# Patient Record
Sex: Female | Born: 1960 | Race: White | Hispanic: No | Smoking: Never smoker
Health system: Southern US, Community
[De-identification: ages and names within clinical notes are randomized; demographics above are authoritative.]

## PROBLEM LIST (undated history)

## (undated) DIAGNOSIS — Z862 Personal history of diseases of the blood and blood-forming organs and certain disorders involving the immune mechanism: Secondary | ICD-10-CM

## (undated) DIAGNOSIS — Z9889 Other specified postprocedural states: Secondary | ICD-10-CM

## (undated) DIAGNOSIS — R439 Unspecified disturbances of smell and taste: Secondary | ICD-10-CM

## (undated) DIAGNOSIS — F419 Anxiety disorder, unspecified: Secondary | ICD-10-CM

## (undated) DIAGNOSIS — E785 Hyperlipidemia, unspecified: Secondary | ICD-10-CM

## (undated) DIAGNOSIS — M545 Low back pain, unspecified: Secondary | ICD-10-CM

## (undated) DIAGNOSIS — G894 Chronic pain syndrome: Secondary | ICD-10-CM

## (undated) DIAGNOSIS — F329 Major depressive disorder, single episode, unspecified: Secondary | ICD-10-CM

## (undated) DIAGNOSIS — Z8781 Personal history of (healed) traumatic fracture: Secondary | ICD-10-CM

## (undated) DIAGNOSIS — F32A Depression, unspecified: Secondary | ICD-10-CM

## (undated) DIAGNOSIS — G4733 Obstructive sleep apnea (adult) (pediatric): Secondary | ICD-10-CM

## (undated) DIAGNOSIS — F4310 Post-traumatic stress disorder, unspecified: Secondary | ICD-10-CM

## (undated) DIAGNOSIS — G4730 Sleep apnea, unspecified: Secondary | ICD-10-CM

## (undated) DIAGNOSIS — K219 Gastro-esophageal reflux disease without esophagitis: Secondary | ICD-10-CM

## (undated) DIAGNOSIS — J309 Allergic rhinitis, unspecified: Secondary | ICD-10-CM

## (undated) DIAGNOSIS — Z8541 Personal history of malignant neoplasm of cervix uteri: Secondary | ICD-10-CM

## (undated) DIAGNOSIS — Z9989 Dependence on other enabling machines and devices: Secondary | ICD-10-CM

## (undated) DIAGNOSIS — R112 Nausea with vomiting, unspecified: Secondary | ICD-10-CM

## (undated) DIAGNOSIS — R5382 Chronic fatigue, unspecified: Secondary | ICD-10-CM

## (undated) DIAGNOSIS — M797 Fibromyalgia: Secondary | ICD-10-CM

## (undated) DIAGNOSIS — Z800 Family history of malignant neoplasm of digestive organs: Secondary | ICD-10-CM

## (undated) DIAGNOSIS — N938 Other specified abnormal uterine and vaginal bleeding: Secondary | ICD-10-CM

## (undated) DIAGNOSIS — G8929 Other chronic pain: Secondary | ICD-10-CM

## (undated) DIAGNOSIS — M25531 Pain in right wrist: Secondary | ICD-10-CM

## (undated) DIAGNOSIS — M1990 Unspecified osteoarthritis, unspecified site: Secondary | ICD-10-CM

## (undated) DIAGNOSIS — R499 Unspecified voice and resonance disorder: Secondary | ICD-10-CM

## (undated) DIAGNOSIS — E669 Obesity, unspecified: Secondary | ICD-10-CM

## (undated) DIAGNOSIS — G629 Polyneuropathy, unspecified: Secondary | ICD-10-CM

## (undated) DIAGNOSIS — I10 Essential (primary) hypertension: Secondary | ICD-10-CM

## (undated) DIAGNOSIS — N3941 Urge incontinence: Secondary | ICD-10-CM

## (undated) DIAGNOSIS — E8881 Metabolic syndrome: Secondary | ICD-10-CM

## (undated) DIAGNOSIS — G5601 Carpal tunnel syndrome, right upper limb: Secondary | ICD-10-CM

## (undated) DIAGNOSIS — D649 Anemia, unspecified: Secondary | ICD-10-CM

## (undated) DIAGNOSIS — R109 Unspecified abdominal pain: Secondary | ICD-10-CM

## (undated) DIAGNOSIS — C801 Malignant (primary) neoplasm, unspecified: Secondary | ICD-10-CM

## (undated) DIAGNOSIS — G43909 Migraine, unspecified, not intractable, without status migrainosus: Secondary | ICD-10-CM

## (undated) HISTORY — DX: Chronic fatigue, unspecified: R53.82

## (undated) HISTORY — DX: Unspecified voice and resonance disorder: R49.9

## (undated) HISTORY — DX: Malignant (primary) neoplasm, unspecified: C80.1

## (undated) HISTORY — DX: Metabolic syndrome: E88.81

## (undated) HISTORY — DX: Other chronic pain: G89.29

## (undated) HISTORY — DX: Polyneuropathy, unspecified: G62.9

## (undated) HISTORY — DX: Personal history of (healed) traumatic fracture: Z87.81

## (undated) HISTORY — DX: Essential (primary) hypertension: I10

## (undated) HISTORY — DX: Unspecified disturbances of smell and taste: R43.9

## (undated) HISTORY — DX: Major depressive disorder, single episode, unspecified: F32.9

## (undated) HISTORY — DX: Family history of malignant neoplasm of digestive organs: Z80.0

## (undated) HISTORY — DX: Low back pain, unspecified: M54.50

## (undated) HISTORY — DX: Dependence on other enabling machines and devices: Z99.89

## (undated) HISTORY — DX: Personal history of diseases of the blood and blood-forming organs and certain disorders involving the immune mechanism: Z86.2

## (undated) HISTORY — PX: OTHER SURGICAL HISTORY: SHX169

## (undated) HISTORY — DX: Obstructive sleep apnea (adult) (pediatric): G47.33

## (undated) HISTORY — DX: Other specified abnormal uterine and vaginal bleeding: N93.8

## (undated) HISTORY — DX: Low back pain: M54.5

## (undated) HISTORY — DX: Hyperlipidemia, unspecified: E78.5

## (undated) HISTORY — DX: Personal history of malignant neoplasm of cervix uteri: Z85.41

## (undated) HISTORY — DX: Depression, unspecified: F32.A

## (undated) HISTORY — PX: REPAIR QUADRICEPS / HAMSTRING MUSCLE: SUR1204

## (undated) HISTORY — PX: DIAGNOSTIC LAPAROSCOPY: SUR761

## (undated) HISTORY — DX: Allergic rhinitis, unspecified: J30.9

## (undated) HISTORY — PX: LUMBAR SPINE SURGERY: SHX701

## (undated) HISTORY — DX: Chronic pain syndrome: G89.4

## (undated) HISTORY — DX: Unspecified abdominal pain: R10.9

## (undated) HISTORY — PX: BACK SURGERY: SHX140

## (undated) HISTORY — DX: Obesity, unspecified: E66.9

## (undated) HISTORY — PX: WRIST SURGERY: SHX841

## (undated) HISTORY — DX: Gastro-esophageal reflux disease without esophagitis: K21.9

## (undated) HISTORY — DX: Fibromyalgia: M79.7

## (undated) HISTORY — PX: DILATION AND CURETTAGE OF UTERUS: SHX78

## (undated) HISTORY — DX: Migraine, unspecified, not intractable, without status migrainosus: G43.909

## (undated) HISTORY — DX: Sleep apnea, unspecified: G47.30

## (undated) HISTORY — DX: Pain in right wrist: M25.531

## (undated) HISTORY — DX: Urge incontinence: N39.41

## (undated) HISTORY — DX: Post-traumatic stress disorder, unspecified: F43.10

## (undated) HISTORY — DX: Anxiety disorder, unspecified: F41.9

## (undated) HISTORY — PX: UPPER GI ENDOSCOPY: SHX6162

---

## 1988-04-03 HISTORY — PX: KNEE ARTHROSCOPY: SUR90

## 1991-05-05 HISTORY — PX: CERVICAL CONE BIOPSY: SUR198

## 1997-05-04 HISTORY — PX: NASAL SEPTUM SURGERY: SHX37

## 1997-10-09 ENCOUNTER — Other Ambulatory Visit: Admission: RE | Admit: 1997-10-09 | Discharge: 1997-10-09 | Payer: Self-pay | Admitting: Otolaryngology

## 1998-11-18 ENCOUNTER — Ambulatory Visit (HOSPITAL_COMMUNITY): Admission: RE | Admit: 1998-11-18 | Discharge: 1998-11-18 | Payer: Self-pay | Admitting: Obstetrics and Gynecology

## 1999-03-18 ENCOUNTER — Other Ambulatory Visit: Admission: RE | Admit: 1999-03-18 | Discharge: 1999-03-18 | Payer: Self-pay | Admitting: Obstetrics and Gynecology

## 1999-11-25 ENCOUNTER — Other Ambulatory Visit: Admission: RE | Admit: 1999-11-25 | Discharge: 1999-11-25 | Payer: Self-pay | Admitting: Obstetrics and Gynecology

## 2000-05-31 ENCOUNTER — Other Ambulatory Visit: Admission: RE | Admit: 2000-05-31 | Discharge: 2000-05-31 | Payer: Self-pay | Admitting: *Deleted

## 2000-11-29 ENCOUNTER — Other Ambulatory Visit: Admission: RE | Admit: 2000-11-29 | Discharge: 2000-11-29 | Payer: Self-pay | Admitting: Obstetrics and Gynecology

## 2002-02-07 ENCOUNTER — Ambulatory Visit (HOSPITAL_BASED_OUTPATIENT_CLINIC_OR_DEPARTMENT_OTHER): Admission: RE | Admit: 2002-02-07 | Discharge: 2002-02-07 | Payer: Self-pay | Admitting: *Deleted

## 2002-02-07 ENCOUNTER — Encounter: Payer: Self-pay | Admitting: Pulmonary Disease

## 2002-06-20 ENCOUNTER — Other Ambulatory Visit: Admission: RE | Admit: 2002-06-20 | Discharge: 2002-06-20 | Payer: Self-pay | Admitting: Obstetrics and Gynecology

## 2003-05-30 ENCOUNTER — Ambulatory Visit (HOSPITAL_COMMUNITY): Admission: RE | Admit: 2003-05-30 | Discharge: 2003-05-30 | Payer: Self-pay | Admitting: Chiropractor

## 2003-06-14 ENCOUNTER — Encounter: Payer: Self-pay | Admitting: Internal Medicine

## 2003-09-02 HISTORY — PX: MICRODISCECTOMY LUMBAR: SUR864

## 2003-09-06 ENCOUNTER — Ambulatory Visit (HOSPITAL_COMMUNITY): Admission: RE | Admit: 2003-09-06 | Discharge: 2003-09-07 | Payer: Self-pay | Admitting: Orthopaedic Surgery

## 2004-05-04 HISTORY — PX: OTHER SURGICAL HISTORY: SHX169

## 2004-12-04 ENCOUNTER — Ambulatory Visit: Payer: Self-pay | Admitting: Internal Medicine

## 2004-12-17 ENCOUNTER — Ambulatory Visit: Payer: Self-pay | Admitting: Internal Medicine

## 2005-02-11 ENCOUNTER — Ambulatory Visit: Payer: Self-pay | Admitting: Internal Medicine

## 2005-03-10 ENCOUNTER — Ambulatory Visit: Payer: Self-pay | Admitting: Internal Medicine

## 2005-03-12 ENCOUNTER — Encounter (INDEPENDENT_AMBULATORY_CARE_PROVIDER_SITE_OTHER): Payer: Self-pay | Admitting: Specialist

## 2005-03-12 ENCOUNTER — Ambulatory Visit (HOSPITAL_COMMUNITY): Admission: RE | Admit: 2005-03-12 | Discharge: 2005-03-12 | Payer: Self-pay | Admitting: Surgery

## 2005-04-21 ENCOUNTER — Ambulatory Visit: Payer: Self-pay | Admitting: Internal Medicine

## 2005-05-04 HISTORY — PX: ANAL SPHINCTEROTOMY: SHX1140

## 2005-07-10 ENCOUNTER — Ambulatory Visit (HOSPITAL_COMMUNITY): Admission: RE | Admit: 2005-07-10 | Discharge: 2005-07-11 | Payer: Self-pay | Admitting: Surgery

## 2005-09-14 ENCOUNTER — Ambulatory Visit: Payer: Self-pay | Admitting: Gastroenterology

## 2005-10-05 ENCOUNTER — Ambulatory Visit: Payer: Self-pay | Admitting: Gastroenterology

## 2005-11-06 ENCOUNTER — Ambulatory Visit: Payer: Self-pay | Admitting: Gastroenterology

## 2006-01-22 ENCOUNTER — Ambulatory Visit: Payer: Self-pay | Admitting: Internal Medicine

## 2006-02-26 ENCOUNTER — Ambulatory Visit: Payer: Self-pay | Admitting: Internal Medicine

## 2006-03-04 ENCOUNTER — Ambulatory Visit: Payer: Self-pay | Admitting: Internal Medicine

## 2006-03-04 LAB — CONVERTED CEMR LAB
Basophils Absolute: 0.1 10*3/uL (ref 0.0–0.1)
Basophils Relative: 1.2 % — ABNORMAL HIGH (ref 0.0–1.0)
Eosinophil percent: 2.1 % (ref 0.0–5.0)
Free T4: 0.8 ng/dL — ABNORMAL LOW (ref 0.9–1.8)
HCT: 40.1 % (ref 36.0–46.0)
Hemoglobin: 13.4 g/dL (ref 12.0–15.0)
Hgb A1c MFr Bld: 5.1 % (ref 4.6–6.0)
Lymphocytes Relative: 27.1 % (ref 12.0–46.0)
MCHC: 33.3 g/dL (ref 30.0–36.0)
MCV: 85 fL (ref 78.0–100.0)
Monocytes Absolute: 0.6 10*3/uL (ref 0.2–0.7)
Monocytes Relative: 7.7 % (ref 3.0–11.0)
Neutro Abs: 4.3 10*3/uL (ref 1.4–7.7)
Neutrophils Relative %: 61.9 % (ref 43.0–77.0)
Platelets: 383 10*3/uL (ref 150–400)
RBC: 4.72 M/uL (ref 3.87–5.11)
RDW: 13.2 % (ref 11.5–14.6)
T3, Free: 3.3 pg/mL (ref 2.3–4.2)
TSH: 0.62 microintl units/mL (ref 0.35–5.50)
Vitamin B-12: 386 pg/mL (ref 211–911)
WBC: 7.2 10*3/uL (ref 4.5–10.5)

## 2006-09-09 ENCOUNTER — Encounter: Payer: Self-pay | Admitting: Internal Medicine

## 2006-09-09 ENCOUNTER — Ambulatory Visit: Payer: Self-pay | Admitting: Internal Medicine

## 2006-09-09 LAB — CONVERTED CEMR LAB: Rapid Strep: POSITIVE

## 2007-06-30 ENCOUNTER — Ambulatory Visit: Payer: Self-pay | Admitting: Internal Medicine

## 2007-09-23 ENCOUNTER — Telehealth (INDEPENDENT_AMBULATORY_CARE_PROVIDER_SITE_OTHER): Payer: Self-pay | Admitting: *Deleted

## 2007-09-23 ENCOUNTER — Ambulatory Visit: Payer: Self-pay | Admitting: Internal Medicine

## 2007-09-23 DIAGNOSIS — K219 Gastro-esophageal reflux disease without esophagitis: Secondary | ICD-10-CM | POA: Insufficient documentation

## 2007-09-23 DIAGNOSIS — I10 Essential (primary) hypertension: Secondary | ICD-10-CM | POA: Insufficient documentation

## 2007-09-27 ENCOUNTER — Encounter (INDEPENDENT_AMBULATORY_CARE_PROVIDER_SITE_OTHER): Payer: Self-pay | Admitting: *Deleted

## 2007-10-04 ENCOUNTER — Encounter: Payer: Self-pay | Admitting: Internal Medicine

## 2007-11-11 ENCOUNTER — Ambulatory Visit: Payer: Self-pay | Admitting: Internal Medicine

## 2008-03-04 HISTORY — PX: COMBINED HYSTEROSCOPY DIAGNOSTIC / D&C: SUR297

## 2008-03-23 ENCOUNTER — Ambulatory Visit (HOSPITAL_COMMUNITY): Admission: RE | Admit: 2008-03-23 | Discharge: 2008-03-23 | Payer: Self-pay | Admitting: Obstetrics and Gynecology

## 2008-03-23 ENCOUNTER — Encounter (INDEPENDENT_AMBULATORY_CARE_PROVIDER_SITE_OTHER): Payer: Self-pay | Admitting: Obstetrics and Gynecology

## 2008-07-05 ENCOUNTER — Encounter: Payer: Self-pay | Admitting: Internal Medicine

## 2008-08-14 ENCOUNTER — Encounter: Payer: Self-pay | Admitting: Internal Medicine

## 2008-08-14 DIAGNOSIS — R0989 Other specified symptoms and signs involving the circulatory and respiratory systems: Secondary | ICD-10-CM | POA: Insufficient documentation

## 2008-08-14 DIAGNOSIS — R0609 Other forms of dyspnea: Secondary | ICD-10-CM | POA: Insufficient documentation

## 2008-08-15 ENCOUNTER — Encounter (INDEPENDENT_AMBULATORY_CARE_PROVIDER_SITE_OTHER): Payer: Self-pay | Admitting: *Deleted

## 2008-08-20 ENCOUNTER — Telehealth (INDEPENDENT_AMBULATORY_CARE_PROVIDER_SITE_OTHER): Payer: Self-pay | Admitting: *Deleted

## 2008-08-22 ENCOUNTER — Encounter: Payer: Self-pay | Admitting: Internal Medicine

## 2008-08-29 ENCOUNTER — Ambulatory Visit: Payer: Self-pay | Admitting: Pulmonary Disease

## 2008-08-29 DIAGNOSIS — G4710 Hypersomnia, unspecified: Secondary | ICD-10-CM | POA: Insufficient documentation

## 2008-08-29 DIAGNOSIS — Q791 Other congenital malformations of diaphragm: Secondary | ICD-10-CM | POA: Insufficient documentation

## 2008-08-29 DIAGNOSIS — R0602 Shortness of breath: Secondary | ICD-10-CM | POA: Insufficient documentation

## 2008-08-29 DIAGNOSIS — G4730 Sleep apnea, unspecified: Secondary | ICD-10-CM

## 2008-08-29 DIAGNOSIS — J309 Allergic rhinitis, unspecified: Secondary | ICD-10-CM | POA: Insufficient documentation

## 2008-09-17 ENCOUNTER — Ambulatory Visit (HOSPITAL_BASED_OUTPATIENT_CLINIC_OR_DEPARTMENT_OTHER): Admission: RE | Admit: 2008-09-17 | Discharge: 2008-09-17 | Payer: Self-pay | Admitting: Pulmonary Disease

## 2008-09-17 ENCOUNTER — Encounter: Payer: Self-pay | Admitting: Pulmonary Disease

## 2008-09-25 ENCOUNTER — Ambulatory Visit: Payer: Self-pay | Admitting: Pulmonary Disease

## 2008-10-02 ENCOUNTER — Ambulatory Visit: Payer: Self-pay | Admitting: Pulmonary Disease

## 2008-10-05 ENCOUNTER — Telehealth: Payer: Self-pay | Admitting: Pulmonary Disease

## 2008-10-09 ENCOUNTER — Telehealth: Payer: Self-pay | Admitting: Pulmonary Disease

## 2008-10-15 ENCOUNTER — Encounter: Payer: Self-pay | Admitting: Pulmonary Disease

## 2008-10-30 ENCOUNTER — Encounter: Payer: Self-pay | Admitting: Pulmonary Disease

## 2008-11-14 ENCOUNTER — Telehealth (INDEPENDENT_AMBULATORY_CARE_PROVIDER_SITE_OTHER): Payer: Self-pay | Admitting: *Deleted

## 2008-11-16 ENCOUNTER — Telehealth (INDEPENDENT_AMBULATORY_CARE_PROVIDER_SITE_OTHER): Payer: Self-pay | Admitting: *Deleted

## 2008-11-26 ENCOUNTER — Ambulatory Visit: Payer: Self-pay | Admitting: Internal Medicine

## 2008-11-26 DIAGNOSIS — E8881 Metabolic syndrome: Secondary | ICD-10-CM

## 2008-11-26 DIAGNOSIS — M79609 Pain in unspecified limb: Secondary | ICD-10-CM | POA: Insufficient documentation

## 2008-11-26 DIAGNOSIS — E785 Hyperlipidemia, unspecified: Secondary | ICD-10-CM | POA: Insufficient documentation

## 2008-11-26 DIAGNOSIS — R946 Abnormal results of thyroid function studies: Secondary | ICD-10-CM | POA: Insufficient documentation

## 2008-11-26 HISTORY — DX: Metabolic syndrome: E88.810

## 2008-11-26 HISTORY — DX: Metabolic syndrome: E88.81

## 2008-11-26 LAB — CONVERTED CEMR LAB
ALT: 17 units/L (ref 0–35)
AST: 19 units/L (ref 0–37)
Albumin: 3.6 g/dL (ref 3.5–5.2)
Alkaline Phosphatase: 67 units/L (ref 39–117)
BUN: 18 mg/dL (ref 6–23)
Basophils Absolute: 0.1 10*3/uL (ref 0.0–0.1)
Basophils Relative: 1.2 % (ref 0.0–3.0)
Bilirubin, Direct: 0.1 mg/dL (ref 0.0–0.3)
CO2: 27 meq/L (ref 19–32)
Calcium: 8.7 mg/dL (ref 8.4–10.5)
Chloride: 105 meq/L (ref 96–112)
Cholesterol, target level: 200 mg/dL
Creatinine, Ser: 0.9 mg/dL (ref 0.4–1.2)
Eosinophils Absolute: 0.1 10*3/uL (ref 0.0–0.7)
Eosinophils Relative: 1.4 % (ref 0.0–5.0)
Free T4: 0.8 ng/dL (ref 0.6–1.6)
GFR calc non Af Amer: 71.77 mL/min (ref >60)
Glucose, Bld: 92 mg/dL (ref 70–99)
HCT: 37.3 % (ref 36.0–46.0)
HDL goal, serum: 40 mg/dL
Hemoglobin: 12.5 g/dL (ref 12.0–15.0)
Hgb A1c MFr Bld: 5.5 % (ref 4.6–6.5)
LDL Goal: 120 mg/dL
Lymphocytes Relative: 26.2 % (ref 12.0–46.0)
Lymphs Abs: 2.1 10*3/uL (ref 0.7–4.0)
MCHC: 33.4 g/dL (ref 30.0–36.0)
MCV: 78.1 fL (ref 78.0–100.0)
Monocytes Absolute: 0.7 10*3/uL (ref 0.1–1.0)
Monocytes Relative: 8.2 % (ref 3.0–12.0)
Neutro Abs: 5.1 10*3/uL (ref 1.4–7.7)
Neutrophils Relative %: 63 % (ref 43.0–77.0)
Platelets: 338 10*3/uL (ref 150.0–400.0)
Potassium: 4.2 meq/L (ref 3.5–5.1)
RBC: 4.77 M/uL (ref 3.87–5.11)
RDW: 14.7 % — ABNORMAL HIGH (ref 11.5–14.6)
Sed Rate: 19 mm/hr (ref 0–22)
Sodium: 138 meq/L (ref 135–145)
T3, Free: 3.1 pg/mL (ref 2.3–4.2)
TSH: 0.77 microintl units/mL (ref 0.35–5.50)
Total Bilirubin: 0.9 mg/dL (ref 0.3–1.2)
Total Protein: 6.9 g/dL (ref 6.0–8.3)
WBC: 8.1 10*3/uL (ref 4.5–10.5)

## 2008-11-27 ENCOUNTER — Encounter (INDEPENDENT_AMBULATORY_CARE_PROVIDER_SITE_OTHER): Payer: Self-pay | Admitting: *Deleted

## 2008-11-27 ENCOUNTER — Encounter: Payer: Self-pay | Admitting: Internal Medicine

## 2008-11-29 ENCOUNTER — Encounter (INDEPENDENT_AMBULATORY_CARE_PROVIDER_SITE_OTHER): Payer: Self-pay | Admitting: *Deleted

## 2008-11-29 ENCOUNTER — Ambulatory Visit: Payer: Self-pay | Admitting: Internal Medicine

## 2009-01-11 ENCOUNTER — Telehealth (INDEPENDENT_AMBULATORY_CARE_PROVIDER_SITE_OTHER): Payer: Self-pay | Admitting: *Deleted

## 2009-01-15 ENCOUNTER — Telehealth: Payer: Self-pay | Admitting: Pulmonary Disease

## 2009-02-01 HISTORY — PX: PLANTAR FASCIA RELEASE: SHX2239

## 2009-02-12 ENCOUNTER — Encounter: Payer: Self-pay | Admitting: Pulmonary Disease

## 2009-02-27 ENCOUNTER — Ambulatory Visit (HOSPITAL_BASED_OUTPATIENT_CLINIC_OR_DEPARTMENT_OTHER): Admission: RE | Admit: 2009-02-27 | Discharge: 2009-02-28 | Payer: Self-pay | Admitting: Orthopedic Surgery

## 2009-03-25 ENCOUNTER — Ambulatory Visit: Payer: Self-pay | Admitting: Internal Medicine

## 2009-04-08 ENCOUNTER — Ambulatory Visit: Payer: Self-pay | Admitting: Pulmonary Disease

## 2009-05-08 ENCOUNTER — Encounter: Payer: Self-pay | Admitting: Pulmonary Disease

## 2009-05-30 ENCOUNTER — Encounter: Payer: Self-pay | Admitting: Pulmonary Disease

## 2009-08-02 ENCOUNTER — Ambulatory Visit: Payer: Self-pay | Admitting: Family

## 2009-08-08 ENCOUNTER — Ambulatory Visit: Payer: Self-pay | Admitting: Internal Medicine

## 2009-08-08 DIAGNOSIS — L02419 Cutaneous abscess of limb, unspecified: Secondary | ICD-10-CM | POA: Insufficient documentation

## 2009-08-08 DIAGNOSIS — L03119 Cellulitis of unspecified part of limb: Secondary | ICD-10-CM

## 2009-08-08 LAB — CONVERTED CEMR LAB
Bilirubin Urine: NEGATIVE
Blood in Urine, dipstick: NEGATIVE
Glucose, Urine, Semiquant: NEGATIVE
Ketones, urine, test strip: NEGATIVE
Nitrite: NEGATIVE
Protein, U semiquant: NEGATIVE
Specific Gravity, Urine: 1.02
Urobilinogen, UA: 0.2
WBC Urine, dipstick: NEGATIVE
pH: 6

## 2009-08-12 ENCOUNTER — Ambulatory Visit: Payer: Self-pay | Admitting: Family Medicine

## 2009-08-12 ENCOUNTER — Telehealth (INDEPENDENT_AMBULATORY_CARE_PROVIDER_SITE_OTHER): Payer: Self-pay | Admitting: *Deleted

## 2009-08-16 ENCOUNTER — Ambulatory Visit: Payer: Self-pay | Admitting: Internal Medicine

## 2009-08-16 DIAGNOSIS — S62109A Fracture of unspecified carpal bone, unspecified wrist, initial encounter for closed fracture: Secondary | ICD-10-CM | POA: Insufficient documentation

## 2009-12-18 ENCOUNTER — Telehealth (INDEPENDENT_AMBULATORY_CARE_PROVIDER_SITE_OTHER): Payer: Self-pay | Admitting: *Deleted

## 2009-12-25 ENCOUNTER — Ambulatory Visit: Payer: Self-pay | Admitting: Internal Medicine

## 2009-12-27 ENCOUNTER — Telehealth (INDEPENDENT_AMBULATORY_CARE_PROVIDER_SITE_OTHER): Payer: Self-pay | Admitting: *Deleted

## 2009-12-27 LAB — CONVERTED CEMR LAB
ALT: 18 units/L (ref 0–35)
AST: 23 units/L (ref 0–37)
Albumin: 3.7 g/dL (ref 3.5–5.2)
Alkaline Phosphatase: 88 units/L (ref 39–117)
BUN: 18 mg/dL (ref 6–23)
Basophils Absolute: 0.1 10*3/uL (ref 0.0–0.1)
Basophils Relative: 0.8 % (ref 0.0–3.0)
Bilirubin, Direct: 0.1 mg/dL (ref 0.0–0.3)
CO2: 23 meq/L (ref 19–32)
Calcium: 9.2 mg/dL (ref 8.4–10.5)
Chloride: 107 meq/L (ref 96–112)
Cholesterol: 223 mg/dL — ABNORMAL HIGH (ref 0–200)
Creatinine, Ser: 0.8 mg/dL (ref 0.4–1.2)
Direct LDL: 136.7 mg/dL
Eosinophils Absolute: 0.2 10*3/uL (ref 0.0–0.7)
Eosinophils Relative: 3 % (ref 0.0–5.0)
GFR calc non Af Amer: 83.03 mL/min (ref >60)
Glucose, Bld: 97 mg/dL (ref 70–99)
HCT: 34.8 % — ABNORMAL LOW (ref 36.0–46.0)
HDL: 42 mg/dL (ref >39.00)
Hemoglobin: 11.7 g/dL — ABNORMAL LOW (ref 12.0–15.0)
Hgb A1c MFr Bld: 5.7 % (ref 4.6–6.5)
Lymphocytes Relative: 38 % (ref 12.0–46.0)
Lymphs Abs: 2.6 10*3/uL (ref 0.7–4.0)
MCHC: 33.6 g/dL (ref 30.0–36.0)
MCV: 80.4 fL (ref 78.0–100.0)
Monocytes Absolute: 0.7 10*3/uL (ref 0.1–1.0)
Monocytes Relative: 10.3 % (ref 3.0–12.0)
Neutro Abs: 3.3 10*3/uL (ref 1.4–7.7)
Neutrophils Relative %: 47.9 % (ref 43.0–77.0)
Platelets: 333 10*3/uL (ref 150.0–400.0)
Potassium: 4.2 meq/L (ref 3.5–5.1)
RBC: 4.33 M/uL (ref 3.87–5.11)
RDW: 16.1 % — ABNORMAL HIGH (ref 11.5–14.6)
Sodium: 139 meq/L (ref 135–145)
TSH: 0.51 microintl units/mL (ref 0.35–5.50)
Total Bilirubin: 0.4 mg/dL (ref 0.3–1.2)
Total CHOL/HDL Ratio: 5
Total Protein: 6.3 g/dL (ref 6.0–8.3)
Triglycerides: 394 mg/dL — ABNORMAL HIGH (ref 0.0–149.0)
VLDL: 78.8 mg/dL — ABNORMAL HIGH (ref 0.0–40.0)
WBC: 6.9 10*3/uL (ref 4.5–10.5)

## 2009-12-31 ENCOUNTER — Ambulatory Visit: Payer: Self-pay | Admitting: Internal Medicine

## 2009-12-31 DIAGNOSIS — H547 Unspecified visual loss: Secondary | ICD-10-CM | POA: Insufficient documentation

## 2009-12-31 DIAGNOSIS — D649 Anemia, unspecified: Secondary | ICD-10-CM | POA: Insufficient documentation

## 2010-01-01 LAB — CONVERTED CEMR LAB
Folate: 9 ng/mL
Iron: 47 ug/dL (ref 42–145)
Saturation Ratios: 10 % — ABNORMAL LOW (ref 20.0–50.0)
Transferrin: 336.4 mg/dL (ref 212.0–360.0)
Vitamin B-12: 702 pg/mL (ref 211–911)

## 2010-01-30 ENCOUNTER — Encounter: Payer: Self-pay | Admitting: Internal Medicine

## 2010-03-17 ENCOUNTER — Ambulatory Visit: Payer: Self-pay | Admitting: Internal Medicine

## 2010-04-02 ENCOUNTER — Ambulatory Visit: Payer: Self-pay | Admitting: Internal Medicine

## 2010-04-07 LAB — CONVERTED CEMR LAB
ALT: 20 units/L (ref 0–35)
AST: 26 units/L (ref 0–37)
Albumin: 4.3 g/dL (ref 3.5–5.2)
Alkaline Phosphatase: 80 units/L (ref 39–117)
Basophils Absolute: 0.1 10*3/uL (ref 0.0–0.1)
Basophils Relative: 0.8 % (ref 0.0–3.0)
Bilirubin, Direct: 0.1 mg/dL (ref 0.0–0.3)
Cholesterol: 271 mg/dL — ABNORMAL HIGH (ref 0–200)
Direct LDL: 201.5 mg/dL
Eosinophils Absolute: 0.1 10*3/uL (ref 0.0–0.7)
Eosinophils Relative: 1.8 % (ref 0.0–5.0)
HCT: 39.5 % (ref 36.0–46.0)
HDL: 44.8 mg/dL (ref >39.00)
Hemoglobin: 13.3 g/dL (ref 12.0–15.0)
Lymphocytes Relative: 43.5 % (ref 12.0–46.0)
Lymphs Abs: 3 10*3/uL (ref 0.7–4.0)
MCHC: 33.8 g/dL (ref 30.0–36.0)
MCV: 82.1 fL (ref 78.0–100.0)
Monocytes Absolute: 0.9 10*3/uL (ref 0.1–1.0)
Monocytes Relative: 12.4 % — ABNORMAL HIGH (ref 3.0–12.0)
Neutro Abs: 2.9 10*3/uL (ref 1.4–7.7)
Neutrophils Relative %: 41.5 % — ABNORMAL LOW (ref 43.0–77.0)
Platelets: 392 10*3/uL (ref 150.0–400.0)
RBC: 4.81 M/uL (ref 3.87–5.11)
RDW: 15.5 % — ABNORMAL HIGH (ref 11.5–14.6)
Total Bilirubin: 0.3 mg/dL (ref 0.3–1.2)
Total CHOL/HDL Ratio: 6
Total Protein: 7.2 g/dL (ref 6.0–8.3)
Triglycerides: 131 mg/dL (ref 0.0–149.0)
VLDL: 26.2 mg/dL (ref 0.0–40.0)
WBC: 6.9 10*3/uL (ref 4.5–10.5)

## 2010-04-08 ENCOUNTER — Ambulatory Visit: Payer: Self-pay | Admitting: Internal Medicine

## 2010-04-10 ENCOUNTER — Encounter: Payer: Self-pay | Admitting: Internal Medicine

## 2010-04-10 ENCOUNTER — Ambulatory Visit: Payer: Self-pay | Admitting: Internal Medicine

## 2010-04-14 ENCOUNTER — Telehealth: Payer: Self-pay | Admitting: Internal Medicine

## 2010-04-15 ENCOUNTER — Encounter: Payer: Self-pay | Admitting: Internal Medicine

## 2010-06-05 NOTE — Progress Notes (Signed)
Summary: question about wound  Phone Note Call from Patient Call back at Home Phone [phone]   Caller: Patient Call For: Marga Melnick MD Summary of Call: Leg wound open up over the weekend and need advice on how to care for it.  Saw Dr. Alwyn Ren last week. Initial call taken by: Barnie Mort,  August 12, 2009 9:31 AM  Follow-up for Phone Call        Spoke with pt who says started warm compresses on thigh friday, -warm compresses has opened p the would now thick drainage with blood, OV scheduled .Kandice Hams  August 12, 2009 10:20 AM  Follow-up by: Kandice Hams,  August 12, 2009 10:20 AM

## 2010-06-05 NOTE — Assessment & Plan Note (Signed)
Summary: bp check and bmet/kdc  Nurse Visit   Vital Signs:  Patient profile:   50 year old female Weight:      217 pounds Temp:     98.1 degrees F oral Pulse rate:   82 / minute Resp:     15 per minute BP sitting:   122 / 72  (left arm)  Vitals Entered By: Jeremy Johann CMA (August 16, 2009 4:34 PM)  Primary Care Provider:  Dr. Alwyn Ren  CC:  bp check, bmet, and Hypertension Management.  History of Present Illness: She fractured her L wrist 08/01/2009 when her wrist was twisted by student was falling & pulled her down.She was [facility]13/[facility]. She is still premenopausal ; LMP 2 weeks agoShe is not on Ca++ or vitamin Dsupplements..Her father has severe osteoporosis.                                               BP not checked @ home.  Hypertension History:      She complains of visual symptoms and neurologic problems, but denies headache, chest pain, palpitations, dyspnea with exertion, orthopnea, PND, peripheral edema, syncope, and side effects from treatment.        Positive major cardiovascular risk factors include hyperlipidemia and hypertension.  Negative major cardiovascular risk factors include female age less than 67 years old, no history of diabetes, negative family history for ischemic heart disease, and non-tobacco-user status.        Further assessment for target organ damage reveals no history of ASHD, stroke/TIA, or peripheral vascular disease.     Past History:  Past Medical History: GERD Hypertension Allergic Rhinitis cervical cancer, PMH of , Dr Rosalio Macadamia Hyperlipidemia abnormal TFTs (794.5); Sleep Apnea, stable on CPAP    Review of Systems Eyes:  Complains of blurring; denies double vision and vision loss-both eyes; Eye exams have been normal. Neuro:  Complains of numbness and tingling; Occasional N&T RUE positionally.   Physical Exam  General:  well-nourished; alert,appropriate and cooperative throughout examination Neck:  No  deformities, masses, or tenderness noted. Lungs:  Normal respiratory effort, chest expands symmetrically. Lungs are clear to auscultation, no crackles or wheezes. Heart:  normal rate, regular rhythm, no gallop, no rub, no JVD, and grade 1 /6 systolic murmur.   Abdomen:  Bowel sounds positive,abdomen soft and non-tender without masses, organomegaly or hernias noted. No bruits or AAA Pulses:  R and L carotid, R radial,dorsalis pedis and posterior tibial pulses are full and equal bilaterally. Note: L radial not checked due to splint Extremities:  No clubbing, cyanosis, edema.Negative Tinel's RUE Neurologic:  alert & oriented X3 and DTRs symmetrical and normal.   Skin:  Intact without suspicious lesions or rashes Psych:  memory intact for recent and remote, normally interactive, and good eye contact.      Impression & Recommendations:  Problem # 1:  HYPERTENSION, ESSENTIAL NOS (ICD-401.9) controlled Her updated medication list for this problem includes:    Hyzaar 100-25 Mg Tabs (Losartan potassium-hctz) ..... One tablet by mouth daily  Problem # 2:  FRACTURE, WRIST, LEFT (ICD-814.00) R/O underlying Osteopenia  Complete Medication List: 1)  Prilosec 20 Mg Cpdr (Omeprazole) .... One by mouth two times a day 2)  Effexor Xr 75 Mg Xr24h-cap (Venlafaxine hcl) .... 3 by mouth once daily 3)  Abilify 5 Mg Tabs (Aripiprazole) .Marland KitchenMarland KitchenMarland Kitchen  Once daily 4)  Aleve 220 Mg Tabs (Naproxen sodium) .... Take 2 tablet by mouth two times a day 5)  Hyzaar 100-25 Mg Tabs (Losartan potassium-hctz) .... One tablet by mouth daily 6)  Phentermine Hcl 37.5 Mg Caps (Phentermine hcl) .[name].. 1 by mouth once daily 7)  Doxycycline Hyclate 100 Mg Caps (Doxycycline hyclate) .... Take 1 tab twice a day w/ food.  Hypertension Assessment/Plan:      The patient's hypertensive risk group is category B: At least one risk factor (excluding diabetes) with no target organ damage.  Today's blood pressure is 122/72.     Patient  Instructions: 1)  Calcium 600 mg two times a day & vitamin D 1000 International Units once daily .Ask Orthopedist if Osteopenia suspect based on wrist films. 2)  Check your Blood Pressure regularly.Your BP goal = < 135/85 ON AVERAGE. A fasting BMET is recommended in 4 weeks(401.9)  CC: bp check, bmet, Hypertension Management Comments REVIEWED MED LIST, PATIENT AGREED DOSE AND INSTRUCTION CORRECT    Allergies: 1)  ! Vicodin  Orders Added: 1)  Est. Patient Level III [16109]  Appended Document: bp check and bmet/kdc    Error, see eRx request dated 10/14/09.  Mervin Kung CMA  October 14, 2009 12:53 PM

## 2010-06-05 NOTE — Assessment & Plan Note (Signed)
Summary: cough/cbs   Vital Signs:  Patient profile:   50 year old female Weight:      180.6 pounds BMI:     31.85 Temp:     98.7 degrees F oral Pulse rate:   96 / minute Resp:     14 per minute BP sitting:   124 / 86  (left arm) Cuff size:   large  Vitals Entered By: Shonna Chock CMA (March 17, 2010 3:56 PM) CC: Cough and discuss protonix, URI symptoms   Primary Care Provider:  Dr. Alwyn Ren  CC:  Cough and discuss protonix and URI symptoms.  History of Present Illness: RTI  Symptoms      This is a 50 year old woman who presents with RTI  symptoms since 11/04, initially as ST & drainage. This improved but  11/10 productive cough & laryngitis appeared.  The patient reports nasal congestion, purulent nasal discharge, sore throat, and productive cough, but denies earache.  Associated symptoms include dyspnea on exertion.  The patient denies fever and wheezing but she has had chills.  The patient also reports headache.  Risk factors for Strep sinusitis include bilateral facial pain.  The patient denies the following risk factors for Strep sinusitis: tooth pain and tender adenopathy.  Rx: Tylenol, NSAIDS  Allergies: 1)  ! Vicodin  Physical Exam  General:  well-nourished,in no acute distress; alert,appropriate and cooperative throughout examination Ears:  External ear exam shows no significant lesions or deformities.  Otoscopic examination reveals clear canals, tympanic membranes are intact bilaterally without bulging, retraction, inflammation or discharge. Hearing is grossly normal bilaterally. Nose:  External nasal examination shows no deformity or inflammation. Nasal mucosa are pink and moist without lesions or exudates. Mouth:  Oral mucosa and oropharynx without lesions or exudates.  Teeth in good repair. Hoarse Lungs:  Normal respiratory effort, chest expands symmetrically. Lungs are clear to auscultation, no crackles or wheezes. Skin:  Intact without suspicious lesions or rashes;  slightly diaphoretic Cervical Nodes:  No lymphadenopathy noted Axillary Nodes:  No palpable lymphadenopathy   Impression & Recommendations:  Problem # 1:  SINUSITIS- ACUTE-NOS (ICD-461.9)  Her updated medication list for this problem includes:    Amoxicillin-pot Clavulanate 875-125 Mg Tabs (Amoxicillin-pot clavulanate) .[name]... 1 every 12 hrs with a meal  Problem # 2:  BRONCHITIS-ACUTE (ICD-466.0)  Her updated medication list for this problem includes:    Amoxicillin-pot Clavulanate 875-125 Mg Tabs (Amoxicillin-pot clavulanate) .[name]... 1 every 12 hrs with a meal  Complete Medication List: 1)  Prilosec 20 Mg Cpdr (Omeprazole) .... One by mouth 30 min pre b'fast as needed 2)  Effexor Xr 150 Mg Xr24h-cap (Venlafaxine hcl) .[name].. 1 by mouth once daily 3)  Hyzaar 100-25 Mg Tabs (Losartan potassium-hctz) .... One tablet by mouth daily 4)  Vyvanse 70 Mg Caps (Lisdexamfetamine dimesylate) .[name].. 1 by mouth once daily 5)  Topiramate 25 Mg Tabs (Topiramate) .[name].. 1 by mouth once daily 6)  Amoxicillin-pot Clavulanate 875-125 Mg Tabs (Amoxicillin-pot clavulanate) .[name].. 1 every 12 hrs with a meal  Patient Instructions: 1)  Neti pot once daily until sinuses are clear.Mucinex DM as needed for cough.Drink as much NON dairy  fluid as you can tolerate for the next few days. Prescriptions: PRILOSEC 20 MG  CPDR (OMEPRAZOLE) one by mouth 30 min pre b'fast as needed  #30 x 5   Entered and Authorized by:   Marga Melnick MD   Signed by:   Marga Melnick MD on 03/17/2010   Method used:   Print  then Give to Patient   RxID:   [id number] AMOXICILLIN-POT CLAVULANATE 875-125 MG TABS (AMOXICILLIN-POT CLAVULANATE) 1 every 12 hrs with a meal  #20 x 0   Entered and Authorized by:   Marga Melnick MD   Signed by:   Marga Melnick MD on 03/17/2010   Method used:   [facility] to ...       [facility]  [address] [phone]* (retail)       [address] [place], [place]  [address]       Ph: [phone] or [phone]        Fax: [phone]   RxID:   [id number]    Orders Added: 1)  Est. Patient Level III [88416]

## 2010-06-05 NOTE — Assessment & Plan Note (Signed)
Summary: BLD PRESSURE RUNNING HIGH/RH.......Sabrina [name]   Vital Signs:  Patient profile:   50 year old female Height:      63.25 inches Weight:      215.03 pounds BMI:     37.93 Temp:     98.1 degrees F oral Pulse rate:   102 / minute Pulse rhythm:   regular Resp:     16 per minute BP sitting:   136 / 90  (left arm) Cuff size:   large  Vitals Entered By: Mervin Kung CMA (August 02, 2009 11:30 AM) CC: room 14  Pt. states she has been fatigued. Went to school nurse yesterday and BP was 102 diastolic.   Primary Care Provider:  Dr. Alwyn Ren  CC:  room 14  Pt. states she has been fatigued. Went to school nurse yesterday and BP was 102 diastolic.Sabrina [name]  History of Present Illness: Sabrina [name] is a 50 year old female who presents today with c/o uncontrolled BP.  She notes that she checked her BP at [facility] and it was high (162/102).  Then had school nurse check her blood pressure yesterday and she noted that her diastolic blood pressure was 102 on left are and 96 on the right arm- pt cannot recall systolic blood pressure.  She notes some mild headaches but denies any lower extremity edema.    Allergies: 1)  ! Vicodin  Physical Exam  General:  Well-developed,well-nourished,in no acute distress; alert,appropriate and cooperative throughout examination Lungs:  Normal respiratory effort, chest expands symmetrically. Lungs are clear to auscultation, no crackles or wheezes. Heart:  Normal rate and regular rhythm. S1 and S2 normal without gallop, murmur, click, rub or other extra sounds.   Impression & Recommendations:  Problem # 1:  HYPERTENSION, ESSENTIAL NOS (ICD-401.9) Assessment Deteriorated Will change Hyzaar to Cozaar.  Plan to have patient follow up in 2 weeks for BMET and follow up BP check.  She is also due to follow up with Dr. Alwyn Ren.   Her updated medication list for this problem includes:    Cozaar 100 Mg Tabs (Losartan potassium) .Sabrina [name]... 1 once daily    Hyzaar 100-25 Mg Tabs (Losartan  potassium-hctz) ..... One tablet by mouth daily  BP today: 136/90 Prior BP: 124/82 (04/08/2009)  Prior 10 Yr Risk Heart Disease: Not enough information (11/26/2008)  Labs Reviewed: K+: 4.2 (11/26/2008) Creat: : 0.9 (11/26/2008)     Complete Medication List: 1)  Prilosec 20 Mg Cpdr (Omeprazole) .... One by mouth two times a day 2)  Effexor Xr 75 Mg Xr24h-cap (Venlafaxine hcl) .... 3 by mouth once daily 3)  Vyvanse 70 Mg Caps (Lisdexamfetamine dimesylate) .... Once daily 4)  Abilify 5 Mg Tabs (Aripiprazole) .... Once daily 5)  Cozaar 100 Mg Tabs (Losartan potassium) .Sabrina [name].. 1 once daily 6)  Aleve 220 Mg Tabs (Naproxen sodium) .... Take 2 tablet by mouth two times a day 7)  Hyzaar 100-25 Mg Tabs (Losartan potassium-hctz) .... One tablet by mouth daily  Patient Instructions: 1)  Please follow up in 2 weeks for nurse visit BP check and BMET in 2 weeks. 2)  Please arrange a follow up appointment with Dr. Alwyn Ren in 1 month. Prescriptions: HYZAAR 100-25 MG TABS (LOSARTAN POTASSIUM-HCTZ) one tablet by mouth daily  #30 x 0   Entered and Authorized by:   Lemont Fillers FNP   Signed by:   Lemont Fillers FNP on 08/02/2009   Method used:   [facility]        [facility]  [address] [phone]* (retail)       [address] [place], [place]  [address]       Ph: [phone] or [phone]       Fax: [phone]   RxID:   [id number]   Current Allergies (reviewed today): ! VICODIN

## 2010-06-05 NOTE — Letter (Signed)
Summary: [facility]  [facility]   Imported By: Lester Castorland 06/12/2009 10:22:45  _____________________________________________________________________  External Attachment:    Type:   Image     Comment:   External Document

## 2010-06-05 NOTE — Letter (Signed)
Summary: [facility]  [facility]   Imported By: Lanelle Bal 02/07/2010 11:22:37  _____________________________________________________________________  External Attachment:    Type:   Image     Comment:   External Document

## 2010-06-05 NOTE — Progress Notes (Signed)
Summary: Prilosec-- prior auth needed    Phone Note Refill Request Message from:  Fax from Pharmacy on April 14, 2010 9:42 AM  Refills Requested: Medication #1:  PRILOSEC 20 MG  CPDR one by mouth 30 min pre b'fast as needed [facility] [address] [place], [place], [place]  [phone], fax  = [phone]    qty = 30     needs prior auth--phone=[phone],  member ID# = [id number]  Initial call taken by: Jerolyn Shin,  April 14, 2010 9:43 AM  Follow-up for Phone Call        Forms requested, case #[id number]. Lucious Groves CMA  April 15, 2010 8:57 AM  Follow-up by: Lucious Groves CMA,  April 15, 2010 8:40 AM  Additional Follow-up for Phone Call Additional follow up Details #1::        Forms completed and faxed, will await insurance company reply. Additional Follow-up by: Lucious Groves CMA,  April 15, 2010 2:49 PM     Appended Document: Prilosec-- prior auth needed   Approved until 04/2011.

## 2010-06-05 NOTE — Assessment & Plan Note (Signed)
Summary: PLACE ON LEFT LEG/KDC   Vital Signs:  Patient profile:   50 year old female Weight:      214.8 pounds Temp:     98.4 degrees F oral Pulse rate:   92 / minute Resp:     16 per minute BP sitting:   118 / 70  (left arm) Cuff size:   large  Vitals Entered By: Shonna Chock (August 08, 2009 3:44 PM) CC: 1.) Examine raised area on left inner thigh x 2 months  2.)Urination concerns-? UTI  Comments REVIEWED MED LIST, PATIENT AGREED DOSE AND INSTRUCTION CORRECT    Primary Care Provider:  Dr. Alwyn Ren  CC:  1.) Examine raised area on left inner thigh x 2 months  2.)Urination concerns-? UTI .  History of Present Illness: Onset 2 months ago as tenderness L thigh w/o trigger or injury. It has increased in size with increased pain to touch . Concerned about weight loss , now on Phenterimine.  Allergies: 1)  ! Vicodin  Review of Systems General:  Denies chills, fever, sweats, and weight loss. GU:  Urgency with slight leakage  w/o frank incontinence . Derm:  Complains of changes in color of skin; denies itching.  Physical Exam  General:  in no acute distress; alert,appropriate and cooperative throughout examination Abdomen:  Bowel sounds positive,abdomen soft and non-tender without masses, organomegaly or hernias noted. 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 1 q 12 hrs with a meal  Problem # 2:  URINARY URGENCY (JWJ-191.47)  Complete Medication List: 1)  Prilosec 20 Mg Cpdr (Omeprazole) .... One by mouth two times a day 2)  Effexor Xr 75 Mg  Xr24h-cap (Venlafaxine hcl) .... 3 by mouth once daily 3)  Abilify 5 Mg Tabs (Aripiprazole) .... Once daily 4)  Aleve 220 Mg Tabs (Naproxen sodium) .... Take 2 tablet by mouth two times a day 5)  Hyzaar 100-25 Mg Tabs (Losartan potassium-hctz) .... One tablet by mouth daily 6)  Phentermine Hcl 37.5 Mg Caps (Phentermine hcl) .[name].. 1 by mouth once daily 7)  Amoxicillin-pot Clavulanate 500-125 Mg Tabs (Amoxicillin-pot clavulanate) .[name].. 1 q 12 hrs with a meal  Other Orders: UA Dipstick w/o Micro (manual) (82956)  Patient Instructions: 1)  Warm compresses three times a day to thigh.  Surgical consult if no better.Consume < 40 grams of HFCS sugar/ day as discussed. Discuss urgency with Dr Rosalio Macadamia. Prescriptions: AMOXICILLIN-POT CLAVULANATE 500-125 MG TABS (AMOXICILLIN-POT CLAVULANATE) 1 q 12 hrs with a meal  #20 x 0   Entered and Authorized by:   Marga Melnick MD   Signed by:   Marga Melnick MD on 08/08/2009   Method used:   [facility] ...       [facility]  [address] [phone]* (retail)       [address] [place], [place]  [address]       Ph: [phone] or [phone]       Fax: [phone]   RxID:   [id number]   Laboratory Results   Urine Tests    Routine Urinalysis  Color: straw Appearance: Clear Glucose: negative   (Normal Range: Negative) Bilirubin: negative   (Normal Range: Negative) Ketone: negative   (Normal Range: Negative) Spec. Gravity: 1.020   (Normal Range: 1.003-1.035) Blood: negative   (Normal Range: Negative) pH: 6.0   (Normal Range: 5.0-8.0) Protein: negative   (Normal Range: Negative) Urobilinogen: 0.2   (Normal Range: 0-1) Nitrite: negative   (Normal Range: Negative) Leukocyte Esterace: negative   (Normal Range: Negative)

## 2010-06-05 NOTE — Assessment & Plan Note (Signed)
Summary: review lab/cbs   Vital Signs:  Patient profile:   50 year old female Weight:      175.2 pounds BMI:     30.90 Pulse rate:   88 / minute Resp:     14 per minute BP sitting:   130 / 72  (left arm) Cuff size:   large  Vitals Entered By: Shonna Chock CMA (April 08, 2010 4:06 PM) CC: Follow-up visit: discuss labs (copy geiven) , Lipid Management   Primary Care Demarkis Gheen:  Dr. Alwyn Ren  CC:  Follow-up visit: discuss labs (copy geiven)  and Lipid Management.  History of Present Illness: Hyperlipidemia Follow-Up      This is a 50 year old woman who presents for Hyperlipidemia follow-up.  Other symptoms include  some dypsnea.  The patient denies the following symptoms: chest pain/pressure, exercise intolerance, palpitations, syncope, and pedal edema.  Dietary compliance has been good.  The patient reports exercising 5 X per week as walking.   TG DRAMATICALLY better with Optifast  , 131 now, prev 271. No premature CAD /MI in FH.  Lipid Management History:      Positive NCEP/ATP III risk factors include hypertension.  Negative NCEP/ATP III risk factors include female age less than 87 years old, non-diabetic, no family history for ischemic heart disease, non-tobacco-user status, no ASHD (atherosclerotic heart disease), no prior stroke/TIA, no peripheral vascular disease, and no history of aortic aneurysm.     Current Medications (verified): 1)  Prilosec 20 Mg  Cpdr (Omeprazole) .... One By Mouth 30 Min Pre B'fast As Needed 2)  Effexor Xr 150 Mg Xr24h-Cap (Venlafaxine Hcl) .[name].. 1 By Mouth Once Daily 3)  Hyzaar 100-25 Mg Tabs (Losartan Potassium-Hctz) .... One Tablet By Mouth Daily 4)  Vyvanse 70 Mg Caps (Lisdexamfetamine Dimesylate) .[name].. 1 By Mouth Once Daily 5)  Topiramate 25 Mg Tabs (Topiramate) .[name].. 1 By Mouth Once Daily  Allergies: 1)  ! Vicodin  Past History:  Past Medical History: GERD Hypertension Allergic Rhinitis cervical cancer, PMH of , Dr  Rosalio Macadamia Hyperlipidemia: NMR 03/2009: LDL 161(2735/1887), HDL 37, TG 271. Framingham Study LDL goal = < 160. abnormal TFTs (794.5); Sleep Apnea, stable on CPAP  Family History: emphysema- father and mother heart disease- mother(MI @ 80) and father (MI mid 70s), grandparents cancer- M  grandmother- breast; PGM  stomach;P grandfather? intra abdominal; MI: PGF,MGF & MGM  Physical Exam  General:  well-nourished,alert,appropriate and cooperative throughout examination Lungs:  Normal respiratory effort, chest expands symmetrically. Lungs are clear to auscultation, no crackles or wheezes. Heart:  Normal rate and regular rhythm. S1 and S2 normal without gallop, murmur, click, rub .S4  Pulses:  R and L carotid,radial,dorsalis pedis and posterior tibial pulses are full and equal bilaterally Extremities:  No clubbing, cyanosis, edema.   Impression & Recommendations:  Problem # 1:  DYSLIPIDEMIA (ICD-272.4)  Problem # 2:  CORONARY ARTERY DISEASE, FAMILY HX (ICD-V17.3)  Complete Medication List: 1)  Prilosec 20 Mg Cpdr (Omeprazole) .... One by mouth 30 min pre b'fast as needed 2)  Effexor Xr 150 Mg Xr24h-cap (Venlafaxine hcl) .[name].. 1 by mouth once daily 3)  Hyzaar 100-25 Mg Tabs (Losartan potassium-hctz) .... One tablet by mouth daily 4)  Vyvanse 70 Mg Caps (Lisdexamfetamine dimesylate) .[name].. 1 by mouth once daily 5)  Topiramate 25 Mg Tabs (Topiramate) .[name].. 1 by mouth once daily  Lipid Assessment/Plan:      Based on NCEP/ATP III, the patient's risk factor category is "0-1 risk factors".  The patient's  lipid goals are as follows: Total cholesterol goal is 200; LDL cholesterol goal is 120; HDL cholesterol goal is 40; Triglyceride goal is 150.  Her LDL cholesterol goal has not been met.  Secondary causes for hyperlipidemia have been ruled out.  She has been counseled on adjunctive measures for lowering her cholesterol and has been provided with dietary instructions.    Patient Instructions: 1)   Schedule fasting NMR Lipoprofile ( V17.3, 272.4)   Orders Added: 1)  Est. Patient Level III [38756]

## 2010-06-05 NOTE — Progress Notes (Signed)
Summary: Diet Sheet Brought by Patient  Diet Sheet Brought by Patient   Imported By: Lanelle Bal 01/13/2010 13:03:39  _____________________________________________________________________  External Attachment:    Type:   Image     Comment:   External Document

## 2010-06-05 NOTE — Medication Information (Signed)
Summary: Prior Authorization & Approval for Additional Quantity Omeprazol  Prior Authorization & Approval for Additional Quantity Omeprazole/[employer]   Imported By: Lanelle Bal 04/24/2010 11:35:40  _____________________________________________________________________  External Attachment:    Type:   Image     Comment:   External Document

## 2010-06-05 NOTE — Assessment & Plan Note (Signed)
Summary: follow up//ph   Vital Signs:  Patient profile:   50 year old female Weight:      203.2 pounds Pulse rate:   84 / minute Resp:     16 per minute BP sitting:   124 / 80  (left arm) Cuff size:   large  Vitals Entered By: Shonna Chock CMA (December 31, 2009 9:14 AM) CC: 1.) Follow-up on labs (copy given)  2.) Patient with ongoing boil on inner leg (drains at times), patient notices that it takes a long time for any cut to heal  3.)Discuss Vision   Primary Care Provider:  Dr. Alwyn Ren  CC:  1.) Follow-up on labs (copy given)  2.) Patient with ongoing boil on inner leg (drains at times) and patient notices that it takes a long time for any cut to heal  3.)Discuss Vision.  History of Present Illness: Hyperlipidemia Follow-Up      This is a 50 year old woman who presents for Hyperlipidemia follow-up. She has stopped fish oil supplements. Her diet is very restricted ( see scanned example)  but denies HFCS intake.The patient reports fatigue, but denies muscle aches, GI upset, abdominal pain, flushing, itching, constipation, and diarrhea.  Other symptoms include exercise intolerance and pedal edema.  The patient denies the following symptoms: chest pain/pressure, dypsnea, palpitations, and syncope.  Dietary compliance has been fair.  The patient reports no exercise.                                                                                                      Additionally she has residua of the boil  of L thigh & some visual issues ( see ROS).  Current Medications (verified): 1)  Prilosec 20 Mg  Cpdr (Omeprazole) .... One By Mouth Two Times A Day 2)  Effexor Xr 150 Mg Xr24h-Cap (Venlafaxine Hcl) .[name].. 1 By Mouth Once Daily 3)  Hyzaar 100-25 Mg Tabs (Losartan Potassium-Hctz) .... One Tablet By Mouth Daily 4)  Vyvanse 70 Mg Caps (Lisdexamfetamine Dimesylate) .[name].. 1 By Mouth Once Daily 5)  Topiramate 25 Mg Tabs (Topiramate) .[name].. 1 By Mouth Once Daily  Allergies: 1)  ! Vicodin  Review of  Systems General:  Denies chills, fever, and sweats. Eyes:  Complains of blurring; denies discharge, double vision, and vision loss-both eyes; Ophth exam overdue. GI:  Denies bloody stools, dark tarry stools, and indigestion. GU:  Denies hematuria; Menses heavy; "almost a Vegetarian". Heme:  Denies abnormal bruising and bleeding.  Physical Exam  General:  in no acute distress; alert,appropriate and cooperative throughout examination Neck:  No deformities, masses, or tenderness noted. Lungs:  Normal respiratory effort, chest expands symmetrically. Lungs are clear to auscultation, no crackles or wheezes. Heart:  Normal rate and regular rhythm. S1 and S2 normal without gallop, murmur, click, rub or other extra sounds. Abdomen:  Bowel sounds positive,abdomen soft and non-tender without masses, organomegaly or hernias noted. Pulses:  R and L carotid,radial,dorsalis pedis and posterior tibial pulses are full and equal bilaterally Extremities:  No clubbing, cyanosis, edema. Neurologic:  alert & oriented X3.  Skin:  38 X 5 mm granulomatous changes L medial thigh subcutaneously  Cervical Nodes:  No lymphadenopathy noted Axillary Nodes:  No palpable lymphadenopathy Inguinal Nodes:  No significant adenopathy Psych:  memory intact for recent and remote, normally interactive, and good eye contact.     Impression & Recommendations:  Problem # 1:  DYSLIPIDEMIA (ICD-272.4) Note: 30 min face to face with > 15 min on Nutritional instruction & counselling Her updated medication list for this problem includes:    Trilipix 135 Mg Cpdr (Choline fenofibrate) .[name]... 1 once daily (fasting labs in 10 weeks)  Problem # 2:  VISUAL ACUITY, DECREASED (ICD-369.9)  Orders: Ophthalmology Referral (Ophthalmology)  Problem # 3:  CELLULITIS, LEFT LEG (ICD-682.6) No active infection ; granuloma  subcutaneously   Problem # 4:  ANEMIA, MILD (ICD-285.9)  Orders: Venipuncture (16109) TLB-B12 + Folate Pnl  (60454_09811-B14/NWG) TLB-IBC Pnl (Iron/FE;Transferrin) (83550-IBC) Specimen Handling (95621)  Complete Medication List: 1)  Prilosec 20 Mg Cpdr (Omeprazole) .... One by mouth two times a day 2)  Effexor Xr 150 Mg Xr24h-cap (Venlafaxine hcl) .[name].. 1 by mouth once daily 3)  Hyzaar 100-25 Mg Tabs (Losartan potassium-hctz) .... One tablet by mouth daily 4)  Vyvanse 70 Mg Caps (Lisdexamfetamine dimesylate) .[name].. 1 by mouth once daily 5)  Topiramate 25 Mg Tabs (Topiramate) .[name].. 1 by mouth once daily 6)  Trilipix 135 Mg Cpdr (Choline fenofibrate) .[name].. 1 once daily (fasting labs in 10 weeks)  Patient Instructions: 1)  Consume < 30 grams of High Fructose Corn Syrup sugar/ day as discussed.See OptiFast information. 2)  Please schedule a follow-up appointment in 3 months. 3)  Hepatic Panel prior to visit, ICD-9:995.20 4)  Lipid Panel prior to visit, ICD-9:272.4 5)  CBC w/ Diff prior to visit, ICD-9:285.9. Prescriptions: TRILIPIX 135 MG CPDR (CHOLINE FENOFIBRATE) 1 once daily (fasting labs in 10 weeks)  #30 x 2   Entered and Authorized by:   Marga Melnick MD   Signed by:   Marga Melnick MD on 12/31/2009   Method used:   Print then Give to Patient   RxID:   [id number]

## 2010-06-05 NOTE — Progress Notes (Signed)
Summary: Lab Results  Phone Note Outgoing Call Call back at Home Phone [phone]   Call placed by: Shonna Chock CMA,  December 27, 2009 12:15 PM Call placed to: Patient Summary of Call: Left message on vioice mail informing patient of info below and to call to schedule appointment Triglycerides have increased& are > 2X upper limits of normal.Please read all food & drink labels. Calculate (& bring in data)how grams of sugar/ day you consume from those with High Fructose Corn Syrup as #1, 2 or # 3 on label. Mild anemia present ; we'll duiscuss evaluation @ appt. Levester Fresh CMA  December 27, 2009 12:16 PM

## 2010-06-05 NOTE — Letter (Signed)
Summary: [facility]  [facility]   Imported By: Lester Tukwila 05/13/2009 10:50:22  _____________________________________________________________________  External Attachment:    Type:   Image     Comment:   External Document

## 2010-06-05 NOTE — Progress Notes (Signed)
Summary: lab scheculed  Phone Note Call from Patient Call back at Home Phone [phone]   Caller: Patient Details for Reason: labwork Summary of Call: Rcvd mssg from pt and she says she is due to have labs drawn and unsure of which labs. Pt will also need labs for another facility and didn't want them done twice. Please advise. Initial call taken by: Almeta Monas CMA Duncan Dull),  December 18, 2009 3:58 PM  Follow-up for Phone Call        Patient already came to office for office visit/bmet so I am not sure as of to which labs she is referring. Please advise. Lucious Groves CMA  December 19, 2009 10:51 AM   Additional Follow-up for Phone Call Additional follow up Details #1::        TSH, hepatic panel, lipids , CBC& dif , A1c; 277.7, 995.20, 794.5 Additional Follow-up by: Marga Melnick MD,  December 19, 2009 2:00 PM    Additional Follow-up for Phone Call Additional follow up Details #2::    pt left another message about lab work for opti fast pls give her a call.............Marland KitchenFelecia Deloach CMA  December 20, 2009 10:39 AM

## 2010-06-05 NOTE — Assessment & Plan Note (Signed)
Summary: drainage left thigh seen 08/08/09/alr   Vital Signs:  Patient profile:   50 year old female Weight:      212 pounds Temp:     99.3 degrees F oral BP sitting:   124 / 7  (left arm)  Vitals Entered By: Doristine Devoid (August 12, 2009 2:12 PM) CC: L inner thigh bump now draining    History of Present Illness: 50 yo woman here today for area on L inner thigh that was infected at last visit.  started on Augmentin.  area now draining.  started draining on Saturday.  no fevers.  reports area of surrounding redness is unchanged.  Allergies (verified): 1)  ! Vicodin  Review of Systems      See HPI  Physical Exam  General:  in no acute distress; alert,appropriate and cooperative throughout examination Skin:  2 cm open wound on L inner thigh.  no purulent drainage able to be expressed.  surrounding area of erythema   Impression & Recommendations:  Problem # 1:  CELLULITIS, LEFT LEG (ICD-682.6) Assessment Unchanged area came to a head and ruptured over the weekend.  reassured pt that drainage is definitive tx of abscess/cellulitis.  given appearance will switch to Doxy in order to cover for MRSA.  pt to keep area clean, dry, and covered.  reviewed red flags that should prompt return.  Pt expresses understanding and is in agreement w/ this plan. The following medications were removed from the medication list:    Amoxicillin-pot Clavulanate 500-125 Mg Tabs (Amoxicillin-pot clavulanate) .[name]... 1 q 12 hrs with a meal Her updated medication list for this problem includes:    Doxycycline Hyclate 100 Mg Caps (Doxycycline hyclate) .[name]... Take 1 tab twice a day w/ food.  Complete Medication List: 1)  Prilosec 20 Mg Cpdr (Omeprazole) .... One by mouth two times a day 2)  Effexor Xr 75 Mg Xr24h-cap (Venlafaxine hcl) .... 3 by mouth once daily 3)  Abilify 5 Mg Tabs (Aripiprazole) .... Once daily 4)  Aleve 220 Mg Tabs (Naproxen sodium) .... Take 2 tablet by mouth two times a day 5)  Hyzaar  100-25 Mg Tabs (Losartan potassium-hctz) .... One tablet by mouth daily 6)  Phentermine Hcl 37.5 Mg Caps (Phentermine hcl) .[name].. 1 by mouth once daily 7)  Doxycycline Hyclate 100 Mg Caps (Doxycycline hyclate) .... Take 1 tab twice a day w/ food.  Patient Instructions: 1)  STOP the Augmentin 2)  START the doxycycline- take with food 3)  Keep the area covered 4)  Wash with soap and water 5)  If this recurs in the same place you may need to see a surgeon about removal but at this time there is no need.  It has taken care of itself 6)  If you develop increased pain, drainage, or have other concerns- please call 7)  Hang in there!! Prescriptions: DOXYCYCLINE HYCLATE 100 MG CAPS (DOXYCYCLINE HYCLATE) Take 1 tab twice a day w/ food.  #20 x 0   Entered and Authorized by:   Neena Rhymes MD   Signed by:   Neena Rhymes MD on 08/12/2009   Method used:   Electronically to        [facility] [phone]* (retail)       [address]       [place], [place]  [address]       Ph: [phone] or [phone]       Fax: [phone]   RxID:   [id number]

## 2010-07-02 ENCOUNTER — Other Ambulatory Visit: Payer: Self-pay | Admitting: Obstetrics & Gynecology

## 2010-07-02 DIAGNOSIS — Z1231 Encounter for screening mammogram for malignant neoplasm of breast: Secondary | ICD-10-CM

## 2010-07-08 ENCOUNTER — Ambulatory Visit
Admission: RE | Admit: 2010-07-08 | Discharge: 2010-07-08 | Disposition: A | Discharge status: Home / Self Care | Payer: BC Managed Care – PPO | Source: Ambulatory Visit | Attending: Obstetrics & Gynecology | Admitting: Obstetrics & Gynecology

## 2010-07-08 DIAGNOSIS — Z1231 Encounter for screening mammogram for malignant neoplasm of breast: Secondary | ICD-10-CM

## 2010-07-17 ENCOUNTER — Other Ambulatory Visit: Payer: Self-pay | Admitting: Internal Medicine

## 2010-07-17 ENCOUNTER — Ambulatory Visit (INDEPENDENT_AMBULATORY_CARE_PROVIDER_SITE_OTHER): Payer: BC Managed Care – PPO | Admitting: Internal Medicine

## 2010-07-17 ENCOUNTER — Encounter: Payer: Self-pay | Admitting: Internal Medicine

## 2010-07-17 DIAGNOSIS — R1013 Epigastric pain: Secondary | ICD-10-CM

## 2010-07-17 DIAGNOSIS — R42 Dizziness and giddiness: Secondary | ICD-10-CM

## 2010-07-17 DIAGNOSIS — R110 Nausea: Secondary | ICD-10-CM

## 2010-07-17 DIAGNOSIS — R109 Unspecified abdominal pain: Secondary | ICD-10-CM

## 2010-07-17 LAB — CBC WITH DIFFERENTIAL/PLATELET
Basophils Absolute: 0 10*3/uL (ref 0.0–0.1)
Basophils Relative: 0.4 % (ref 0.0–3.0)
Eosinophils Absolute: 0.1 10*3/uL (ref 0.0–0.7)
HCT: 39.8 % (ref 36.0–46.0)
Hemoglobin: 13.3 g/dL (ref 12.0–15.0)
Lymphocytes Relative: 45.1 % (ref 12.0–46.0)
Lymphs Abs: 2.6 10*3/uL (ref 0.7–4.0)
MCHC: 33.5 g/dL (ref 30.0–36.0)
MCV: 84.3 fl (ref 78.0–100.0)
Monocytes Relative: 9.7 % (ref 3.0–12.0)
Neutro Abs: 2.5 10*3/uL (ref 1.4–7.7)
Neutrophils Relative %: 42.7 % — ABNORMAL LOW (ref 43.0–77.0)
Platelets: 322 10*3/uL (ref 150.0–400.0)
RDW: 14.9 % — ABNORMAL HIGH (ref 11.5–14.6)

## 2010-07-17 LAB — CONVERTED CEMR LAB
Beta hcg, urine, semiquantitative: NEGATIVE
Bilirubin Urine: NEGATIVE
Blood in Urine, dipstick: NEGATIVE
Glucose, Urine, Semiquant: NEGATIVE
Ketones, urine, test strip: NEGATIVE
Nitrite: NEGATIVE
Specific Gravity, Urine: 1.025
pH: 6

## 2010-07-17 LAB — HEPATIC FUNCTION PANEL
ALT: 16 U/L (ref 0–35)
Albumin: 4.2 g/dL (ref 3.5–5.2)
Alkaline Phosphatase: 78 U/L (ref 39–117)
Total Bilirubin: 0.5 mg/dL (ref 0.3–1.2)
Total Protein: 6.8 g/dL (ref 6.0–8.3)

## 2010-07-17 LAB — BASIC METABOLIC PANEL
BUN: 20 mg/dL (ref 6–23)
CO2: 27 mEq/L (ref 19–32)
Calcium: 9.3 mg/dL (ref 8.4–10.5)
Creatinine, Ser: 0.8 mg/dL (ref 0.4–1.2)
GFR: 86.62 mL/min (ref >60.00)
Glucose, Bld: 88 mg/dL (ref 70–99)
Potassium: 4.2 mEq/L (ref 3.5–5.1)
Sodium: 141 mEq/L (ref 135–145)

## 2010-07-18 LAB — AMYLASE: Amylase: 25 U/L — ABNORMAL LOW (ref 27–131)

## 2010-07-18 LAB — LIPASE: Lipase: 41 U/L (ref 11.0–59.0)

## 2010-07-22 NOTE — Assessment & Plan Note (Signed)
Summary: Abdominal pain and unable to eat much/cdj   Vital Signs:  Patient profile:   50 year old female Height:      64 inches Weight:      174.4 pounds BMI:     30.04 Temp:     98.2 degrees F oral Pulse rate:   80 / minute Resp:     14 per minute BP sitting:   112 / 72  (left arm) Cuff size:   large  Vitals Entered By: Shonna Chock CMA (July 17, 2010 9:11 AM) CC: 1.) Abdominal pain and unable to eat  2.) Burping, Syncope   Primary Care Provider:  Dr. Alwyn Ren  CC:  1.) Abdominal pain and unable to eat  2.) Burping and Syncope.  History of Present Illness:    Onset 07/10/2010 as nausea with epigastric dull , aching pain . She now  reports nausea, diarrhea X 1, and anorexia, but denies vomiting, constipation, melena ( but stool is light), hematochezia, and hematemesis.  The pain is described as intermittent and  intermittently radiating to the back.  Associated symptoms include weight loss ( 6# due to poor intake , no Optifast product with present illness).  The patient denies the following symptoms: fever, dysuria, chest pain, jaundice, and dark urine.  The pain is worse with  any food.  The pain is no better with a PPI.      She also  reports near loss of consciousness and lightheadedness with above.  The patient reports the following precipitating factors: none noted.  "It is not positional ; it even occurs moving eyes". Rx: none  Allergies: 1)  ! Vicodin  Review of Systems General:  Complains of chills and sweats. Eyes:  Complains of blurring; denies double vision and vision loss-both eyes. ENT:  Denies decreased hearing, ear discharge, earache, and ringing in ears.  Physical Exam  General:  Appears fatigued,in no acute distress; cooperative throughout examination Eyes:  No corneal or conjunctival inflammation noted. EOMI. Perrla. Field of  Vision grossly normal. No nystagmus. No icterus Ears:  External ear exam shows no significant lesions or deformities.  Otoscopic  examination reveals clear canals, tympanic membranes are intact bilaterally without bulging, retraction, inflammation or discharge. Hearing is grossly normal bilaterally. Nose:  External nasal examination shows no deformity or inflammation. Nasal mucosa are pink and moist without lesions or exudates. Mouth:  Oral mucosa and oropharynx without lesions or exudates.  Tongue moist; no deviation Heart:  Normal rate and regular rhythm. S1 and S2 normal without gallop, murmur, click, rub .S4 Pulses:  R and L carotid pulses are full and equal bilaterally w/o bruits Neurologic:  oriented X3, cranial nerves II-XII intact, strength normal in all extremities, sensation intact to light touch, gait normal, DTRs symmetrical and normal, finger-to-nose normal, and Romberg negative.   Skin:  Intact without suspicious lesions or rashes. No jaundice; no tenting Cervical Nodes:  No lymphadenopathy noted Axillary Nodes:  No palpable lymphadenopathy Psych:  flat affect and subdued.     Impression & Recommendations:  Problem # 1:  ABDOMINAL PAIN, EPIGASTRIC (ICD-789.06)  Orders: Venipuncture (16109) TLB-BMP (Basic Metabolic Panel-BMET) (80048-METABOL) TLB-CBC Platelet - w/Differential (85025-CBCD) TLB-Hepatic/Liver Function Pnl (80076-HEPATIC) Specimen Handling (60454)  Problem # 2:  NAUSEA (ICD-787.02)  Orders: Venipuncture (09811) TLB-BMP (Basic Metabolic Panel-BMET) (80048-METABOL) TLB-CBC Platelet - w/Differential (85025-CBCD) TLB-Hepatic/Liver Function Pnl (80076-HEPATIC) Specimen Handling (91478)  Problem # 3:  DIZZINESS (ICD-780.4)  Orders: Venipuncture (29562) TLB-BMP (Basic Metabolic Panel-BMET) (80048-METABOL) TLB-CBC Platelet - w/Differential (85025-CBCD) TLB-Hepatic/Liver  Function Pnl (80076-HEPATIC) Specimen Handling (16109)  Complete Medication List: 1)  Prilosec 20 Mg Cpdr (Omeprazole) .... One by mouth 30 min pre b'fast as needed 2)  Effexor Xr 150 Mg Xr24h-cap (Venlafaxine hcl)  .[name].. 1 by mouth once daily 3)  Hyzaar 100-25 Mg Tabs (Losartan potassium-hctz) .... One tablet by mouth daily 4)  Vyvanse 70 Mg Caps (Lisdexamfetamine dimesylate) .[name].. 1 by mouth once daily 5)  Topiramate 25 Mg Tabs (Topiramate) .[name].. 1 by mouth once daily 6)  Omega-3 Krill Oil 300 Mg Caps (Krill oil) .[name].. 1 by mouth once daily 7)  Biotin 5000 Mcg Caps (Biotin) .[name].. 1 by mouth once daily 8)  Vitamin C 1000 Mg Tabs (Ascorbic acid) .[name].. 1 by mouth once daily 9)  Clidinium-chlordiazepoxide 2.5-5 Mg Caps (Clidinium-chlordiazepoxide) .[name].. 1 every 6 hrs as needed pain, nausea  Other Orders: UA Dipstick w/o Micro (manual) (60454) T-Culture, Urine (09811-91478)  Patient Instructions: 1)  Complete stool cards. Prilosec OTC two times a day as directed. 2)  Drink clear liquids only for the next 24 hours, then slowly add other liquids and food as you  tolerate them. Prescriptions: CLIDINIUM-CHLORDIAZEPOXIDE 2.5-5 MG CAPS (CLIDINIUM-CHLORDIAZEPOXIDE) 1 every 6 hrs as needed pain, nausea  #30 x 0   Entered and Authorized by:   Marga Melnick MD   Signed by:   Marga Melnick MD on 07/17/2010   Method used:   Print then Give to Patient   RxID:   [id number]    Orders Added: 1)  UA Dipstick w/o Micro (manual) [81002] 2)  Specimen Handling [99000] 3)  T-Culture, Urine [62952-84132] 4)  Est. Patient Level IV [44010] 5)  Venipuncture [36415] 6)  TLB-BMP (Basic Metabolic Panel-BMET) [80048-METABOL] 7)  TLB-CBC Platelet - w/Differential [85025-CBCD] 8)  TLB-Hepatic/Liver Function Pnl [80076-HEPATIC] 9)  Specimen Handling [99000]    Laboratory Results   Urine Tests    Routine Urinalysis   Color: straw Appearance: Cloudy Glucose: negative   (Normal Range: Negative) Bilirubin: negative   (Normal Range: Negative) Ketone: negative   (Normal Range: Negative) Spec. Gravity: 1.025   (Normal Range: 1.003-1.035) Blood: negative   (Normal Range: Negative) pH: 6.0   (Normal Range:  5.0-8.0) Protein: negative   (Normal Range: Negative) Urobilinogen: 0.2   (Normal Range: 0-1) Nitrite: negative   (Normal Range: Negative) Leukocyte Esterace: small   (Normal Range: Negative)    Urine HCG: negative Comments: Sent for culture

## 2010-08-07 LAB — POCT I-STAT 4, (NA,K, GLUC, HGB,HCT)
Glucose, Bld: 97 mg/dL (ref 70–99)
HCT: 39 % (ref 36.0–46.0)
Hemoglobin: 13.3 g/dL (ref 12.0–15.0)
Potassium: 4.4 mEq/L (ref 3.5–5.1)
Sodium: 135 mEq/L (ref 135–145)

## 2010-08-07 LAB — POCT PREGNANCY, URINE: Preg Test, Ur: NEGATIVE

## 2010-08-13 ENCOUNTER — Other Ambulatory Visit: Payer: Self-pay | Admitting: Family

## 2010-08-18 ENCOUNTER — Other Ambulatory Visit: Payer: Self-pay | Admitting: Family

## 2010-09-02 ENCOUNTER — Other Ambulatory Visit: Payer: BC Managed Care – PPO

## 2010-09-02 DIAGNOSIS — Z1211 Encounter for screening for malignant neoplasm of colon: Secondary | ICD-10-CM

## 2010-09-02 LAB — POC HEMOCCULT BLD/STL (OFFICE/1-CARD/DIAGNOSTIC): Fecal Occult Blood, POC: NEGATIVE

## 2010-09-16 NOTE — Procedures (Signed)
NAME:  Sabrina [name], Sabrina [name] NO.:  [id number]   MEDICAL RECORD NO.:  [id number]          PATIENT TYPE:  OUT   LOCATION:  SLEEP CENTER                 FACILITY[facility]   PHYSICIAN:  Oretha Milch, MD      DATE OF BIRTH:  01-24-1961   DATE OF STUDY:                            NOCTURNAL POLYSOMNOGRAM   REFERRING PHYSICIAN:   INDICATION FOR STUDY:  She was referred by a psychiatrist for evaluation  of excessive daytime somnolence, loud snoring, and significant weight  gain since her last negative polysomnogram in October 2003.   EPWORTH SLEEPINESS SCORE:  At the time of the study, she weighed 200  pounds with a BMI of 35, height of 5 feet 3 inches.  Neck size 14  inches.  Epworth sleepiness score 21.   MEDICATIONS:  Dexamphetamine, Benicar, Effexor, and Prilosec.   This overnight polysomnogram was performed with a sleep technologist in  attendance.  EEG, EOG, EMG, EKG, and respiratory parameters were  recorded.  Sleep stages, arousals, limb movements, and respiratory data  were scored according to criteria laid out by the American Academy of  Sleep Medicine.   SLEEP ARCHITECTURE:  Lights out was at 10:13 p.m.  Lights on was at 5:22  a.m.  Total sleep time was 263 minutes with a sleep period time of 391  minutes and a sleep efficiency of 61%.  Sleep latency was 37 minutes.  No REM sleep was noted.  A long period of awakening around 2:00 a.m.  Sleep stages as a percentage of total sleep time was N1 18.6%, N2 77%,  N3 4%, and 0% REM sleep.  She spent 61 minutes in the supine position.   AROUSAL DATA:  There were a total of 159 arousals with an index of 136  events per hour.  Of these, 144 were spontaneous and 13 were associated  with hypopneas.   RESPIRATORY DATA:  There were a total of 0 obstructive apnea, 0 central  apnea, 0 mixed apneas, and 20 hypopneas with an apnea-hypopnea index of  4.6 events per hour, 52 respiratory effort related arousals (RERAs) were  noted  with an RDI of 16 events per hour.  The supine AHI was 6 events  per hour.  Longest hypopnea was 33 seconds.   OXYGEN DATA:  The desaturation index was 4 events per hour.  The lowest  desaturation was 90%.   LIMB MOVEMENT DATA:  No significant limb movements were noted.   CARDIAC DATA:  The lowest heart rate was 34 beats per minute during non-  REM sleep.  The high heart rate recorded was an artifact.   DISCUSSION:  Predominant RERAs, few hypopneas.  Strict criteria was not  maintained.  Absence of REM sleep & frequent awakening.  She was  desensitized with a small full-face mask.   IMPRESSION:  1. No REM sleep was noted and only 11 minutes of slow-wave sleep was      noted.  This may be the effect of medications.  2. Sleep was fragmented due to frequent arousals and long periods of      awakening.  Sleep efficiency was poor.  3. Sleep disordered  breathing was mild with predominant respiratory      effort related arousals and few hypopnea.  This did not meet      criteria for intervention.  4. No evidence of limb movements, cardiac arrhythmias, or behavioral      disturbance during sleep.   RECOMMENDATIONS:  1. The degree of sleep disordered breathing did not qualify for      intervention.  However, predominant RERAs may suggest upper airway      resistance syndrome.  2. I would suggest weight reduction at this time.  If excessive      daytime somnolence persist, a trial of CPAP therapy can be      considered.  3. She should be asked to avoid medications with sedative side      effects.      Oretha Milch, MD  Electronically Signed     RVA/MEDQ  D:  09/25/2008 12:12:23  T:  09/26/2008 03:16:56  Job:  [id number]

## 2010-09-16 NOTE — Op Note (Signed)
NAMECHARMIN, Sabrina [name]             ACCOUNT NO.:  [id number]   MEDICAL RECORD NO.:  [id number]          PATIENT TYPE:  AMB   LOCATION:  SDC                           FACILITY:  [facility]   PHYSICIAN:  Sherry A. Dickstein, M.D.DATE OF BIRTH:  21-May-1960   DATE OF PROCEDURE:  03/23/2008  DATE OF DISCHARGE:                               OPERATIVE REPORT   PREOPERATIVE DIAGNOSIS:  Menorrhagia, thickened endometrium.   POSTOPERATIVE DIAGNOSES:  Menorrhagia, thickened endometrium and  endometrial polyps.   SURGEON:  Sherry A. Rosalio Macadamia, M.D.   ANESTHESIA:  MAC.   PROCEDURE:  D&C hysteroscopy with resectoscope.   INDICATIONS:  This is a 50 year old G2, P 2-0-0-2 woman who has  menstrual periods every 28 days lasting 5-6 days with severe  dysmenorrhea and excessively heavy flow.  Because of this, the patient  has had a workup, which has included an ultrasound and sonohysterogram.  Sonohysterogram indicated thickened posterior endometrium compared to  the anterior endometrium.  Because of this, the patient is brought to  the operating room for [facility] hysteroscopy with resectoscope.   FINDINGS:  Multiple small endometrial polyps throughout the endometrial  cavity.   PROCEDURE IN DETAIL:  The patient is brought into the operating room.  She was given adequate IV sedation.  She was placed in dorsal lithotomy  position.  Her perineum was washed with the Betadine.  Pelvic  examination was performed.  She was draped in sterile fashion.  Speculum  was placed within the vagina.  Vagina was washed with Betadine.  Paracervical block was administered with 1% Nesacaine.  Anterior lip of  the cervix was grasped with a single-tooth tenaculum.  Cervix was  sounded.  Cervix was dilated with Pratt dilators to #23.  The small  hysteroscope was introduced into the endometrial cavity.  Using a single  loop right-angle resector, posterior endometrial resections were  obtained initially with multiple polyps.  This  was continued, however,  because it was seen that there were multiple polyps present, the small  resectoscope was removed.  The cervix was then dilated to #31 [facility]  dilator and the larger hysteroscope was introduced in the endometrial  cavity.  Using a single loop right-angle resector, sheets of endometrial  tissue were removed from posterior endometrial wall and then sheets of  endometrial tissue were removed from the anterior endometrial wall.  This was done circumferentially.  Adequate hemostasis was obtained.  A  few bleeders were then cauterized.  Once adequate hemostasis was present  and all polyps had been removed.  All instruments removed from the  vagina.  Adequate hemostasis was present.  The patient was taken out of  dorsal lithotomy position.  She was awakened.  She was moved from the  operating table to a stretcher in stable condition.   COMPLICATIONS:  None.   ESTIMATED BLOOD LOSS:  Less than 5 mL.   SPECIMENS:  1. Posterior resections.  2. Anterior resections.  These were sent to Pathology.      Sherry A. Rosalio Macadamia, M.D.  Electronically Signed     SAD/MEDQ  D:  03/23/2008  T:  03/24/2008  Job:  [id number]

## 2010-09-19 NOTE — Op Note (Signed)
NAMEBRILEIGH, Sabrina [name]             ACCOUNT NO.:  [id number]   MEDICAL RECORD NO.:  [id number]          PATIENT TYPE:  AMB   LOCATION:  DAY                          FACILITY:  [facility]   PHYSICIAN:  Thomas A. Cornett, M.D.DATE OF BIRTH:  April 03, 1961   DATE OF PROCEDURE:  03/12/2005  DATE OF DISCHARGE:                                 OPERATIVE REPORT   PREOPERATIVE DIAGNOSIS:  Grade 3 prolapsed internal hemorrhoids.   POSTOPERATIVE DIAGNOSIS:  Grade 3 prolapsed internal hemorrhoids.   PROCEDURE:  Procedure for prolapsed hemorrhoids/stapled hemorrhoidectomy.   SURGEON:  Dr. Harriette Bouillon.   ASSISTANT:  Dr. Chevis Pretty.   ANESTHESIA:  General endotracheal anesthesia.   ESTIMATED BLOOD LOSS:  Approximately 10 mL.   SPECIMEN:  Internal hemorrhoids to pathology for evaluation.   DRAINS:  None.   INDICATIONS FOR PROCEDURE:  The patient is a 50 year old female whose had  persistent grade 3 prolapsing internal hemorrhoids for a number of years.  She has tried medical management for these in the past and this has not  helped. After discussing the treatment options, she wished to undergo  stapled hemorrhoidectomy. The procedure was discussed with her at length as  well as alternatives and potential complications of the procedure. She  understood and agreed to proceed.   DESCRIPTION OF PROCEDURE:  The patient was brought to the operating suite  and placed supine. She was intubated in the supine position and then placed  in the prone jack-knife position. Her buttocks were taped apart. The  perineum was then prepped and draped in a sterile fashion. 20 mL of  Wydase  was injected in the perianal tissue. This was massaged for 2 minutes. Next,  a digital examination was done. Tone was normal. An anoscope was then placed  and she had grade 3 prolapsed internal hemorrhoids. Approximately 4-5 cm  deep to the dentate line, a pursestring suture of #1 Prolene was used with  care taken to just take  mucosal bites. This was done circumferentially. Once  this was done, a retractor dilator complex was placed with the strings  coming out the middle of this  and the dilator was removed. The stapling  device was then placed through the pursestring, the pursestring was pulled  up snug and the stapling device popped through the ring without difficulty.  Once it popped through, this was then tied down. Once this was tied down,  the strings  drains were brought out through the instrument and an air knot  was tied. With constant traction on the air knot, the PPH stapling device  was then closed until the staples were completely compressed. This was held  for 1 minute. Prior to firing, I placed a finger in the patient's vagina and  rocked the staple to and fro and did not feel any vaginal wall or any change  on the inside of the vagina to indicate that the stapler had caught in the  vagina. At this point in time, the stapling device was fired. At this point,  the stapling device was then removed after waiting a minute and the ring was  examined which was complete, which showed no evidence of muscle and showed  two areas of hemorrhoids in a complete circular ring at least a centimeter  in size that was removed. At this point in time, the staple ring was  inspected and found to be hemostatic. A 4-0 suture of Vicryl was used and 5  small areas of hemorrhage were noted. This was minimal and these were  oversewn with good result. The remainder of the staple line appeared  hemostatic. I then placed 2% lidocaine jelly and Gelfoam into the area after  withdrawing the anoscope. The patient was then placed supine, extubated and  taken to recovery in satisfactory condition.      Thomas A. Cornett, M.D.  Electronically Signed     TAC/MEDQ  D:  03/12/2005  T:  03/13/2005  Job:  [id number]   cc:   Titus Dubin. Alwyn Ren, M.D. [facility]  [phone]  [place] [address]

## 2010-09-19 NOTE — Op Note (Signed)
[name], Sabrina [name]                         ACCOUNT NO.:  [id number]   MEDICAL RECORD NO.:  [id number]                   PATIENT TYPE:  OIB   LOCATION:  5018                                 FACILITY:  [facility]   PHYSICIAN:  Sharolyn Douglas, M.D.                     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   PROCEDURE:  Left L5-S1 microdiscectomy.   SURGEON:  Sharolyn Douglas, M.D.   ASSISTANT:  Verlin Fester, P.A.   ANESTHESIA:  General endotracheal anesthesia.   COMPLICATIONS:  None.   INDICATIONS FOR PROCEDURE:  The patient is a 50 year old female with  persistent pain and weakness secondary to left L5-S1 HNP and radiculopathy.  She has failed conservative treatment modalities and elected to undergo L5-  S1 microdiscectomy in hopes of improving her symptoms.   PROCEDURE:  The patient was properly identified in the holding area and  taken to the operating room.  She underwent general endotracheal anesthesia  without difficulty.  She was given prophylactic IV antibiotics.  She was  carefully turned prone onto the Wilson frame.  All bony prominences were  padded.  The face and eyes were protected at all times.  The back was  prepped and draped in the usual sterile fashion.  Fluoroscopy was brought  into the operating room field.  The L5-S1 disc space was identified.  A 2 cm  incision was made just off the midline over the L5-S1 interspace on the left  side.  The deep fascia was incised.  Successively large dilators from the  Maxess retractor system were utilized to dilate the paraspinal muscles  docking on the L5-S1 interspace.  We then placed the Maxess retractor with  the 60 mm blades.  The retractor was attached to the  operating room table  using the attachment arm.  We gently dilated the Maxess retractor.  The L5  lamina and interspace were identified.  A small layer of muscle was removed  using pituitary rongeurs.  The microscope was draped and brought into the  field.  A high speed bur was used to remove the inferior 1/3 of the L5  lamina.  The ligamentum flavum was elevated piecemeal.  We identified the  traversing S1 nerve root.  It was being displaced dorsally by an extruded  disc fragment.  By gently mobilizing the S1 nerve root, several fragments of  disc material were removed from the spinal canal medial to the S1 pedicle.  The disc space was entered and several additional loose pieces of disc  material were removed from within the interspace.  The disc space was  irrigated with Angiocath irrigation.  The  spinal canal was explored with a  blunt probe.  2 mL of Fentanyl was left in the epidural space for  postoperative anesthesia.  Maxess retractor was removed.  The deep fascia  was closed with an 0 Vicryl suture.  The subcutaneous layer was closed  with a 2-0 Vicryl followed by Dermabond to reapproximate the skin edges.  The patient was turned supine, extubated without difficulty, and transferred  to the recovery room in stable condition able to move her upper and lower  extremities.                                               Sharolyn Douglas, M.D.    MC/MEDQ  D:  09/06/2003  T:  09/06/2003  Job:  [id number]

## 2010-09-19 NOTE — Op Note (Signed)
Sabrina [name], Sabrina [name]             ACCOUNT NO.:  [id number]   MEDICAL RECORD NO.:  [id number]          PATIENT TYPE:  OIB   LOCATION:  1603                         FACILITY:  [facility]   PHYSICIAN:  Thomas A. Cornett, M.D.DATE OF BIRTH:  05/02/1961   DATE OF PROCEDURE:  07/10/2005  DATE OF DISCHARGE:                                 OPERATIVE REPORT   PREOPERATIVE DIAGNOSIS:  Posterior midline anal fissure.   POSTOPERATIVE DIAGNOSIS:  Posterior midline anal fissure.   PROCEDURE:  Lateral internal sphincterotomy.   SURGEON:  Dr. Harriette Bouillon.   ANESTHESIA:  General endotracheal anesthesia with 30 mL of 0.25% Sensorcaine  local.   ESTIMATED BLOOD LOSS:  20 mL.   DRAINS:  None.   INDICATIONS FOR PROCEDURE:  The patient is a 50 year old female whose had a  history of grade 3 internal hemorrhoid status post a PPH in October 2006.  She developed bleeding and significant pain and was found to have an anal  fissure in her posterior midline. She was managed for 2-3 weeks medically  but failed. The fissure became deep and had exposed internal sphincter  muscle. At this point, I recommend a lateral internal sphincterotomy for  further care. I explained the procedure as well as the potential  complications, long-term outcomes, risks and treatment failures. She voiced  her understanding and agreed to proceed.   DESCRIPTION OF PROCEDURE:  The patient was brought to the operating room and  placed supine initially. After induction of general endotracheal anesthesia,  she was placed in a prone jackknife position with appropriate padding and  positioning. Her buttocks were taped apart. The perianal region was prepped  and draped in a sterile fashion. A digital examination was done. She still  had significant grade 3 internal hemorrhoids despite PPH. She had a very  deep posterior midline fissure. Tone was normal. An anoscope was placed and  the posterior midline fissure was examined. It was  down to the internal  sphincter muscle. I decided to choose her right lateral position for her  sphincterotomy. We rotated the speculum to the right lateral position on the  patient and I made a small skin incision after palpating for her sphincter.  The anterior sphincteric groover unfortunately was very indistinct. Incision  was made beginning at the dentate line down to the skin of the anoderm. I  used a hemostat to spread the tissue. She did have some hemorrhoidal tissue  that bled and this was controlled with cautery. I was able to expose the  internal sphincter and use a hemostat, dissect it out and find the  intersphincteric groove that way. I then divided the internal sphincter  using cautery up to the dentate line. Hemostasis was excellent. I  reapproximated the mucosa over this loosely with 3-0 Monocryl and left the  bottom open to drain. I also covered the exposed sphincter of the fissure  very loosely with some redundant hemorrhoidal tissue. Gelfoam was used for  hemostasis and this was placed as a rectal  packing. Infiltration of the anoderm skin as well as the area of  sphincterotomy was done with 0.25%  Sensorcaine local. A large ABD pad was  applied. She was then placed supine, extubated, and taken to recovery in  satisfactory condition.      Thomas A. Cornett, M.D.  Electronically Signed     TAC/MEDQ  D:  07/10/2005  T:  07/10/2005  Job:  [id number]   cc:   Titus Dubin. Alwyn Ren, M.D. [facility]  [phone] [place]  [place] [address]

## 2010-10-14 ENCOUNTER — Other Ambulatory Visit: Payer: Self-pay | Admitting: Internal Medicine

## 2010-10-14 MED ORDER — OMEPRAZOLE 20 MG PO CPDR: 20.0000 mg | DELAYED_RELEASE_CAPSULE | Freq: Every day | ORAL | Status: DC

## 2010-10-14 NOTE — Telephone Encounter (Signed)
rx sent to pharmacy

## 2010-12-05 ENCOUNTER — Encounter: Payer: Self-pay | Admitting: Internal Medicine

## 2011-02-03 LAB — CBC
MCV: 81.4
RBC: 4.57
WBC: 8.8

## 2011-02-03 LAB — BASIC METABOLIC PANEL
CO2: 27
Chloride: 105
GFR calc Af Amer: >60
Potassium: 4.1
Sodium: 137

## 2011-03-30 ENCOUNTER — Ambulatory Visit (INDEPENDENT_AMBULATORY_CARE_PROVIDER_SITE_OTHER): Payer: BC Managed Care – PPO | Admitting: Family

## 2011-03-30 DIAGNOSIS — J329 Chronic sinusitis, unspecified: Secondary | ICD-10-CM

## 2011-03-30 MED ORDER — AMOXICILLIN-POT CLAVULANATE 875-125 MG PO TABS: 1.0000 | ORAL_TABLET | Freq: Two times a day (BID) | ORAL | Status: AC

## 2011-03-30 MED ORDER — BENZONATATE 100 MG PO CAPS: 100.0000 mg | ORAL_CAPSULE | Freq: Three times a day (TID) | ORAL | Status: DC | PRN

## 2011-03-30 NOTE — Patient Instructions (Signed)

## 2011-03-30 NOTE — Progress Notes (Signed)
Subjective:    Patient ID: Sabrina [name], female    DOB: November 18, 1960, 50 y.o.   MRN: [id number]  HPI    Review of Systems     Objective:   Physical Exam  Constitutional: She appears well-developed and well-nourished. No distress.  HENT:       + frontal and maxillary sinus tenderness to palpation. Most tender overlying left frontal sinus.  R TM normal.  L TM with yellow effusion, no erythema or bulging.   Eyes: Conjunctivae are normal.  Neck: No thyromegaly present.  Cardiovascular: Normal rate and regular rhythm.   No murmur heard. Pulmonary/Chest: Effort normal and breath sounds normal. No respiratory distress. She has no wheezes. She has no rales. She exhibits no tenderness.  Musculoskeletal: She exhibits no edema.  Lymphadenopathy:    She has cervical adenopathy.  Skin: Skin is warm and dry. No rash noted.          Assessment & Plan:   Subjective:     Sabrina [name] is a 50 y.o. female who presents for evaluation of symptoms of a cough. Symptoms include achiness and cough described as nonproductive. Onset of symptoms was 4 days ago, and has been gradually worsening since that time. Treatment to date: mucinex, robitussin without improvement..She denies history of asthma or smoking. She has been visiting her mother [name] recently.  Denies fever.  Appetite is fair. Denies nausea, vomitting.    + sinus congestion.    Past Medical History  Diagnosis Date  . GERD (gastroesophageal reflux disease)   . Hypertension   . Allergy     rhinitis  . Cancer     cervical..[facility]  . Hyperlipidemia     NMR 03/2009.Marland KitchenLDL 161(2735/1887)HDL 37,TG 271  . Borderline abnormal TFTs     794.5;  . Sleep apnea     stable on cpap    History   Social History  . Marital Status: Legally Separated    Spouse Name: N/A    Number of Children: N/A  . Years of Education: N/A   Occupational History  . 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 Smoking status:  Never Smoker   . Smokeless tobacco: Not on file  . Alcohol Use: No  . Drug Use:   . Sexually Active:    Other Topics Concern  . Not on file   Social History Narrative   LIVES WITH 1 SON2 DOGSNO DIET, [place]    Past Surgical History  Procedure Date  . Sinus surgery for septal deviation 1999    & polyps  . Wrist surgery   . Knee arthroscopy     x2,open procedure for hamstring tendon injury)  . Lumbar laminectomy     for ruptured disc;G2 P2    Family History  Problem Relation Age of Onset  . Emphysema Mother   . Heart disease Mother 61    MI  . Emphysema Father   . Heart disease Father 57    MI  . Cancer Maternal Grandmother     BREAST  . Heart disease Maternal Grandmother     MI  . Heart disease Maternal Grandfather     MI  . Cancer Paternal Grandmother     STOMACH  . Cancer Paternal Grandfather     ? INTRA ABDOMINAL  . Heart disease Paternal Grandfather     MI    Allergies  Allergen Reactions  . Hydrocodone-Acetaminophen  Current Outpatient Prescriptions on File Prior to Visit  Medication Sig Dispense Refill  . Ascorbic Acid (VITAMIN C) 1000 MG tablet Take 1,000 mg by mouth daily.        [name] lisdexamfetamine (VYVANSE) 70 MG capsule Take 60 mg by mouth every morning.       [name] losartan-hydrochlorothiazide (HYZAAR) 100-25 MG per tablet TAKE 1 TABLET BY MOUTH EVERY DAY  30 tablet  2  . omeprazole (PRILOSEC) 20 MG capsule Take 1 capsule (20 mg total) by mouth daily.  90 capsule  2  . topiramate (TOPIRAGEN) 25 MG tablet Take 25 mg by mouth daily.        [name] venlafaxine (EFFEXOR-XR) 150 MG 24 hr capsule Take 150 mg by mouth daily.        . clidinium-chlordiazePOXIDE (RE CHLORDIAZEPOXIDE/CLIDINIUM) 2.5-5 MG per capsule Take 1 capsule by mouth 3 (three) times daily as needed.          BP 130/86  Pulse 116  SpO2 99%    Review of Systems Pertinent items are noted in HPI.   Objective:       Assessment:    Plan:

## 2011-03-30 NOTE — Assessment & Plan Note (Signed)
Pt with sinusitis, early L OM and cough- likely exacerbated by sinus drainage.  Will plan to treat with a 10 day course of augmentin and tessalon PRN cough. She is instructed to contact us if her symptoms worsen, or if no improvement in 2-3 days.

## 2011-04-07 ENCOUNTER — Encounter: Payer: Self-pay | Admitting: Family Medicine

## 2011-04-07 ENCOUNTER — Ambulatory Visit (INDEPENDENT_AMBULATORY_CARE_PROVIDER_SITE_OTHER): Payer: BC Managed Care – PPO | Admitting: Family Medicine

## 2011-04-07 ENCOUNTER — Ambulatory Visit (HOSPITAL_BASED_OUTPATIENT_CLINIC_OR_DEPARTMENT_OTHER)
Admission: RE | Admit: 2011-04-07 | Discharge: 2011-04-07 | Disposition: A | Discharge status: Home / Self Care | Payer: BC Managed Care – PPO | Source: Ambulatory Visit | Attending: Family Medicine | Admitting: Family Medicine

## 2011-04-07 VITALS — BP 125/82 | HR 90 | Temp 98.8°F | Ht 63.5 in | Wt 211.2 lb

## 2011-04-07 DIAGNOSIS — R059 Cough, unspecified: Secondary | ICD-10-CM | POA: Insufficient documentation

## 2011-04-07 DIAGNOSIS — R05 Cough: Secondary | ICD-10-CM | POA: Insufficient documentation

## 2011-04-07 MED ORDER — BUDESONIDE-FORMOTEROL FUMARATE 80-4.5 MCG/ACT IN AERO: 2.0000 | INHALATION_SPRAY | Freq: Two times a day (BID) | RESPIRATORY_TRACT | Status: DC

## 2011-04-07 MED ORDER — GUAIFENESIN-CODEINE 100-10 MG/5ML PO SYRP: 10.0000 mL | ORAL_SOLUTION | Freq: Four times a day (QID) | ORAL | Status: DC | PRN

## 2011-04-07 NOTE — Patient Instructions (Signed)
[facility] to [facility] and get your [facility]- we'll call you with the results Use the cough syrup as needed Drink plenty of fluids Use the inhaler, 2 puffs twice daily to open your airway and help w/ cough REST! Hang in there!!!

## 2011-04-07 NOTE — Progress Notes (Signed)
  Subjective:    Patient ID: Sabrina [name], female    DOB: June 14, 1960, 50 y.o.   MRN: [id number]  HPI Cough- sxs started on 11/22.  Was seen by [facility] and started on Augmentin for sinus infxn and OM.  Cough is unchanged- nonproductive, severe.  Sinuses have improved- no longer having facial pain.  No hx of asthma.  No fevers but having sweats.  No chills.  Having some neck and back soreness from cough.   Review of Systems For ROS see HPI     Objective:   Physical Exam  Constitutional: She appears well-developed and well-nourished. No distress.  HENT:  Head: Normocephalic and atraumatic.       TMs normal bilaterally Mild nasal congestion Throat w/out erythema, edema, or exudate  Eyes: Conjunctivae and EOM are normal. Pupils are equal, round, and reactive to light.  Neck: Normal range of motion. Neck supple.  Cardiovascular: Normal rate, regular rhythm, normal heart sounds and intact distal pulses.   No murmur heard. Pulmonary/Chest: Effort normal and breath sounds normal. No respiratory distress. She has no wheezes.       + hacking cough  Lymphadenopathy:    She has no cervical adenopathy.          Assessment & Plan:

## 2011-04-07 NOTE — Assessment & Plan Note (Signed)
Pt still on abx.  Cough is severe.  No fever to suggest flu.  Get CXR.  Start Symbicort to improve airway inflammation.  Cough meds prn.  Pt expressed understanding and is in agreement w/ plan.

## 2011-04-16 ENCOUNTER — Encounter: Payer: Self-pay | Admitting: Family Medicine

## 2011-04-16 ENCOUNTER — Ambulatory Visit (INDEPENDENT_AMBULATORY_CARE_PROVIDER_SITE_OTHER): Payer: BC Managed Care – PPO | Admitting: Family Medicine

## 2011-04-16 VITALS — BP 140/85 | HR 110 | Temp 98.8°F | Ht 63.5 in | Wt 211.8 lb

## 2011-04-16 DIAGNOSIS — J329 Chronic sinusitis, unspecified: Secondary | ICD-10-CM

## 2011-04-16 MED ORDER — ALBUTEROL SULFATE HFA 108 (90 BASE) MCG/ACT IN AERS: 2.0000 | INHALATION_SPRAY | RESPIRATORY_TRACT | Status: DC | PRN

## 2011-04-16 MED ORDER — ALBUTEROL SULFATE (5 MG/ML) 0.5% IN NEBU
2.5000 mg | INHALATION_SOLUTION | Freq: Once | RESPIRATORY_TRACT | Status: AC
  Administered 2011-04-16: 2.5 mg via RESPIRATORY_TRACT

## 2011-04-16 MED ORDER — MOXIFLOXACIN HCL 400 MG PO TABS: 400.0000 mg | ORAL_TABLET | Freq: Every day | ORAL | Status: DC

## 2011-04-16 NOTE — Patient Instructions (Signed)
This is definitely a sinus infection Take the Avelox daily x10 days Continue the cough meds and the Symbicort inhaler Use the Albuterol inhaler as needed for coughing fits Call with any questions or concerns Hang in there!!!

## 2011-04-16 NOTE — Progress Notes (Signed)
  Subjective:    Patient ID: Sabrina [name], female    DOB: Jul 12, 1960, 50 y.o.   MRN: [id number]  HPI URI- sxs started at Thanksgiving.  Completed course of Augmentin.  Had normal CXR on 12/4.  But continues w/ cough and extreme fatigue.  Cough is worse in AM.  Sweating w/ activity.  No fevers, no chills, no body aches.  + HA, sinus pressure.   Review of Systems For ROS see HPI     Objective:   Physical Exam  Vitals reviewed. Constitutional: She appears well-developed and well-nourished. No distress.  HENT:  Head: Normocephalic and atraumatic.  Right Ear: Tympanic membrane normal.  Left Ear: Tympanic membrane normal.  Nose: Mucosal edema and rhinorrhea present. Right sinus exhibits maxillary sinus tenderness and frontal sinus tenderness. Left sinus exhibits maxillary sinus tenderness and frontal sinus tenderness.  Mouth/Throat: Uvula is midline and mucous membranes are normal. Posterior oropharyngeal erythema present. No oropharyngeal exudate.  Eyes: Conjunctivae and EOM are normal. Pupils are equal, round, and reactive to light.  Neck: Normal range of motion. Neck supple.  Cardiovascular: Normal rate, regular rhythm and normal heart sounds.   Pulmonary/Chest: Effort normal and breath sounds normal. No respiratory distress. She has no wheezes.       + dry cough  Lymphadenopathy:    She has no cervical adenopathy.          Assessment & Plan:

## 2011-04-19 NOTE — Assessment & Plan Note (Signed)
Pt's hx and PE consistent w/ infxn.  Given recent use of augmentin will start avelox.  Reviewed supportive care and red flags that should prompt return.  Pt expressed understanding and is in agreement w/ plan.

## 2011-04-27 ENCOUNTER — Other Ambulatory Visit: Payer: Self-pay | Admitting: Internal Medicine

## 2011-04-27 NOTE — Telephone Encounter (Signed)
Patient needs to schedule a CPX  

## 2011-05-01 ENCOUNTER — Ambulatory Visit (INDEPENDENT_AMBULATORY_CARE_PROVIDER_SITE_OTHER): Payer: BC Managed Care – PPO | Admitting: Family Medicine

## 2011-05-01 ENCOUNTER — Ambulatory Visit: Payer: BC Managed Care – PPO | Admitting: Family Medicine

## 2011-05-01 ENCOUNTER — Ambulatory Visit (INDEPENDENT_AMBULATORY_CARE_PROVIDER_SITE_OTHER)
Admission: RE | Admit: 2011-05-01 | Discharge: 2011-05-01 | Disposition: A | Discharge status: Home / Self Care | Payer: BC Managed Care – PPO | Source: Ambulatory Visit | Attending: Family Medicine | Admitting: Family Medicine

## 2011-05-01 ENCOUNTER — Encounter: Payer: Self-pay | Admitting: Family Medicine

## 2011-05-01 ENCOUNTER — Ambulatory Visit (HOSPITAL_BASED_OUTPATIENT_CLINIC_OR_DEPARTMENT_OTHER)
Admission: RE | Admit: 2011-05-01 | Discharge: 2011-05-01 | Disposition: A | Discharge status: Home / Self Care | Payer: BC Managed Care – PPO | Source: Ambulatory Visit | Attending: Family Medicine | Admitting: Family Medicine

## 2011-05-01 DIAGNOSIS — R059 Cough, unspecified: Secondary | ICD-10-CM

## 2011-05-01 DIAGNOSIS — J329 Chronic sinusitis, unspecified: Secondary | ICD-10-CM

## 2011-05-01 DIAGNOSIS — R05 Cough: Secondary | ICD-10-CM

## 2011-05-01 DIAGNOSIS — R51 Headache: Secondary | ICD-10-CM

## 2011-05-01 MED ORDER — MOXIFLOXACIN HCL 400 MG PO TABS: 400.0000 mg | ORAL_TABLET | Freq: Every day | ORAL | Status: AC

## 2011-05-01 MED ORDER — GUAIFENESIN-CODEINE 100-10 MG/5ML PO SYRP: 10.0000 mL | ORAL_SOLUTION | Freq: Four times a day (QID) | ORAL | Status: DC | PRN

## 2011-05-01 NOTE — Patient Instructions (Signed)
Restart the Avelox daily Use the cough meds as needed Mucinex to thin your congestion Continue your inhalers We'll notify you of your imaging results Call with any questions or concerns Hang in there!!!

## 2011-05-01 NOTE — Progress Notes (Signed)
  Subjective:    Patient ID: Sabrina [name], female    DOB: 12/08/1960, 50 y.o.   MRN: [id number]  HPI Pt had recent sinus infxn- completed 10 day course of avelox.  Within 1 day of stopping abx again had cough, body aches.  No known temps.  + chills and sweats.  + sinus pressure- 'it's killing me'.  + ear pressure.  No known sick contacts.  Recently had normal CXR.  Reports cough is now productive.   Review of Systems For ROS see HPI     Objective:   Physical Exam  Constitutional: She appears well-developed and well-nourished. No distress.  HENT:  Head: Normocephalic and atraumatic.  Right Ear: Tympanic membrane normal.  Left Ear: Tympanic membrane normal.  Nose: Mucosal edema and rhinorrhea present. Right sinus exhibits maxillary sinus tenderness and frontal sinus tenderness. Left sinus exhibits maxillary sinus tenderness and frontal sinus tenderness.  Mouth/Throat: Uvula is midline and mucous membranes are normal. Posterior oropharyngeal erythema present. No oropharyngeal exudate.  Eyes: Conjunctivae and EOM are normal. Pupils are equal, round, and reactive to light.  Neck: Normal range of motion. Neck supple.  Cardiovascular: Normal rate, regular rhythm and normal heart sounds.   Pulmonary/Chest: Effort normal and breath sounds normal. No respiratory distress. She has no wheezes.       + hacking cough  Lymphadenopathy:    She has no cervical adenopathy.          Assessment & Plan:

## 2011-05-05 DIAGNOSIS — R439 Unspecified disturbances of smell and taste: Secondary | ICD-10-CM

## 2011-05-05 HISTORY — DX: Unspecified disturbances of smell and taste: R43.9

## 2011-05-08 ENCOUNTER — Telehealth: Payer: Self-pay | Admitting: *Deleted

## 2011-05-08 NOTE — Telephone Encounter (Signed)
Spoke to pt to advise instructions noted in previous note from MD Alwyn Ren, also advise pt about Saturday clinic and gave hours/number to [facility] in case she wants to be seen, pt will finish out ABT and decide on Monday if she wants to go to work or not, if not she wanted to make sure she could get a note for work based on her coming to see [place] all this time, I advised she can call back in to get a work note sent via fax of for pick up, pt understood and stated she is feeling better but coughs when she moves around, pt did advise she still has the cough medication and this does help her.

## 2011-05-08 NOTE — Telephone Encounter (Signed)
Pt called to advise that her s/s are not improving, she is on her third round on ABT and has 3 tablets left she wanted to ask if she should go back to work on Monday or not, she is afraid she will get sicker since she has not finished her ABT completely, please advise?

## 2011-05-08 NOTE — Telephone Encounter (Signed)
Avelox is the strongest antibiotic we have; please complete the entire course. The CAT scan revealed no sinusitis. Chest x-ray did not reveal pneumonia. If symptoms persist, unfortunately followup will be necessary as this would probably be more asthmatic bronchitis than  Infection after 3 courses of antibiotics

## 2011-05-10 NOTE — Assessment & Plan Note (Signed)
Persistent w/out improvement despite course of Avelox.  Repeat course of Avelox.  Discussed possibility of ENT referral if no improvement after 2nd round of abx.  Reviewed supportive care and red flags that should prompt return.  Pt expressed understanding and is in agreement w/ plan.

## 2011-05-10 NOTE — Assessment & Plan Note (Signed)
Deteriorated.  Repeat CXR.  Restart Avelox.  Encouraged symbicort use regularly.  If no improvement in cough or CXR unrevealing will need pulmonary consult.  Reviewed supportive care and red flags that should prompt return.  Pt expressed understanding and is in agreement w/ plan.

## 2011-05-13 ENCOUNTER — Ambulatory Visit (INDEPENDENT_AMBULATORY_CARE_PROVIDER_SITE_OTHER): Payer: BC Managed Care – PPO | Admitting: Family Medicine

## 2011-05-13 ENCOUNTER — Encounter: Payer: Self-pay | Admitting: Family Medicine

## 2011-05-13 VITALS — BP 120/75 | HR 109 | Temp 98.9°F | Ht 64.0 in | Wt 210.4 lb

## 2011-05-13 DIAGNOSIS — R05 Cough: Secondary | ICD-10-CM

## 2011-05-13 MED ORDER — PREDNISONE 20 MG PO TABS: ORAL_TABLET | ORAL | Status: DC

## 2011-05-13 NOTE — Assessment & Plan Note (Signed)
Ongoing.  Completed 3 courses of abx.  Likely airway inflammation at this point.  Start prednisone.  Continue inhalers.  In no improvement will refer to pulm.  Pt expressed understanding and is in agreement w/ plan.

## 2011-05-13 NOTE — Progress Notes (Signed)
  Subjective:    Patient ID: Sabrina [name], female    DOB: 1960-11-07, 51 y.o.   MRN: [id number]  HPI Cough- sinus congestion has improved.  Completed 2 courses of Avelox.  Cough persists.  Still using Symbicort bid.  Albuterol as needed.     Review of Systems For ROS see HPI     Objective:   Physical Exam  Vitals reviewed. Constitutional: She appears well-developed and well-nourished. No distress.  HENT:  Head: Normocephalic and atraumatic.       TMs normal bilaterally Mild nasal congestion Throat w/out erythema, edema, or exudate  Eyes: Conjunctivae and EOM are normal. Pupils are equal, round, and reactive to light.  Neck: Normal range of motion. Neck supple.  Cardiovascular: Normal rate, regular rhythm, normal heart sounds and intact distal pulses.   No murmur heard. Pulmonary/Chest: Effort normal and breath sounds normal. No respiratory distress. She has no wheezes.       + dry cough  Lymphadenopathy:    She has no cervical adenopathy.          Assessment & Plan:

## 2011-05-13 NOTE — Patient Instructions (Signed)
Start the Prednisone as directed- take w/ food REST! Drink plenty of fluids Hang in there!!

## 2011-05-18 ENCOUNTER — Ambulatory Visit: Payer: BC Managed Care – PPO | Admitting: Internal Medicine

## 2011-06-15 ENCOUNTER — Ambulatory Visit (INDEPENDENT_AMBULATORY_CARE_PROVIDER_SITE_OTHER): Payer: BC Managed Care – PPO | Admitting: Family Medicine

## 2011-06-15 ENCOUNTER — Encounter: Payer: Self-pay | Admitting: Family Medicine

## 2011-06-15 VITALS — BP 154/115 | HR 128 | Temp 98.8°F | Wt 211.0 lb

## 2011-06-15 DIAGNOSIS — R51 Headache: Secondary | ICD-10-CM

## 2011-06-15 DIAGNOSIS — J019 Acute sinusitis, unspecified: Secondary | ICD-10-CM

## 2011-06-15 DIAGNOSIS — I10 Essential (primary) hypertension: Secondary | ICD-10-CM

## 2011-06-15 LAB — BASIC METABOLIC PANEL
BUN: 20 mg/dL (ref 6–23)
Chloride: 104 mEq/L (ref 96–112)
Glucose, Bld: 85 mg/dL (ref 70–99)
Potassium: 4.1 mEq/L (ref 3.5–5.3)

## 2011-06-15 MED ORDER — AMOXICILLIN-POT CLAVULANATE 875-125 MG PO TABS: 1.0000 | ORAL_TABLET | Freq: Two times a day (BID) | ORAL | Status: AC

## 2011-06-15 MED ORDER — FLUTICASONE PROPIONATE 50 MCG/ACT NA SUSP: 2.0000 | Freq: Every day | NASAL | Status: DC

## 2011-06-15 MED ORDER — OXYCODONE-ACETAMINOPHEN 5-325 MG PO TABS: 1.0000 | ORAL_TABLET | ORAL | Status: AC | PRN

## 2011-06-15 NOTE — Progress Notes (Signed)
OFFICE NOTE  06/15/2011  CC:  Chief Complaint  Patient presents with  . Sinusitis    HA, face hurts, congestion x 2 weeks     HPI: Patient is a 51 y.o. Caucasian female pt of Dr. Namon Cirri who is here for "sinus" sx's. Pt presents complaining of respiratory symptoms for 14+ days.  Mostly nasal congestion/runny nose, sneezing, and slight PND cough.  Worst symptoms seems to be the frontal/orbital HA/tenderness, thick mucous from nose.  Lately the symptoms seem to be worsening.  No fevers, no wheezing, and no SOB.  No pain in face or teeth.  No significant HA.  ST mild at most.  Symptoms made worse by movement, temp changes.  Symptoms improved by nothing (tried mucinex "sinus" yesterday--?reason for slight bp elevation over her normal?.  Also tried benadryl.  No nasal sprays. Has had 3 respiratory illnesses from Nov 2012 to Jan 2013. Smoker? 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 no Flu shot this season at least 2 wks ago? yes  ROS: no n/v/d or abdominal pain.  No rash.  No neck stiffness.   +Mild fatigue.  +Mild appetite loss.   Pertinent PMH:  Past Medical History  Diagnosis Date  . GERD (gastroesophageal reflux disease)   . Hypertension   . Allergy     rhinitis  . Cancer     cervical..pmh  . Hyperlipidemia     NMR 03/2009.Marland KitchenLDL 161(2735/1887)HDL 37,TG 271  . Borderline abnormal TFTs     794.5;  . Sleep apnea     stable on cpap   Past Surgical History  Procedure Date  . Sinus surgery for septal deviation 1999    & polyps  . Wrist surgery   . Knee arthroscopy     x2,open procedure for hamstring tendon injury)  . Lumbar laminectomy     for ruptured disc;G2 P2    MEDS:  Outpatient Prescriptions Prior to Visit  Medication Sig Dispense Refill  . albuterol (VENTOLIN HFA) 108 (90 BASE) MCG/ACT inhaler Inhale 2 puffs into the lungs every 4 (four) hours as needed for wheezing or shortness of breath.  1 Inhaler  1  . Ascorbic Acid  (VITAMIN C) 1000 MG tablet Take 1,000 mg by mouth daily.        [name] lisdexamfetamine (VYVANSE) 70 MG capsule Take 60 mg by mouth every morning.       [name] losartan-hydrochlorothiazide (HYZAAR) 100-25 MG per tablet TAKE 1 TABLET BY MOUTH EVERY DAY  30 tablet  1  . omeprazole (PRILOSEC) 20 MG capsule Take 1 capsule (20 mg total) by mouth daily.  90 capsule  2  . topiramate (TOPIRAGEN) 25 MG tablet Take 25 mg by mouth daily.        [name] venlafaxine (EFFEXOR-XR) 150 MG 24 hr capsule Take 150 mg by mouth daily.        . budesonide-formoterol (SYMBICORT) 80-4.5 MCG/ACT inhaler Inhale 2 puffs into the lungs 2 (two) times daily.  1 Inhaler  12  . guaiFENesin-codeine (ROBITUSSIN AC) 100-10 MG/5ML syrup Take 10 mLs by mouth 4 (four) times daily as needed for cough.  240 mL  0  . JOLESSA 0.15-0.03 MG tablet       . benzonatate (TESSALON) 100 MG capsule Take 100 mg by mouth 3 (three) times daily as needed.        . predniSONE (DELTASONE) 20 MG tablet 2 tabs daily x10 days  20 tablet  0  NOTE: not taking jolessa or  prednisone currently  PE: Blood pressure 154/115, pulse 128, temperature 98.8 F (37.1 C), temperature source Temporal, weight 211 lb (95.709 kg), last menstrual period 06/12/2011, SpO2 97.00%. VS: noted--bp/hr up.  Repeat bp in exam room with large cuff 150/98 in right arm. Gen: alert, NAD, NONTOXIC APPEARING. HEENT: eyes without injection, drainage, or swelling.  Ears: EACs clear, TMs with normal light reflex and landmarks.  Nose: irritated/injected nasal mucosa, no purulent d/c noted.  No facial swelling.  Throat and mouth without focal lesion.  TTP in maxillary and frontal sinus areas bilat. No pharyngial swelling, erythema, or exudate.   Neck: supple, no LAD.   LUNGS: CTA bilat, nonlabored resps.   CV: RRR, no m/r/g. EXT: no c/c/e SKIN: no rash  IMPRESSION AND PLAN: Acute sinusitis. BP up today, partially due to recent phenylephrine and partially due to pain response. Augmentin 875mg  bid x  10d, flonase 1-2 sprays each nostril qd, and percocet 5/325, 1-2 q6h prn pain, #30, no RF. Out of work today and tomorrow.   Return tomorrow for nurse visit, either here or at Dr. Frederik Pear office, for bp/hr recheck. No change in bp med made today. BMET checked today.  FOLLOW UP: prn

## 2011-06-16 ENCOUNTER — Ambulatory Visit (INDEPENDENT_AMBULATORY_CARE_PROVIDER_SITE_OTHER): Payer: BC Managed Care – PPO | Admitting: Family Medicine

## 2011-06-16 VITALS — BP 137/90 | HR 104

## 2011-06-16 DIAGNOSIS — I10 Essential (primary) hypertension: Secondary | ICD-10-CM

## 2011-06-16 NOTE — Progress Notes (Signed)
Pts BP was 137/90, Ox 96, and Pulse 104. Pt had also stated she has been up all night and her aunt passed away last night.

## 2011-06-17 NOTE — Progress Notes (Signed)
Patient ID: Sabrina [name], female   DOB: 05-04-1961, 50 y.o.   MRN: [id number] Patient in to see nurse for bp check

## 2011-06-22 ENCOUNTER — Encounter: Payer: Self-pay | Admitting: Family Medicine

## 2011-06-22 ENCOUNTER — Ambulatory Visit (INDEPENDENT_AMBULATORY_CARE_PROVIDER_SITE_OTHER): Payer: BC Managed Care – PPO | Admitting: Family Medicine

## 2011-06-22 VITALS — BP 198/94 | HR 128 | Temp 99.0°F | Wt 207.0 lb

## 2011-06-22 DIAGNOSIS — I10 Essential (primary) hypertension: Secondary | ICD-10-CM

## 2011-06-22 DIAGNOSIS — J329 Chronic sinusitis, unspecified: Secondary | ICD-10-CM

## 2011-06-22 NOTE — Progress Notes (Signed)
OFFICE VISIT  06/22/2011   CC:  Chief Complaint  Patient presents with  . Hypertension    elevated BP     HPI:    Patient is a 51 y.o. Caucasian female who presents for 7d f/u uncontrolled HTN, sinusitis. Her nasal mucous is clearing up but frontal/orbital/occipital HA on/off continue.  Still fatigued, appetite down. Very little coughing, if any.  No fevers.  Mild nausea--?attributes to augmentin lately. No vision or hearing complaints.  No focal weakness.  No diarrhea, no blood in stool/melena.   Past Medical History  Diagnosis Date  . GERD (gastroesophageal reflux disease)   . Hypertension   . Allergy     rhinitis  . Cancer     cervical..[facility]  . Hyperlipidemia     NMR 03/2009.Marland KitchenLDL 161(2735/1887)HDL 37,TG 271  . Borderline abnormal TFTs     794.5;  . Sleep apnea     stable on cpap    Past Surgical History  Procedure Date  . Sinus surgery for septal deviation 1999    & polyps  . Wrist surgery   . Knee arthroscopy     x2,open procedure for hamstring tendon injury)  . Lumbar laminectomy     for ruptured disc;G2 P2    Outpatient Prescriptions Prior to Visit  Medication Sig Dispense Refill  . albuterol (VENTOLIN HFA) 108 (90 BASE) MCG/ACT inhaler Inhale 2 puffs into the lungs every 4 (four) hours as needed for wheezing or shortness of breath.  1 Inhaler  1  . amoxicillin-clavulanate (AUGMENTIN) 875-125 MG per tablet Take 1 tablet by mouth 2 (two) times daily.  20 tablet  0  . Ascorbic Acid (VITAMIN C) 1000 MG tablet Take 1,000 mg by mouth daily.        . fluticasone (FLONASE) 50 MCG/ACT nasal spray Place 2 sprays into the nose daily.  16 g  1  . lisdexamfetamine (VYVANSE) 70 MG capsule Take 60 mg by mouth every morning.       [name] losartan-hydrochlorothiazide (HYZAAR) 100-25 MG per tablet TAKE 1 TABLET BY MOUTH EVERY DAY  30 tablet  1  . omeprazole (PRILOSEC) 20 MG capsule Take 1 capsule (20 mg total) by mouth daily.  90 capsule  2  . oxyCODONE-acetaminophen (PERCOCET)  5-325 MG per tablet Take 1 tablet by mouth every 4 (four) hours as needed for pain.  30 tablet  0  . topiramate (TOPIRAGEN) 25 MG tablet Take 25 mg by mouth daily.        [name] venlafaxine (EFFEXOR-XR) 150 MG 24 hr capsule Take 150 mg by mouth daily.        . budesonide-formoterol (SYMBICORT) 80-4.5 MCG/ACT inhaler Inhale 2 puffs into the lungs 2 (two) times daily.  1 Inhaler  12  . guaiFENesin-codeine (ROBITUSSIN AC) 100-10 MG/5ML syrup Take 10 mLs by mouth 4 (four) times daily as needed for cough.  240 mL  0  . JOLESSA 0.15-0.03 MG tablet         Allergies  Allergen Reactions  . Hydrocodone-Acetaminophen     Pt states she has itching after taking large quantities.  OK prn.    ROS As per HPI  PE: Blood pressure 198/94, pulse 128, temperature 99 F (37.2 C), temperature source Temporal, weight 207 lb (93.895 kg), last menstrual period 06/12/2011. Gen: Alert, tired and mildly anxious appearing.  Patient is oriented to person, place, time, and situation. ENT: Ears: EACs clear, normal epithelium.  TMs with good light reflex and landmarks bilaterally.  Eyes: no injection,  icteris, swelling, or exudate.  EOMI, PERRLA. Nose: no drainage or turbinate edema/swelling.  No injection or focal lesion.  Mouth: lips without lesion/swelling.  Oral mucosa pink and moist.  Dentition intact and without obvious caries or gingival swelling.  Oropharynx without erythema, exudate, or swelling.  Neck - No masses or thyromegaly or limitation in range of motion CV: RRR, no m/r/g.   LUNGS: CTA bilat, nonlabored resps, good aeration in all lung fields. ABD: soft, NT, ND, BS normal.  No hepatospenomegaly or mass.  No bruits. EXT: no clubbing, cyanosis, or edema.    LABS:    Chemistry      Component Value Date/Time   NA 136 06/15/2011 1515   K 4.1 06/15/2011 1515   CL 104 06/15/2011 1515   CO2 19 06/15/2011 1515   BUN 20 06/15/2011 1515   CREATININE 0.80 06/15/2011 1515   CREATININE 0.8 07/17/2010 0955        Component Value Date/Time   CALCIUM 9.9 06/15/2011 1515   ALKPHOS 78 07/17/2010 0955   AST 20 07/17/2010 0955   ALT 16 07/17/2010 0955   BILITOT 0.5 07/17/2010 0955       IMPRESSION AND PLAN:  HYPERTENSION, ESSENTIAL NOS Uncontrolled--this likely explains all of her physical symptoms lately. Continue cozaar and add bystolic 10mg  qd.  Increase to 20mg  qd in 4d if bp not at goal of 140/90 and HR is above 65. F/u in office in 1 wk.  Sinusitis: resolving.  Normal CT sinuses 04/2011 makes me consider atypical migraines as cause of her HA's rather than actual sinusitis.  Nevertheless, finish current course of augmentin.   FOLLOW UP: Return in about 7 days (around 06/29/2011) for f/u HTN.

## 2011-06-22 NOTE — Assessment & Plan Note (Signed)
Uncontrolled--this likely explains all of her physical symptoms lately. Continue cozaar and add bystolic 10mg  qd.  Increase to 20mg  qd in 4d if bp not at goal of 140/90 and HR is above 65. F/u in office in 1 wk.

## 2011-07-02 ENCOUNTER — Telehealth: Payer: Self-pay | Admitting: *Deleted

## 2011-07-02 NOTE — Telephone Encounter (Signed)
Pt was feeling dizzy at work no other symptoms.  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  Pt has taken hyzaar and bystolic this AM.  Pt has sporadically taken BP and it goes up and down.  Please advise any other recommendations.

## 2011-07-02 NOTE — Telephone Encounter (Signed)
Has she increased her bystolic to 20mg  daily yet (like we talked about at last visit 06/22/11 if bp continued to stay high)? Need to know this first, then I'll have further rec's.

## 2011-07-02 NOTE — Telephone Encounter (Signed)
RC to pt.  Left message.  Advised I will try to call her again tonight before I leave as phones have been turned over to after-hours.

## 2011-07-03 NOTE — Telephone Encounter (Signed)
Pt had not gone up to 20 mg.  She had visit with OB/Gyn today and was told to increase to 20 mg and to d/c OCP.  She will do this.  Pt has follow up appt with you on Tuesday.  Any further recommendations until that time/

## 2011-07-03 NOTE — Telephone Encounter (Signed)
This is good.  Have her monitor bp AND heart rate once each morning and once each evening and write them down and bring to appt for review.  --thx

## 2011-07-07 ENCOUNTER — Ambulatory Visit (INDEPENDENT_AMBULATORY_CARE_PROVIDER_SITE_OTHER): Payer: BC Managed Care – PPO | Admitting: Family Medicine

## 2011-07-07 ENCOUNTER — Encounter: Payer: Self-pay | Admitting: Family Medicine

## 2011-07-07 DIAGNOSIS — I10 Essential (primary) hypertension: Secondary | ICD-10-CM

## 2011-07-07 DIAGNOSIS — F419 Anxiety disorder, unspecified: Secondary | ICD-10-CM

## 2011-07-07 DIAGNOSIS — F411 Generalized anxiety disorder: Secondary | ICD-10-CM

## 2011-07-07 MED ORDER — LISDEXAMFETAMINE DIMESYLATE 30 MG PO CAPS: ORAL_CAPSULE | ORAL | Status: DC

## 2011-07-07 MED ORDER — AMLODIPINE BESYLATE 10 MG PO TABS: 10.0000 mg | ORAL_TABLET | Freq: Every day | ORAL | Status: DC

## 2011-07-07 MED ORDER — CLONAZEPAM 1 MG PO TABS: ORAL_TABLET | ORAL | Status: DC

## 2011-07-07 NOTE — Progress Notes (Signed)
OFFICE NOTE  07/11/2011  CC:  Chief Complaint  Patient presents with  . Hypertension    follow up     HPI: Patient is a 51 y.o. Caucasian female who is here for 3 wk for uncontrolled HTN. Has just now increased the bystolic we added last visit from 10 to 20mg  qd, also still on hyzaar 100/25 qd. Has been having some intermittent feeling of vague dizziness on and off lately, along with brief spurts of generalized fatigue/malaise.  BP up to 150s over 90s at work lately.  Rare stage 2 level.  She immediately brings up her concern about stress/anxiety being a part of this: describes being a person who internalizes her worries/concerns, minimizes things in herself, uses humor a lot to deflect her own or others' concern.   Her mom had an MI 2 yrs ago, her dad died during this time period, last year she took care of an ailing aunt.  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  "I've had no break".  Stress worse in afternoon.   Nonsmoker, no drug use, rare ETOH. Her psychiatrist is in the proess of weening her off venlafaxine and up-titrating viibryd (she starts 40mg  qd dosing in a day or two).  Of note, her bp issues had begun prior to this med change.  Pertinent PMH:  Past Medical History  Diagnosis Date  . GERD (gastroesophageal reflux disease)   . Hypertension   . Allergy     rhinitis  . Cancer     cervical..pmh  . Hyperlipidemia     NMR 03/2009.Marland KitchenLDL 161(2735/1887)HDL 37,TG 271  . Borderline abnormal TFTs     794.5;  . Sleep apnea     stable on cpap   Past surgical, social, and family history reviewed and no changes noted since last office visit.  MEDS:  Outpatient Prescriptions Prior to Visit  Medication Sig Dispense Refill  . losartan-hydrochlorothiazide (HYZAAR) 100-25 MG per tablet TAKE 1 TABLET BY MOUTH EVERY DAY  30 tablet  1  . omeprazole (PRILOSEC) 20 MG capsule Take 1 capsule (20 mg total) by mouth daily.  90 capsule  2  . topiramate (TOPIRAGEN) 25 MG tablet Take 25  mg by mouth daily.        [name] venlafaxine (EFFEXOR-XR) 150 MG 24 hr capsule Take 150 mg by mouth daily.        [name] lisdexamfetamine (VYVANSE) 70 MG capsule Take 60 mg by mouth every morning.       . nebivolol (BYSTOLIC) 10 MG tablet Take 20 mg by mouth daily.       [name] albuterol (VENTOLIN HFA) 108 (90 BASE) MCG/ACT inhaler Inhale 2 puffs into the lungs every 4 (four) hours as needed for wheezing or shortness of breath.  1 Inhaler  1  . Ascorbic Acid (VITAMIN C) 1000 MG tablet Take 1,000 mg by mouth daily.        . budesonide-formoterol (SYMBICORT) 80-4.5 MCG/ACT inhaler Inhale 2 puffs into the lungs 2 (two) times daily.  1 Inhaler  12  . fluticasone (FLONASE) 50 MCG/ACT nasal spray Place 2 sprays into the nose daily.  16 g  1  . guaiFENesin-codeine (ROBITUSSIN AC) 100-10 MG/5ML syrup Take 10 mLs by mouth 4 (four) times daily as needed for cough.  240 mL  0  . JOLESSA 0.15-0.03 MG tablet       **Her GYN took her off of her Marcille Blanco due to recent bp probs.  PE: Blood pressure 146/101, pulse 98, temperature 98.7  F (37.1 C), temperature source Temporal, height 5\' 4"  (1.626 m), weight 213 lb (96.616 kg), last menstrual period 06/12/2011.  6 lb increase in last 3 wks. Gen: Alert, well appearing.  Patient is oriented to person, place, time, and situation. Affect: mildly anxious, at times frustrated and occasionally tearful but no outright crying. CV: RRR, no m/r/g.   LUNGS: CTA bilat, nonlabored resps, good aeration in all lung fields. EXT: no clubbing, cyanosis, or edema.   IMPRESSION AND PLAN:  HYPERTENSION, ESSENTIAL NOS Slightly improving but still not well controlled. No sign of secondary HTN. Agree with recent d/c of OCP by pt's GYN.  Also, since pt doesn't think vyvanse has helped much she is ok with getting off of this since it can be associated with HTN worsening: ween off (take 30mg  qd x 7d, then 15mg  qd x 6d). No change in current bp meds at this time: bystolic 20mg  qd and hyzaar 100/25  qd.  Anxiety Continue switch-over from venlafaxine to viibryd, continue topamax, ween off vyvanse as previously stated.  Also, start clonazepam 1mg  bid prn. Reassured pt, encouraged pt   I did write a note to pt's employer advocating for 2 wks off work as therapy to help her current conditions.  FOLLOW UP: 2 wks

## 2011-07-10 ENCOUNTER — Telehealth: Payer: Self-pay | Admitting: Internal Medicine

## 2011-07-10 MED ORDER — NEBIVOLOL HCL 20 MG PO TABS: 1.0000 | ORAL_TABLET | Freq: Every day | ORAL | Status: DC

## 2011-07-10 NOTE — Telephone Encounter (Signed)
RX sent and pharmacy changed to [facility] in [place]

## 2011-07-11 DIAGNOSIS — F419 Anxiety disorder, unspecified: Secondary | ICD-10-CM | POA: Insufficient documentation

## 2011-07-11 NOTE — Assessment & Plan Note (Addendum)
Continue switch-over from venlafaxine to viibryd, continue topamax, ween off vyvanse as previously stated.  Also, start clonazepam 1mg  bid prn. Reassured pt, encouraged pt

## 2011-07-11 NOTE — Assessment & Plan Note (Addendum)
Slightly improving but still not well controlled. No sign of secondary HTN. Agree with recent d/c of OCP by pt's GYN.  Also, since pt doesn't think vyvanse has helped much she is ok with getting off of this since it can be associated with HTN worsening: ween off (take 30mg  qd x 7d, then 15mg  qd x 6d). No change in current bp meds at this time: bystolic 20mg  qd and hyzaar 100/25 qd.

## 2011-07-13 ENCOUNTER — Other Ambulatory Visit: Payer: Self-pay | Admitting: Internal Medicine

## 2011-07-13 NOTE — Telephone Encounter (Signed)
Patient went to pick up Rx for Bystolic, it was $16 for a month's supply. Is there anything else she can take? She is on her way to our office to drop some forms, you can let her know when you get here.

## 2011-07-14 NOTE — Telephone Encounter (Signed)
Yes, this plan is fine with me.-thx

## 2011-07-14 NOTE — Telephone Encounter (Signed)
Prescriptions sent to pharmacy

## 2011-07-14 NOTE — Telephone Encounter (Signed)
I spoke to pt on 07/13/11 regarding medication.  I gave pt samples of Bystolic and free 30 day Rx card and co-pay card to use.  With copay card pt will still have to pay about $48.  I encouraged pt to use samples and free 30 day trial now so we can work on getting her BP down and if after these meds are out and copay is still too much using card to call us.  Pt OK with this plan, but I think she needs to hear if MD would prefer this plan.  Please advise. Pt also dropped off FMLA forms for you to fill out.

## 2011-07-14 NOTE — Telephone Encounter (Signed)
Notified pt to take samples, 30 day free card.  She is agreeable.  BP this afternoon was 116/77 p 85.

## 2011-07-20 ENCOUNTER — Telehealth: Payer: Self-pay

## 2011-07-20 NOTE — Telephone Encounter (Signed)
PA for Omeprazole 20 mg --approved for 6 months only according to the PA department.

## 2011-07-21 ENCOUNTER — Encounter: Payer: Self-pay | Admitting: Family Medicine

## 2011-07-21 ENCOUNTER — Ambulatory Visit (INDEPENDENT_AMBULATORY_CARE_PROVIDER_SITE_OTHER): Payer: BC Managed Care – PPO | Admitting: Family Medicine

## 2011-07-21 VITALS — BP 101/66 | HR 84 | Ht 64.0 in | Wt 215.0 lb

## 2011-07-21 DIAGNOSIS — G894 Chronic pain syndrome: Secondary | ICD-10-CM

## 2011-07-21 DIAGNOSIS — I10 Essential (primary) hypertension: Secondary | ICD-10-CM

## 2011-07-21 DIAGNOSIS — H547 Unspecified visual loss: Secondary | ICD-10-CM

## 2011-07-21 DIAGNOSIS — R5381 Other malaise: Secondary | ICD-10-CM

## 2011-07-21 DIAGNOSIS — H5710 Ocular pain, unspecified eye: Secondary | ICD-10-CM

## 2011-07-21 DIAGNOSIS — R5382 Chronic fatigue, unspecified: Secondary | ICD-10-CM

## 2011-07-21 LAB — VITAMIN B12: Vitamin B-12: 822 pg/mL (ref 211–911)

## 2011-07-21 LAB — COMPREHENSIVE METABOLIC PANEL
BUN: 14 mg/dL (ref 6–23)
CO2: 22 mEq/L (ref 19–32)
Calcium: 9.1 mg/dL (ref 8.4–10.5)
Chloride: 103 mEq/L (ref 96–112)
Creat: 0.87 mg/dL (ref 0.50–1.10)

## 2011-07-21 LAB — CK: Total CK: 72 U/L (ref 7–177)

## 2011-07-21 MED ORDER — CYCLOBENZAPRINE HCL 5 MG PO TABS: ORAL_TABLET | ORAL | Status: DC

## 2011-07-21 NOTE — Progress Notes (Signed)
OFFICE NOTE  07/21/2011  CC:  Chief Complaint  Patient presents with  . Follow-up    HTN     HPI: Patient is a 51 y.o. Caucasian female who is here for 2 wk f/u HTN and anxiety.  Pt's home bp monitoring shows normal measurements except for a couple in the stage 1 HTN range.  She feels better about this, but still reiterates the HORRIBLE PHYSICAL symptoms she has been suffering with the last year or more: basically extreme fatigue that affects day to day life, causes her to withdraw, chronic pain "everywhere" --but admits the worst areas are her low back, neck, wrists.  Denies hx of swelling in joints or discoloration of joints.  Admits to depression, but wonders if it is a symptom or is it the cause of the way she feels. Recently was switched over from venlafaxine to viibryd by her psychiatrist --feels no signif diff (on 40mg  qd tab now). She has weened off vyvanse and she took the clonazepam I rx'd last visit only occasionally--doesn't want to get dependent on med, etc.    Pertinent PMH:  Past Medical History  Diagnosis Date  . GERD (gastroesophageal reflux disease)   . Hypertension   . Allergy     rhinitis  . Cancer     cervical..pmh  . Hyperlipidemia     NMR 03/2009.Marland KitchenLDL 161(2735/1887)HDL 37,TG 271  . Borderline abnormal TFTs     794.5;  . Sleep apnea     stable on cpap    MEDS:  Outpatient Prescriptions Prior to Visit  Medication Sig Dispense Refill  . albuterol (VENTOLIN HFA) 108 (90 BASE) MCG/ACT inhaler Inhale 2 puffs into the lungs every 4 (four) hours as needed for wheezing or shortness of breath.  1 Inhaler  1  . amLODipine (NORVASC) 10 MG tablet Take 1 tablet (10 mg total) by mouth daily.  30 tablet  6  . clonazePAM (KLONOPIN) 1 MG tablet 1/2-1 tab bid prn anxiety  60 tablet  0  . losartan-hydrochlorothiazide (HYZAAR) 100-25 MG per tablet TAKE 1 TABLET BY MOUTH EVERY DAY  30 tablet  3  . Nebivolol HCl (BYSTOLIC) 20 MG TABS Take 1 tablet (20 mg total) by mouth daily.   30 tablet  1  . omeprazole (PRILOSEC) 20 MG capsule TAKE ONE CAPSULE BY MOUTH DAILY  90 capsule  3  . topiramate (TOPIRAGEN) 25 MG tablet Take 25 mg by mouth daily.        . Vilazodone HCl (VIIBRYD) 10 & 20 & 40 MG KIT Take 1 tablet by mouth daily.      . Ascorbic Acid (VITAMIN C) 1000 MG tablet Take 1,000 mg by mouth daily.        . budesonide-formoterol (SYMBICORT) 80-4.5 MCG/ACT inhaler Inhale 2 puffs into the lungs 2 (two) times daily.  1 Inhaler  12  . fluticasone (FLONASE) 50 MCG/ACT nasal spray Place 2 sprays into the nose daily.  16 g  1  . QUASENSE 0.15-0.03 MG tablet       . guaiFENesin-codeine (ROBITUSSIN AC) 100-10 MG/5ML syrup Take 10 mLs by mouth 4 (four) times daily as needed for cough.  240 mL  0  . lisdexamfetamine (VYVANSE) 30 MG capsule 1 cap po qAM x 7d, then open capsule and take 1/2 of contents qd x 6d  10 capsule  0  . venlafaxine (EFFEXOR-XR) 150 MG 24 hr capsule Take 150 mg by mouth daily.        Of note: not taking  Debarah Crape, vyvanse, or effexor listed above.  Also states she is not taking the symbicort since 05/2011  PE: Blood pressure 101/66, pulse 84, height 5\' 4"  (1.626 m), weight 215 lb (97.523 kg). Gen: Alert, well appearing.  Patient is oriented to person, place, time, and situation. Affect: worried/anxious/frustrated.  Displays lucid thought and speech. CV: RRR, no m/r/g.   LUNGS: CTA bilat, nonlabored resps, good aeration in all lung fields. EXT: no clubbing, cyanosis, or edema.   LABS: none today. Recent:    Chemistry      Component Value Date/Time   NA 136 06/15/2011 1515   K 4.1 06/15/2011 1515   CL 104 06/15/2011 1515   CO2 19 06/15/2011 1515   BUN 20 06/15/2011 1515   CREATININE 0.80 06/15/2011 1515   CREATININE 0.8 07/17/2010 0955      Component Value Date/Time   CALCIUM 9.9 06/15/2011 1515   ALKPHOS 78 07/17/2010 0955   AST 20 07/17/2010 0955   ALT 16 07/17/2010 0955   BILITOT 0.5 07/17/2010 0955       IMPRESSION AND PLAN:  HYPERTENSION,  ESSENTIAL NOS Now under control. Continue current meds, DASH diet.  Chronic fatigue With chronic musculoskeletal pain.  Long DDX, including rheumatologic dz, autoimmune dz, endocrine dz (hypothyroidism), OSA, anemia, depression.  Pt voices concern that it has always been blamed on depression, but she asks if possibly the depression could simply be another symptom of a physical problem that is causing all of this.  I told her I'll start by doing a general lab w/u today, then have her back for more history taking and exam in 10d.  In the meantime, will review past w/u in records. The only new med I rx'd today was flexeril 5mg , 1-2 tid prn.  Therapeutic expectations and side effect profile of medication discussed today.  Patient's questions answered.    Spent 25 min with pt today, with over 50% of this time spent counseling pt regarding her chronic sx's and coming up with a plan of action.   FOLLOW UP: 10d

## 2011-07-21 NOTE — Assessment & Plan Note (Signed)
Now under control. Continue current meds, DASH diet.

## 2011-07-21 NOTE — Assessment & Plan Note (Addendum)
With chronic musculoskeletal pain.  Long DDX, including rheumatologic dz, autoimmune dz, endocrine dz (hypothyroidism), OSA, anemia, depression.  Pt voices concern that it has always been blamed on depression, but she asks if possibly the depression could simply be another symptom of a physical problem that is causing all of this.  I told her I'll start by doing a general lab w/u today, then have her back for more history taking and exam in 10d.  In the meantime, will review past w/u in records. The only new med I rx'd today was flexeril 5mg , 1-2 tid prn.  Therapeutic expectations and side effect profile of medication discussed today.  Patient's questions answered.

## 2011-07-22 ENCOUNTER — Encounter: Payer: Self-pay | Admitting: Family Medicine

## 2011-07-22 LAB — CBC WITH DIFFERENTIAL/PLATELET
Eosinophils Absolute: 0.1 10*3/uL (ref 0.0–0.7)
Eosinophils Relative: 2 % (ref 0–5)
Hemoglobin: 13 g/dL (ref 12.0–15.0)
Lymphs Abs: 1.6 10*3/uL (ref 0.7–4.0)
MCH: 26.3 pg (ref 26.0–34.0)
MCV: 81 fL (ref 78.0–100.0)
Monocytes Relative: 15 % — ABNORMAL HIGH (ref 3–12)
Platelets: 288 10*3/uL (ref 150–400)
RBC: 4.95 MIL/uL (ref 3.87–5.11)

## 2011-07-29 ENCOUNTER — Encounter: Payer: Self-pay | Admitting: *Deleted

## 2011-07-30 ENCOUNTER — Ambulatory Visit: Payer: BC Managed Care – PPO | Admitting: Family Medicine

## 2011-08-03 ENCOUNTER — Encounter: Payer: Self-pay | Admitting: Family Medicine

## 2011-08-03 ENCOUNTER — Ambulatory Visit (INDEPENDENT_AMBULATORY_CARE_PROVIDER_SITE_OTHER): Payer: BC Managed Care – PPO | Admitting: Family Medicine

## 2011-08-03 VITALS — BP 127/78 | HR 93 | Ht 64.0 in | Wt 222.0 lb

## 2011-08-03 DIAGNOSIS — IMO0001 Reserved for inherently not codable concepts without codable children: Secondary | ICD-10-CM

## 2011-08-03 DIAGNOSIS — I10 Essential (primary) hypertension: Secondary | ICD-10-CM

## 2011-08-03 DIAGNOSIS — M797 Fibromyalgia: Secondary | ICD-10-CM

## 2011-08-03 MED ORDER — PREGABALIN 150 MG PO CAPS: 150.0000 mg | ORAL_CAPSULE | Freq: Two times a day (BID) | ORAL | Status: DC

## 2011-08-03 MED ORDER — OXYCODONE HCL 5 MG PO TABS: ORAL_TABLET | ORAL | Status: DC

## 2011-08-03 NOTE — Patient Instructions (Signed)
Take one 50mg  cap three times a day for 7d, then increase to TWO of the 50mg  caps three times a day for 7d, then increase to THREE caps three times per day (may take with OR without food).

## 2011-08-03 NOTE — Progress Notes (Signed)
OFFICE VISIT  08/05/2011   CC:  Chief Complaint  Patient presents with  . Hypertension    follow up  . Pain    follow up      HPI:    Patient is a 51 y.o. Caucasian female who presents for f/u chronic fatigue and musculoskeletal complaints as well as HTN.   HTN well controlled lately. C/o swelling more in hands and legs lately--she's unsure of sodium intake. Flexeril 5mg  no help for LBP/diffuse musculoskeletal aches/tightness, 10mg  dose=very dizzy.  Lower back pain worse lately, central and without radiation or paresthesias, no LE weakness.   This matches her usual recurrences of episodic LBP she's had since surgery in 2005. Still c/o diffuse, chronic pain in neck and trapezius areas diffusely, describes a feeling of "tightness" all over  She's very concerned about swelling lately, "all over" but mainly lower legs and hands--says they felt tight.  Better today.  Past Medical History  Diagnosis Date  . GERD (gastroesophageal reflux disease)   . Hypertension   . Allergy     rhinitis  . History of cervical cancer     Dr. Rosalio Macadamia  . Hyperlipidemia     NMR 03/2009.Marland KitchenLDL 161(2735/1887)HDL 37,TG 271  . Borderline abnormal TFTs     794.5;  . Sleep apnea     Dr. Vassie Loll  . Irregular menses   . Chronic fatigue     +excessive daytime somnolence  . Chronic pain disorder     Past Surgical History  Procedure Date  . Sinus surgery for septal deviation 1999    & polyps  . Wrist surgery   . Knee arthroscopy     x2,open procedure for hamstring tendon injury)  . Microdiscectomy lumbar 09/2003    L5/S1 left microdiscectomy (Dr. Noel Gerold)  . Plantar fascia release 02/2009    Bilateral (Dr. Simonne Come)  . Combined hysteroscopy diagnostic / d&c 03/2008    Done for thickened posterior endometrium found on w/u for menorrhagia.  Pathology-benign.  . Anal sphincterotomy 2007    for deep anal fissure (Dr. Luisa Hart)  . Hemorrhoid surgery 2006    Prolapsed internal hemorrhoids (Dr. Luisa Hart)      Outpatient Prescriptions Prior to Visit  Medication Sig Dispense Refill  . albuterol (VENTOLIN HFA) 108 (90 BASE) MCG/ACT inhaler Inhale 2 puffs into the lungs every 4 (four) hours as needed for wheezing or shortness of breath.  1 Inhaler  1  . amLODipine (NORVASC) 10 MG tablet Take 1 tablet (10 mg total) by mouth daily.  30 tablet  6  . Ascorbic Acid (VITAMIN C) 1000 MG tablet Take 1,000 mg by mouth daily.        . fluticasone (FLONASE) 50 MCG/ACT nasal spray Place 2 sprays into the nose daily.  16 g  1  . losartan-hydrochlorothiazide (HYZAAR) 100-25 MG per tablet TAKE 1 TABLET BY MOUTH EVERY DAY  30 tablet  3  . Nebivolol HCl (BYSTOLIC) 20 MG TABS Take 1 tablet (20 mg total) by mouth daily.  30 tablet  1  . omeprazole (PRILOSEC) 20 MG capsule TAKE ONE CAPSULE BY MOUTH DAILY  90 capsule  3  . topiramate (TOPIRAGEN) 25 MG tablet Take 25 mg by mouth daily.        . Vilazodone HCl (VIIBRYD) 10 & 20 & 40 MG KIT Take 1 tablet by mouth daily.      . budesonide-formoterol (SYMBICORT) 80-4.5 MCG/ACT inhaler Inhale 2 puffs into the lungs 2 (two) times daily.  1 Inhaler  12  .  clonazePAM (KLONOPIN) 1 MG tablet 1/2-1 tab bid prn anxiety  60 tablet  0  . cyclobenzaprine (FLEXERIL) 5 MG tablet 1-2 tabs po q8h prn  30 tablet  2  . QUASENSE 0.15-0.03 MG tablet         Allergies  Allergen Reactions  . Hydrocodone-Acetaminophen     Pt states she has itching after taking large quantities.  OK prn.    ROS As per HPI  PE: Blood pressure 127/78, pulse 93, height 5\' 4"  (1.626 m), weight 222 lb (100.699 kg). Gen: Alert, well appearing.  Patient is oriented to person, place, time, and situation. ENT: Ears: EACs clear, normal epithelium.  TMs with good light reflex and landmarks bilaterally.  Eyes: no injection, icteris, swelling, or exudate.  EOMI, PERRLA. Nose: no drainage or turbinate edema/swelling.  No injection or focal lesion.  Mouth: lips without lesion/swelling.  Oral mucosa pink and moist.   Dentition intact and without obvious caries or gingival swelling.  Oropharynx without erythema, exudate, or swelling.  Neck - No masses or thyromegaly or limitation in range of motion CV: RRR, no m/r/g.   LUNGS: CTA bilat, nonlabored resps, good aeration in all lung fields. ABD: soft, NT, ND, BS normal.  No hepatospenomegaly or mass.  No bruits. EXT: no clubbing or cyanosis.  2+ LE edema below knees down into ankles bilat. Musculoskeletal: no joint swelling, erythema, warmth, or tenderness.  ROM of all joints intact. Mild diffuse lumbosacral region TTP in paraspinous soft tissue regions bilat. Mild left greater troch area TTP Left thumb with mild MTP joint TTP. Skin - no sores or suspicious lesions or rashes or color changes  LABS:  Lab Results  Component Value Date   WBC 6.8 07/21/2011   HGB 13.0 07/21/2011   HCT 40.1 07/21/2011   MCV 81.0 07/21/2011   PLT 288 07/21/2011     Chemistry      Component Value Date/Time   NA 136 07/21/2011 1526   K 4.0 07/21/2011 1526   CL 103 07/21/2011 1526   CO2 22 07/21/2011 1526   BUN 14 07/21/2011 1526   CREATININE 0.87 07/21/2011 1526   CREATININE 0.8 07/17/2010 0955      Component Value Date/Time   CALCIUM 9.1 07/21/2011 1526   ALKPHOS 77 07/21/2011 1526   AST 36 07/21/2011 1526   ALT 31 07/21/2011 1526   BILITOT 0.4 07/21/2011 1526     Lab Results  Component Value Date   ANA NEG 07/21/2011   RF <10 07/21/2011   Lab Results  Component Value Date   ESRSEDRATE 9 07/21/2011   Lab Results  Component Value Date   CKTOTAL 72 07/21/2011   Lab Results  Component Value Date   VITAMINB12 822 07/21/2011   Lab Results  Component Value Date   TSH 0.429 07/21/2011   Urinalysis 07/17/11 was normal.   IMPRESSION AND PLAN:  Fibromyalgia syndrome Discussed dx, answered questions. Plan is to start celebrex 200mg  qd trial.  Therapeutic expectations and side effect profile of medication discussed today.  Patient's questions answered. Start trial of  Lyrica: Take one 50mg  cap three times a day for 7d, then increase to TWO of the 50mg  caps three times a day for 7d, then increase to THREE caps three times per day (may take with OR without food). [facility] info given to pt today and she'll review this and call back if she wants a referral for this. F/u in 3 wks.   HYPERTENSION, ESSENTIAL NOS Problem stable.  Continue current medications and diet appropriate for this condition.  We have reviewed our general long term plan for this problem and also reviewed symptoms and signs that should prompt the patient to call or return to the office. Low Na diet reviewed.       FOLLOW UP: Return in about 3 weeks (around 08/24/2011) for f/u fibromyalgia.

## 2011-08-05 ENCOUNTER — Encounter: Payer: Self-pay | Admitting: Family Medicine

## 2011-08-05 ENCOUNTER — Telehealth: Payer: Self-pay | Admitting: Family Medicine

## 2011-08-05 ENCOUNTER — Telehealth: Payer: Self-pay | Admitting: *Deleted

## 2011-08-05 DIAGNOSIS — M797 Fibromyalgia: Secondary | ICD-10-CM | POA: Insufficient documentation

## 2011-08-05 NOTE — Telephone Encounter (Signed)
Pls call [facility] and see if they would send resp therapy out to Garland's house and do a check of her CPAP (she doesn't recall where or when she got her sleep study or her CPAP machine and I just want them to do a general check of her machine and if they could set up a way to monitor effectiveness and compliance that would be great.  I don't know what they offer along these lines....--thx

## 2011-08-05 NOTE — Assessment & Plan Note (Addendum)
Discussed dx, answered questions. Plan is to start celebrex 200mg  qd trial.  Therapeutic expectations and side effect profile of medication discussed today.  Patient's questions answered. Start trial of Lyrica: Take one 50mg  cap three times a day for 7d, then increase to TWO of the 50mg  caps three times a day for 7d, then increase to THREE caps three times per day (may take with OR without food). [facility] info given to pt today and she'll review this and call back if she wants a referral for this. F/u in 3 wks.

## 2011-08-05 NOTE — Telephone Encounter (Signed)
Referral ordered--PM 

## 2011-08-05 NOTE — Telephone Encounter (Signed)
Pt has looked through [facility] materials and thinks it looks wonderful.  Would like referral.

## 2011-08-05 NOTE — Assessment & Plan Note (Signed)
Problem stable.  Continue current medications and diet appropriate for this condition.  We have reviewed our general long term plan for this problem and also reviewed symptoms and signs that should prompt the patient to call or return to the office. Low Na diet reviewed.

## 2011-08-06 NOTE — Telephone Encounter (Signed)
Dr. Milinda Cave reviewed chart and found patient has seen Dr. Vassie Loll in the past.  We will refer back to Dr. Reginia Naas office for follow up of sleep apnea. Also advised pt that referral to [facility] has been made and they will call her.  She may also call them now that they have her referral information.  Pt will give them a call.

## 2011-08-10 NOTE — Telephone Encounter (Signed)
Noted.  Agree--PM 

## 2011-08-24 ENCOUNTER — Other Ambulatory Visit: Payer: Self-pay

## 2011-08-24 ENCOUNTER — Ambulatory Visit: Payer: BC Managed Care – PPO | Admitting: Family Medicine

## 2011-08-24 ENCOUNTER — Other Ambulatory Visit: Payer: Self-pay | Admitting: *Deleted

## 2011-08-24 MED ORDER — LOSARTAN POTASSIUM-HCTZ 100-25 MG PO TABS: 1.0000 | ORAL_TABLET | Freq: Every day | ORAL | Status: DC

## 2011-08-24 MED ORDER — CELECOXIB 200 MG PO CAPS: 200.0000 mg | ORAL_CAPSULE | Freq: Every day | ORAL | Status: DC

## 2011-08-24 MED ORDER — PREGABALIN 150 MG PO CAPS: 150.0000 mg | ORAL_CAPSULE | Freq: Three times a day (TID) | ORAL | Status: DC

## 2011-08-24 NOTE — Telephone Encounter (Signed)
OK to give patient samples for now and then if she wants to stay on it still in a couple weeks can call for further samples if available or samples

## 2011-08-24 NOTE — Telephone Encounter (Signed)
Pt requests refill on Celebrex.  She was given samples at last visit and will run out of those samples prior to appt.  She was scheduled to see Dr. Milinda Cave today, but had to be rescheduled.  Pt takes once daily.  Please advise refills.

## 2011-08-24 NOTE — Telephone Encounter (Signed)
Just check with patient and make sure she is taking the 150 mg  Lyrica 1 tab tid and then you can send in refill that pharmacy is requesting with 3 refill

## 2011-08-24 NOTE — Telephone Encounter (Signed)
Can you please advise the Lyrica RX? I see Lyrica 150 mg and Lyrica 50 mg? The pharmacy is requesting Lyrica 150 mg tid?

## 2011-08-24 NOTE — Telephone Encounter (Signed)
Patient informed that samples are at front desk.

## 2011-08-24 NOTE — Telephone Encounter (Signed)
Pt states she is taking Lyrica 150 mg 1 tab tid. RX sent to pharmacy

## 2011-09-10 ENCOUNTER — Ambulatory Visit (INDEPENDENT_AMBULATORY_CARE_PROVIDER_SITE_OTHER): Payer: BC Managed Care – PPO | Admitting: Family Medicine

## 2011-09-10 ENCOUNTER — Encounter: Payer: Self-pay | Admitting: Family Medicine

## 2011-09-10 VITALS — BP 101/67 | HR 70 | Ht 64.0 in | Wt 224.0 lb

## 2011-09-10 DIAGNOSIS — I10 Essential (primary) hypertension: Secondary | ICD-10-CM

## 2011-09-10 DIAGNOSIS — J309 Allergic rhinitis, unspecified: Secondary | ICD-10-CM

## 2011-09-10 DIAGNOSIS — IMO0001 Reserved for inherently not codable concepts without codable children: Secondary | ICD-10-CM

## 2011-09-10 DIAGNOSIS — M797 Fibromyalgia: Secondary | ICD-10-CM

## 2011-09-10 DIAGNOSIS — J300 Vasomotor rhinitis: Secondary | ICD-10-CM

## 2011-09-10 MED ORDER — CELECOXIB 200 MG PO CAPS: 200.0000 mg | ORAL_CAPSULE | Freq: Every day | ORAL | Status: DC

## 2011-09-10 MED ORDER — AZELASTINE HCL 0.15 % NA SOLN: NASAL | Status: DC

## 2011-09-10 NOTE — Assessment & Plan Note (Signed)
Problem stable.  Continue current medications and diet appropriate for this condition--she may continue to leave norvasc off since this caused her too much problem with fluid retention.  We have reviewed our general long term plan for this problem and also reviewed symptoms and signs that should prompt the patient to call or return to the office.

## 2011-09-10 NOTE — Assessment & Plan Note (Signed)
D/C flonase. Do trial of astepro 2 sprays each nostril qhs. I suspect her taste and smell sensory loss may be related to postsurgical changes from her previous sinus surgeries. I certainly see no sign of nasal inflammation or sinusitis currently.

## 2011-09-10 NOTE — Assessment & Plan Note (Signed)
Improving. Continue current meds and therapy at [facility]. Discussed general nature of fibromyalgia, expect some waxing and waning of sx's over time.   Very pleased with progress at this time, though.

## 2011-09-10 NOTE — Progress Notes (Signed)
OFFICE VISIT  09/10/2011   CC:  Chief Complaint  Patient presents with  . Follow-up    HTN, fibromyalgia     HPI:    Patient is a 51 y.o. Caucasian female who presents for 1 mo f/u fibromyalgia and HTN.  Feeling better overall, [facility] therapies very helpful, energy and attitude better. Body pain/achiness is less intense and comes more with overuse than with just doing nothing.  Lower back still hurting quite a bit.   No radiculopathy sx's.  She has oxycodone at home but rarely takes it.  She is back at work and feeling more at ease with things. Reports home bp readings normal.  She stopped norvasc b/c she felt like it was causing too much fluid retention.  She c/o insidious loss of taste and smell sensation over the last months/year.  Hx of nasal congestion only at night, causes some mild facial fullness/central forehead pressure.  Upon getting up in morning this all abates.  Distant hx of sinus problems, distant hx of nasal septum deviation repair.  CT sinuses as recently as 04/2011 by Dr. Beverely Low was unremarkable.   No sneezing, no nasal drainage, no ear pains/popping, no headaches, no upper teeth aching.  No fevers.  Past Medical History  Diagnosis Date  . GERD (gastroesophageal reflux disease)   . Hypertension   . Allergy     rhinitis  . History of cervical cancer     Dr. Rosalio Macadamia (carcinoma in situ): Laser and cone bx 1993, margins neg, paps wnl since.  . Hyperlipidemia     NMR 03/2009.Marland KitchenLDL 161(2735/1887)HDL 37,TG 271  . Subclinical hypothyroidism 06/19/11    TSH .29 at GYN (T4 and T3 wnl).  Repeat 07/21/11 wnl  . Sleep apnea     Dr. Vassie Loll  . Irregular menses   . Chronic fatigue     +excessive daytime somnolence  . Fibromyalgia syndrome   . Dysfunctional uterine bleeding     Metromenorrhagia+dysmenorrhea  . Obesity   . History of wrist fracture     left    Past Surgical History  Procedure Date  . Sinus surgery for septal deviation 1999    & polyps  . Wrist  surgery     left  . Knee arthroscopy 04/1988    x2,open procedure for hamstring tendon injury)  . Microdiscectomy lumbar 09/2003    L5/S1 left microdiscectomy (Dr. Noel Gerold)  . Plantar fascia release 02/2009    Bilateral (Dr. Simonne Come)  . Combined hysteroscopy diagnostic / d&c 03/2008    Done for thickened posterior endometrium found on w/u for menorrhagia.  Pathology-benign.  . Anal sphincterotomy 2007    for deep anal fissure (Dr. Luisa Hart)  . Hemorrhoid surgery 2006    Prolapsed internal hemorrhoids (Dr. Luisa Hart)  . 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 albuterol (VENTOLIN HFA) 108 (90 BASE) MCG/ACT inhaler Inhale 2 puffs into the lungs every 4 (four) hours as needed for wheezing or shortness of breath.  1 Inhaler  1  . Ascorbic Acid (VITAMIN C) 1000 MG tablet Take 1,000 mg by mouth daily.        . clonazePAM (KLONOPIN) 1 MG tablet 1/2-1 tab bid prn anxiety  60 tablet  0  . losartan-hydrochlorothiazide (HYZAAR) 100-25 MG per tablet Take 1 tablet by mouth daily.  30 tablet  3  . Nebivolol HCl (BYSTOLIC) 20 MG TABS Take 1 tablet (20 mg total)  by mouth daily.  30 tablet  1  . omeprazole (PRILOSEC) 20 MG capsule TAKE ONE CAPSULE BY MOUTH DAILY  90 capsule  3  . pregabalin (LYRICA) 150 MG capsule Take 1 capsule (150 mg total) by mouth 3 (three) times daily.  90 capsule  3  . topiramate (TOPIRAGEN) 25 MG tablet Take 25 mg by mouth daily.        . celecoxib (CELEBREX) 200 MG capsule Take 1 capsule (200 mg total) by mouth daily.  12 capsule  0  . fluticasone (FLONASE) 50 MCG/ACT nasal spray Place 2 sprays into the nose daily.  16 g  1  . QUASENSE 0.15-0.03 MG tablet       . amLODipine (NORVASC) 10 MG tablet Take 1 tablet (10 mg total) by mouth daily.  30 tablet  6  . budesonide-formoterol (SYMBICORT) 80-4.5 MCG/ACT inhaler Inhale 2 puffs into the lungs 2 (two) times daily.  1 Inhaler  12  .  cyclobenzaprine (FLEXERIL) 5 MG tablet 1-2 tabs po q8h prn  30 tablet  2  . Vilazodone HCl (VIIBRYD) 40 MG TABS Take 1 tablet (40 mg total) by mouth daily.  30 tablet  0  **Currently not taking viibryd, flexeril, symbicort, norvasc, or flonase,   Allergies  Allergen Reactions  . Hydrocodone-Acetaminophen     Pt states she has itching after taking large quantities.  OK prn.    ROS As per HPI  PE: Blood pressure 101/67, pulse 70, height 5\' 4"  (1.626 m), weight 224 lb (101.606 kg). Gen: Alert, well appearing.  Patient is oriented to person, place, time, and situation. ENT: Ears: EACs clear, normal epithelium.  TMs with good light reflex and landmarks bilaterally.  Eyes: no injection, icteris, swelling, or exudate.  EOMI, PERRLA. Nose: no drainage.  Mild turbinate fullness but no injection or obstruction to airflow.   No focal lesion or notable septal deviation.  No tenderness of paranasal sinuses, no swelling of face/eyes.  Mouth: lips without lesion/swelling.  Oral mucosa pink and moist.  Dentition intact and without obvious caries or gingival swelling.  Oropharynx without erythema, exudate, or swelling.  Neck - No masses or thyromegaly or limitation in range of motion CV: RRR, no m/r/g.   LUNGS: CTA bilat, nonlabored resps, good aeration in all lung fields. MUSC: mild TTP and tightness in trapezius and scapular areas bilat, also in L-spine paraspinous soft tissues bilaterally.    LABS:  none  IMPRESSION AND PLAN:  Fibromyalgia syndrome Improving. Continue current meds and therapy at [facility]. Discussed general nature of fibromyalgia, expect some waxing and waning of sx's over time.   Very pleased with progress at this time, though.  HYPERTENSION, ESSENTIAL NOS Problem stable.  Continue current medications and diet appropriate for this condition--she may continue to leave norvasc off since this caused her too much problem with fluid retention.  We have reviewed our  general long term plan for this problem and also reviewed symptoms and signs that should prompt the patient to call or return to the office.   Vasomotor rhinitis D/C flonase. Do trial of astepro 2 sprays each nostril qhs. I suspect her taste and smell sensory loss may be related to postsurgical changes from her previous sinus surgeries. I certainly see no sign of nasal inflammation or sinusitis currently.     FOLLOW UP: Return in about 2 months (around 11/10/2011) for f/u fibromyalgia and vasomotor rhinitis.

## 2011-09-30 ENCOUNTER — Other Ambulatory Visit: Payer: Self-pay | Admitting: *Deleted

## 2011-09-30 MED ORDER — NEBIVOLOL HCL 20 MG PO TABS: 1.0000 | ORAL_TABLET | Freq: Every day | ORAL | Status: DC

## 2011-09-30 NOTE — Telephone Encounter (Signed)
Faxed refill request received from pharmacy for bystolic  Last filled by MD on 07/10/11, 30 x 1 Last seen on 09/10/11 Follow up 11/11/11 RX sent.

## 2011-10-23 ENCOUNTER — Encounter: Payer: Self-pay | Admitting: Family Medicine

## 2011-10-26 ENCOUNTER — Other Ambulatory Visit: Payer: Self-pay | Admitting: *Deleted

## 2011-10-26 MED ORDER — CLONAZEPAM 1 MG PO TABS: ORAL_TABLET | ORAL | Status: DC

## 2011-10-26 NOTE — Telephone Encounter (Signed)
Faxed refill request received from pharmacy for CLONAZEPAM Last filled by MD on 07/07/11, #[phone] X 0 Last seen on 09/10/11 Follow up scheduled for 11/11/11 Please advise refills.

## 2011-11-11 ENCOUNTER — Encounter: Payer: Self-pay | Admitting: Family Medicine

## 2011-11-11 ENCOUNTER — Ambulatory Visit (INDEPENDENT_AMBULATORY_CARE_PROVIDER_SITE_OTHER): Payer: BC Managed Care – PPO | Admitting: Family Medicine

## 2011-11-11 VITALS — BP 118/71 | HR 67 | Ht 64.0 in | Wt 230.0 lb

## 2011-11-11 DIAGNOSIS — M797 Fibromyalgia: Secondary | ICD-10-CM

## 2011-11-11 DIAGNOSIS — E785 Hyperlipidemia, unspecified: Secondary | ICD-10-CM

## 2011-11-11 DIAGNOSIS — E8881 Metabolic syndrome: Secondary | ICD-10-CM

## 2011-11-11 DIAGNOSIS — IMO0001 Reserved for inherently not codable concepts without codable children: Secondary | ICD-10-CM

## 2011-11-11 DIAGNOSIS — I10 Essential (primary) hypertension: Secondary | ICD-10-CM

## 2011-11-11 DIAGNOSIS — R635 Abnormal weight gain: Secondary | ICD-10-CM | POA: Insufficient documentation

## 2011-11-11 LAB — CBC WITH DIFFERENTIAL/PLATELET
Basophils Relative: 0.7 % (ref 0.0–3.0)
Eosinophils Absolute: 0.1 10*3/uL (ref 0.0–0.7)
HCT: 37.7 % (ref 36.0–46.0)
Hemoglobin: 12.4 g/dL (ref 12.0–15.0)
Lymphs Abs: 2.2 10*3/uL (ref 0.7–4.0)
MCHC: 33 g/dL (ref 30.0–36.0)
MCV: 81.3 fl (ref 78.0–100.0)
Monocytes Absolute: 0.6 10*3/uL (ref 0.1–1.0)
Neutro Abs: 4.4 10*3/uL (ref 1.4–7.7)
Neutrophils Relative %: 59.8 % (ref 43.0–77.0)
RBC: 4.64 Mil/uL (ref 3.87–5.11)

## 2011-11-11 NOTE — Progress Notes (Signed)
OFFICE NOTE  11/11/2011  CC:  Chief Complaint  Patient presents with  . Follow-up     HPI: Patient is a 51 y.o. Caucasian female who is here for 2 mo f/u fibromyalgia and HTN. Doing fair, compliant with meds, unable to do exercise/therapy lately due to cost--she doesn't get a paycheck in the summer.  She hopes to restart integrative therapies in the fall. She expresses frustration with weight gain (up 20 lbs since X-mas time per her report), and she says her diet is "good" and her activity level during this time period has actually been increased compared to her past.   Pertinent PMH:  Past Medical History  Diagnosis Date  . GERD (gastroesophageal reflux disease)   . Hypertension   . Allergy     rhinitis  . History of cervical cancer     Dr. Rosalio Macadamia (carcinoma in situ): Laser and cone bx 1993, margins neg, paps wnl since.  . Hyperlipidemia     NMR 03/2009.Marland KitchenLDL 161(2735/1887)HDL 37,TG 271  . Subclinical hyperthyroidism 06/19/11    TSH .29 at GYN (T4 and T3 wnl).  Repeat 07/21/11 wnl  . Sleep apnea     Dr. Vassie Loll  . Chronic fatigue     +excessive daytime somnolence  . Fibromyalgia syndrome   . Dysfunctional uterine bleeding     Metromenorrhagia+dysmenorrhea  . Obesity   . History of wrist fracture     left  . Disturbance of smell and taste 2013    ENT eval (Dr. Hassel Neth) 10/2011 --recommended flonase, afrin, mucinex, saline nasal rinse and then do intracranial imaging if none of that helped in 2 mo.  [name] METABOLIC SYNDROME X 11/26/2008    Qualifier: Diagnosis of  By: Alwyn Ren MD, Chrissie Noa   Elevated trigs, HTN, elevated waist circumference.    Past surgical, social, and family history reviewed and no changes noted since last office visit.  MEDS:  Outpatient Prescriptions Prior to Visit  Medication Sig Dispense Refill  . oxyCODONE (OXY IR/ROXICODONE) 5 MG immediate release tablet 1-2 tabs po q6h prn pain  30 tablet  0  . albuterol (VENTOLIN HFA) 108 (90 BASE) MCG/ACT  inhaler Inhale 2 puffs into the lungs every 4 (four) hours as needed for wheezing or shortness of breath.  1 Inhaler  1  . celecoxib (CELEBREX) 200 MG capsule Take 1 capsule (200 mg total) by mouth daily.  30 capsule  6  . clonazePAM (KLONOPIN) 1 MG tablet 1/2-1 tab bid prn anxiety  60 tablet  0  . DULoxetine (CYMBALTA) 60 MG capsule Take 60 mg by mouth daily.      [name] losartan-hydrochlorothiazide (HYZAAR) 100-25 MG per tablet Take 1 tablet by mouth daily.  30 tablet  3  . Nebivolol HCl (BYSTOLIC) 20 MG TABS Take 1 tablet (20 mg total) by mouth daily.  30 tablet  3  . omeprazole (PRILOSEC) 20 MG capsule TAKE ONE CAPSULE BY MOUTH DAILY  90 capsule  3  . pregabalin (LYRICA) 150 MG capsule Take 1 capsule (150 mg total) by mouth 3 (three) times daily.  90 capsule  3  . topiramate (TOPIRAGEN) 25 MG tablet Take 25 mg by mouth daily.        . Ascorbic Acid (VITAMIN C) 1000 MG tablet Take 1,000 mg by mouth daily.        . Azelastine HCl (ASTEPRO) 0.15 % SOLN 2 sprays in each nostril qhs for nasal congestion  30 mL  6  . QUASENSE 0.15-0.03 MG tablet  PE: Blood pressure 118/71, pulse 67, height 5\' 4"  (1.626 m), weight 230 lb (104.327 kg). Gen: Alert, well appearing, obese white female.  Patient is oriented to person, place, time, and situation. Affect: pleasant.  Thought and conversation are lucid. I don't appreciate any abnl facial hair. CV: RRR, no m/r/g.   LUNGS: CTA bilat, nonlabored resps, good aeration in all lung fields.  IMPRESSION AND PLAN:  HYPERTENSION, ESSENTIAL NOS Problem stable.  Continue current medications and diet appropriate for this condition.  We have reviewed our general long term plan for this problem and also reviewed symptoms and signs that should prompt the patient to call or return to the office.   Fibromyalgia syndrome Problem stable.  Continue current medications and diet appropriate for this condition.  We have reviewed our general long term plan for this problem  and also reviewed symptoms and signs that should prompt the patient to call or return to the office. Hopefully she can get back into the habit of going to [facility].  Weight gain, abnormal Most likely a matter of calories in > calories burned.  Also, she is on Lyrica, which can commonly be associated with abnl wt gain.  However, with her hx of DUB, mild hirsutism, metabolic syndrome, and now wt gain issues, I'll check a few labs to see if there is any suggestion of polycystic ovarian syndrome. (She is fasting today).  Other and unspecified hyperlipidemia Reviewing last NMR lipoprofile and fasting lipid panel (04/2010), she should be on a statin.  Unclear medication history. Will recheck FLP today.     FOLLOW UP: 72mo

## 2011-11-11 NOTE — Assessment & Plan Note (Addendum)
Reviewing last NMR lipoprofile and fasting lipid panel (04/2010), she should be on a statin.  Unclear medication history. Will recheck FLP today.

## 2011-11-11 NOTE — Assessment & Plan Note (Addendum)
Most likely a matter of calories in > calories burned.  Also, she is on Lyrica, which can commonly be associated with abnl wt gain.  However, with her hx of DUB, mild hirsutism, metabolic syndrome, and now wt gain issues, I'll check a few labs to see if there is any suggestion of polycystic ovarian syndrome. (She is fasting today).

## 2011-11-11 NOTE — Assessment & Plan Note (Signed)
Problem stable.  Continue current medications and diet appropriate for this condition.  We have reviewed our general long term plan for this problem and also reviewed symptoms and signs that should prompt the patient to call or return to the office. Hopefully she can get back into the habit of going to [facility].

## 2011-11-11 NOTE — Assessment & Plan Note (Signed)
Problem stable.  Continue current medications and diet appropriate for this condition.  We have reviewed our general long term plan for this problem and also reviewed symptoms and signs that should prompt the patient to call or return to the office.  

## 2011-11-12 LAB — COMPREHENSIVE METABOLIC PANEL
ALT: 17 U/L (ref 0–35)
Albumin: 3.7 g/dL (ref 3.5–5.2)
CO2: 26 mEq/L (ref 19–32)
Chloride: 102 mEq/L (ref 96–112)
GFR: 70.86 mL/min (ref >60.00)
Glucose, Bld: 92 mg/dL (ref 70–99)
Potassium: 4.2 mEq/L (ref 3.5–5.1)
Sodium: 137 mEq/L (ref 135–145)
Total Protein: 6.8 g/dL (ref 6.0–8.3)

## 2011-11-12 LAB — PROLACTIN: Prolactin: 7.2 ng/mL

## 2011-11-12 LAB — CORTISOL: Cortisol, Plasma: 4.4 ug/dL

## 2011-11-12 LAB — LIPID PANEL: Total CHOL/HDL Ratio: 6

## 2011-11-12 LAB — LDL CHOLESTEROL, DIRECT: Direct LDL: 178.1 mg/dL

## 2011-11-16 ENCOUNTER — Telehealth: Payer: Self-pay | Admitting: Family Medicine

## 2011-11-16 MED ORDER — ATORVASTATIN CALCIUM 10 MG PO TABS: 10.0000 mg | ORAL_TABLET | Freq: Every day | ORAL | Status: DC

## 2011-11-16 NOTE — Telephone Encounter (Signed)
Sent atorvastatin to her pharmacy.

## 2011-12-03 ENCOUNTER — Encounter: Payer: Self-pay | Admitting: Family Medicine

## 2011-12-03 ENCOUNTER — Ambulatory Visit (INDEPENDENT_AMBULATORY_CARE_PROVIDER_SITE_OTHER): Payer: BC Managed Care – PPO | Admitting: Family Medicine

## 2011-12-03 VITALS — BP 110/68 | HR 78 | Ht 64.0 in | Wt 233.0 lb

## 2011-12-03 DIAGNOSIS — M797 Fibromyalgia: Secondary | ICD-10-CM

## 2011-12-03 DIAGNOSIS — E0590 Thyrotoxicosis, unspecified without thyrotoxic crisis or storm: Secondary | ICD-10-CM

## 2011-12-03 DIAGNOSIS — I10 Essential (primary) hypertension: Secondary | ICD-10-CM

## 2011-12-03 DIAGNOSIS — IMO0001 Reserved for inherently not codable concepts without codable children: Secondary | ICD-10-CM

## 2011-12-03 NOTE — Patient Instructions (Signed)
Take your klonopin 1mg  tab, 1/2 tab twice per day for 3d, then increase to 1 whole tab twice per day. Stop lipitor.

## 2011-12-03 NOTE — Progress Notes (Signed)
OFFICE VISIT  12/06/2011   CC:  Chief Complaint  Patient presents with  . Fibromyalgia    flare, BP up     HPI:    Patient is a 51 y.o. Caucasian female who presents for "fibromyalgia flare". Says for the past 2+ wks has felt excessive full body pain, most severe in low back, severe fatigue and daytime somnolence, cognitive slowing that she describes as "fibro fog".  Very frustrated with her inability to lose wt.  Says she got very aggressive with her exercise 2+ wks ago, cardio for 45 min 3 days per week, less intense pilates and yoga on the other days.  Also started atorvastatin 10mg  qd at about the time all of these sx's got worse 2+ wks ago.  Admits to being under lots more stress than usual, overall very frustrated with her life, but she denies being depressed. She is seeing a counselor whom she is talking with about all of these things.   Has oxycodone to take for her most severe pain periods but says she rarely resorts to this med b/c of fear of dependence.   She takes her clonazepam only occasionally, and mainly at night b/c of the way it sedates her.    ROS: no joint redness or swelling, no rash, no abd pain, no HA's, no chest pain or SOB. She has been using her CPAP nightly as usual.  No change in urinary or bowel function.  Menses have been less often and irregular for the last 2 yrs.   Past Medical History  Diagnosis Date  . GERD (gastroesophageal reflux disease)   . Hypertension   . Allergy     rhinitis  . History of cervical cancer     Dr. Rosalio Macadamia (carcinoma in situ): Laser and cone bx 1993, margins neg, paps wnl since.  . Hyperlipidemia     NMR 03/2009.Marland KitchenLDL 161(2735/1887)HDL 37,TG 271  . Subclinical hyperthyroidism 06/19/11    TSH .29 at GYN (T4 and T3 wnl).  Repeat 07/21/11 wnl  . Sleep apnea     Dr. Vassie Loll  . Chronic fatigue     +excessive daytime somnolence  . Fibromyalgia syndrome   . Dysfunctional uterine bleeding     Metromenorrhagia+dysmenorrhea  . Obesity    . History of wrist fracture     left  . Disturbance of smell and taste 2013    ENT eval (Dr. Hassel Neth) 10/2011 --recommended flonase, afrin, mucinex, saline nasal rinse and then do intracranial imaging if none of that helped in 2 mo.  [name] METABOLIC SYNDROME X 11/26/2008    Qualifier: Diagnosis of  By: Alwyn Ren MD, Chrissie Noa   Elevated trigs, HTN, elevated waist circumference.     Past Surgical History  Procedure Date  . Sinus surgery for septal deviation 1999    & polyps  . Wrist surgery     left  . Knee arthroscopy 04/1988    x2,open procedure for hamstring tendon injury)  . Microdiscectomy lumbar 09/2003    L5/S1 left microdiscectomy (Dr. Noel Gerold)  . Plantar fascia release 02/2009    Bilateral (Dr. Simonne Come)  . Combined hysteroscopy diagnostic / d&c 03/2008    Done for thickened posterior endometrium found on w/u for menorrhagia.  Pathology-benign.  . Anal sphincterotomy 2007    for deep anal fissure (Dr. Luisa Hart)  . Hemorrhoid surgery 2006    Prolapsed internal hemorrhoids (Dr. Luisa Hart)  . 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 albuterol (VENTOLIN HFA) 108 (90 BASE) MCG/ACT inhaler Inhale 2 puffs into the lungs every 4 (four) hours as needed for wheezing or shortness of breath.  1 Inhaler  1  . celecoxib (CELEBREX) 200 MG capsule Take 1 capsule (200 mg total) by mouth daily.  30 capsule  6  . clonazePAM (KLONOPIN) 1 MG tablet 1/2-1 tab bid prn anxiety  60 tablet  0  . DULoxetine (CYMBALTA) 60 MG capsule Take 60 mg by mouth daily.      . fluticasone (FLONASE) 50 MCG/ACT nasal spray       . losartan-hydrochlorothiazide (HYZAAR) 100-25 MG per tablet Take 1 tablet by mouth daily.  30 tablet  3  . methocarbamol (ROBAXIN) 500 MG tablet Take 2 tablets by mouth Twice daily.      . Nebivolol HCl (BYSTOLIC) 20 MG TABS Take 1 tablet (20 mg total) by mouth daily.  30 tablet  3  .  omeprazole (PRILOSEC) 20 MG capsule TAKE ONE CAPSULE BY MOUTH DAILY  90 capsule  3  . oxyCODONE (OXY IR/ROXICODONE) 5 MG immediate release tablet 1-2 tabs po q6h prn pain  30 tablet  0  . pregabalin (LYRICA) 150 MG capsule Take 1 capsule (150 mg total) by mouth 3 (three) times daily.  90 capsule  3  . topiramate (TOPIRAGEN) 25 MG tablet Take 25 mg by mouth daily.        [name] atorvastatin (LIPITOR) 10 MG tablet Take 1 tablet (10 mg total) by mouth daily.  30 tablet  2    Allergies  Allergen Reactions  . Hydrocodone-Acetaminophen     Pt states she has itching after taking large quantities.  OK prn.    ROS As per HPI  PE: Blood pressure 110/68, pulse 78, height 5\' 4"  (1.626 m), weight 233 lb (105.688 kg). Gen: Alert, well appearing.  Patient is oriented to person, place, time, and situation. Affect: pleasant but exasperated, lucid thought and speech. ENT:  Eyes: no injection, icteris, swelling, or exudate.  EOMI, PERRLA. Nose: no drainage or turbinate edema/swelling.  No injection or focal lesion.  Mouth: lips without lesion/swelling.  Oral mucosa pink and moist.  Dentition intact and without obvious caries or gingival swelling.  Oropharynx without erythema, exudate, or swelling.  Neck - No masses or thyromegaly or limitation in range of motion CV: RRR, no m/r/g.   LUNGS: CTA bilat, nonlabored resps, good aeration in all lung fields. ABD: soft, NT, ND, BS normal.  No hepatospenomegaly or mass.  No bruits. EXT: no clubbing, cyanosis, or edema.  Musculoskeletal: tender everywhere from her neck down to her L/S spine.  Tender over both greater troch's, tender in shoulders and proximal upper limbs, tender diffusely in thighs/hamstrings.  No calf tenderness.    LABS:  None today  IMPRESSION AND PLAN:  Fibromyalgia syndrome She is certainly flaring, and I reassured her that I don't think anything new is going on and no drastic changes in her management should be attempted.  However, I  encouraged her to take her clonazepam bid and expect some sedation from it but this side effect should lessen in a week or so and hopefully this med will help some with her frustration/stress.   This flare also is coincidental with starting atorvastatin: will check CPK, CMET, and I recommended she stop atorvastatin for now. Finally, she does have a hx of slightly low TSH with normal T4 and T3.  Will repeat these labs today to rule out  thyroid dz as part of her recent worsening.   She'll back off some on the aggressive exercise, work her way back up slowly, continue good diet.  Encouraged pt as much as I could. If her worsening persists and labs are all good, we'll try to get her resp therapy supplier/pulmonologist to check on her CPAP setting and make sure she is titrated correctly still.  HYPERTENSION, ESSENTIAL NOS Initial BP today was up.  Recheck at end of visit was 110/68.  No change in therapy.  Monitor bp at home and call/return if not consistently normal.    FOLLOW UP: Return for appt already scheduled for 03/08/12.

## 2011-12-04 LAB — CBC WITH DIFFERENTIAL/PLATELET
Basophils Absolute: 0 10*3/uL (ref 0.0–0.1)
Eosinophils Absolute: 0.2 10*3/uL (ref 0.0–0.7)
Eosinophils Relative: 2 % (ref 0–5)
HCT: 37.6 % (ref 36.0–46.0)
Lymphocytes Relative: 31 % (ref 12–46)
MCH: 26.3 pg (ref 26.0–34.0)
MCV: 78 fL (ref 78.0–100.0)
Monocytes Absolute: 0.8 10*3/uL (ref 0.1–1.0)
Platelets: 322 10*3/uL (ref 150–400)
RDW: 15.3 % (ref 11.5–15.5)
WBC: 9.2 10*3/uL (ref 4.0–10.5)

## 2011-12-04 LAB — COMPREHENSIVE METABOLIC PANEL
Albumin: 4.1 g/dL (ref 3.5–5.2)
Alkaline Phosphatase: 89 U/L (ref 39–117)
BUN: 23 mg/dL (ref 6–23)
Calcium: 9.9 mg/dL (ref 8.4–10.5)
Chloride: 107 mEq/L (ref 96–112)
Creat: 0.83 mg/dL (ref 0.50–1.10)
Glucose, Bld: 90 mg/dL (ref 70–99)
Potassium: 4.3 mEq/L (ref 3.5–5.3)

## 2011-12-04 LAB — T3: T3, Total: 93.1 ng/dL (ref 80.0–204.0)

## 2011-12-04 LAB — T4, FREE: Free T4: 0.87 ng/dL (ref 0.80–1.80)

## 2011-12-06 DIAGNOSIS — E0590 Thyrotoxicosis, unspecified without thyrotoxic crisis or storm: Secondary | ICD-10-CM | POA: Insufficient documentation

## 2011-12-06 NOTE — Assessment & Plan Note (Signed)
She is certainly flaring, and I reassured her that I don't think anything new is going on and no drastic changes in her management should be attempted.  However, I encouraged her to take her clonazepam bid and expect some sedation from it but this side effect should lessen in a week or so and hopefully this med will help some with her frustration/stress.   This flare also is coincidental with starting atorvastatin: will check CPK, CMET, and I recommended she stop atorvastatin for now. Finally, she does have a hx of slightly low TSH with normal T4 and T3.  Will repeat these labs today to rule out thyroid dz as part of her recent worsening.   She'll back off some on the aggressive exercise, work her way back up slowly, continue good diet.  Encouraged pt as much as I could. If her worsening persists and labs are all good, we'll try to get her resp therapy supplier/pulmonologist to check on her CPAP setting and make sure she is titrated correctly still.

## 2011-12-06 NOTE — Assessment & Plan Note (Signed)
Initial BP today was up.  Recheck at end of visit was 110/68.  No change in therapy.  Monitor bp at home and call/return if not consistently normal.

## 2012-01-06 ENCOUNTER — Other Ambulatory Visit: Payer: Self-pay | Admitting: *Deleted

## 2012-01-06 NOTE — Telephone Encounter (Signed)
Faxed refill request received from pharmacy for Clonazepam Last filled by MD on 10/26/11, #60 x 0 Last seen on 12/03/11 and dose was increased to 1 mg BID. Follow up 03/08/12 Please advise refills.

## 2012-01-07 MED ORDER — CLONAZEPAM 1 MG PO TABS: 1.0000 mg | ORAL_TABLET | Freq: Two times a day (BID) | ORAL | Status: DC

## 2012-01-07 NOTE — Telephone Encounter (Signed)
RX faxed

## 2012-01-22 ENCOUNTER — Telehealth: Payer: Self-pay | Admitting: *Deleted

## 2012-01-22 MED ORDER — LOSARTAN POTASSIUM-HCTZ 100-25 MG PO TABS: 1.0000 | ORAL_TABLET | Freq: Every day | ORAL | Status: DC

## 2012-01-25 NOTE — Telephone Encounter (Signed)
Faxed refill request.  RX sent.

## 2012-02-22 ENCOUNTER — Ambulatory Visit (INDEPENDENT_AMBULATORY_CARE_PROVIDER_SITE_OTHER): Payer: BC Managed Care – PPO | Admitting: Pulmonary Disease

## 2012-02-22 ENCOUNTER — Ambulatory Visit: Payer: BC Managed Care – PPO | Admitting: Pulmonary Disease

## 2012-02-22 ENCOUNTER — Telehealth: Payer: Self-pay | Admitting: Pulmonary Disease

## 2012-02-22 ENCOUNTER — Encounter: Payer: Self-pay | Admitting: Pulmonary Disease

## 2012-02-22 VITALS — BP 122/70 | HR 103 | Temp 98.6°F | Ht 63.5 in | Wt 232.2 lb

## 2012-02-22 DIAGNOSIS — G4730 Sleep apnea, unspecified: Secondary | ICD-10-CM

## 2012-02-22 DIAGNOSIS — G4710 Hypersomnia, unspecified: Secondary | ICD-10-CM

## 2012-02-22 NOTE — Assessment & Plan Note (Addendum)
Fatigue/ EDS out of proportion to degree of OSA 6/10 PSG > mild sleep disorderde breathing, predominant hypopneas, RDI 16/h, AHI 5/h, lowest destauration 90%, no REM sleep. Fragmented sleep with long periods of awakening & low sleep efficiency  DD incl undertreated OSA, poor CPAP compliance, residual EDS or other cause of EDS such as fibromyalgia, medications or less likely narcolepsy  CPAP supplies Download in 1 month CPAP titration study Trial of melatonin 5 mg 3h before bedtime

## 2012-02-22 NOTE — Progress Notes (Signed)
  Subjective:    Patient ID: Sabrina [name], female    DOB: 03-02-1961, 51 y.o.   MRN: [id number]  HPI 46 /F for FU of obstructive sleep apnea.  She went through a long separation in 2003 & developed major depression, currently on effexor & abilify regimen. Epworth Sleepiness Score was 22/24 & she describes drowsiness in various social situations.Loud snoring & apneas have been witnessed by friends.Naps are non- refreshing. She has gained 30 lbs in th elast few years. Of note PSG in 10/03 did not show significant snoring or events.  she takes 70 mg of Lisdexamfetamine @ 8A & starts feeling tired again by 2 pm.  10/02/08>> discussed PSG > mild sleep disorderde breathing, predominant hypopneas, RDI 16/h, AHI 5/h, lowest destauration 90%, no REM sleep. Fragmented sleep with long periods of awakening & low sleep efficiency   April 08, 2009  reviewed compliance 9/24 - 10/12 >> good, avg pr 8.3  pressure ok, mask ok, some fatigue persists.  02/22/2012 3 y FU  last seen 04/2009. wears cpap everynight x 6 hrs a night. needs new supplies and possible pressure change. c/o feeling extremely tired all the time Fibromyalgia was diagnosed in interim - meds have changed - now off effexor , abilify, amphetamine. She is on cymbalta, robaxin & roxicodone instead Jerking movements when off cpap cpap 8cm on nasal pillows , she has not received new supplies in 3 years ! She is on methocarbamol about once daily & roxicodone prn only  Increased 25 lbs  Review of Systems neg for any significant sore throat, dysphagia, itching, sneezing, nasal congestion or excess/ purulent secretions, fever, chills, sweats, unintended wt loss, pleuritic or exertional cp, hempoptysis, orthopnea pnd or change in chronic leg swelling. Also denies presyncope, palpitations, heartburn, abdominal pain, nausea, vomiting, diarrhea or change in bowel or urinary habits, dysuria,hematuria, rash, arthralgias, visual complaints, headache,  numbness weakness or ataxia.     Objective:   Physical Exam  Gen. Pleasant, obese, in no distress ENT - no lesions, no post nasal drip Neck: No JVD, no thyromegaly, no carotid bruits Lungs: no use of accessory muscles, no dullness to percussion, decreased without rales or rhonchi  Cardiovascular: Rhythm regular, heart sounds  normal, no murmurs or gallops, no peripheral edema Musculoskeletal: No deformities, no cyanosis or clubbing , no tremors       Assessment & Plan:

## 2012-02-22 NOTE — Patient Instructions (Signed)
CPAP supplies Download in 1 monht CPAP titration study Trial of melatonin 5 mg 3h before bedtime

## 2012-02-22 NOTE — Telephone Encounter (Signed)
Pl let her know I would like her to also get a nap test in the daytime following the cpap study. This would mean staying in the lab for 30 mins naps , 2hours apart - so she would have to stay for the day after the study.

## 2012-02-23 ENCOUNTER — Ambulatory Visit: Payer: BC Managed Care – PPO | Admitting: Pulmonary Disease

## 2012-02-24 ENCOUNTER — Telehealth: Payer: Self-pay | Admitting: Emergency Medicine
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
## 2012-02-24 NOTE — Telephone Encounter (Signed)
Pt returned call. Call her at # last called on then try [phone]. Sabrina [name]

## 2012-02-24 NOTE — Telephone Encounter (Signed)
lmomtcb x1 

## 2012-02-25 NOTE — Telephone Encounter (Signed)
I spoke with pt and she has already been scheduled for this. Nothing further was needed

## 2012-02-29 ENCOUNTER — Telehealth: Payer: Self-pay

## 2012-02-29 ENCOUNTER — Other Ambulatory Visit: Payer: Self-pay | Admitting: Obstetrics & Gynecology

## 2012-02-29 DIAGNOSIS — Z1231 Encounter for screening mammogram for malignant neoplasm of breast: Secondary | ICD-10-CM

## 2012-02-29 MED ORDER — OXYCODONE HCL 5 MG PO TABS: ORAL_TABLET | ORAL | Status: DC

## 2012-02-29 NOTE — Telephone Encounter (Signed)
Pt called stating that she needs her Oxy refilled. Last RX was refilled 08-03-11 quantity 30 with 0 refills. Please advise?  Pt also would like a copy of labs. I put a copy in front cabinet

## 2012-02-29 NOTE — Telephone Encounter (Signed)
Pt informed and RX put in front cabinet

## 2012-02-29 NOTE — Telephone Encounter (Signed)
Rx printed for #30 oxycodone 5mg  tabs, no RF.-thx

## 2012-03-03 ENCOUNTER — Ambulatory Visit (INDEPENDENT_AMBULATORY_CARE_PROVIDER_SITE_OTHER): Payer: BC Managed Care – PPO

## 2012-03-03 DIAGNOSIS — Z1231 Encounter for screening mammogram for malignant neoplasm of breast: Secondary | ICD-10-CM

## 2012-03-08 ENCOUNTER — Encounter: Payer: Self-pay | Admitting: Family Medicine

## 2012-03-08 ENCOUNTER — Ambulatory Visit: Payer: BC Managed Care – PPO | Admitting: Family Medicine

## 2012-03-08 ENCOUNTER — Ambulatory Visit (INDEPENDENT_AMBULATORY_CARE_PROVIDER_SITE_OTHER): Payer: BC Managed Care – PPO | Admitting: Family Medicine

## 2012-03-08 VITALS — BP 125/82 | HR 98 | Temp 98.6°F | Ht 64.0 in | Wt 232.1 lb

## 2012-03-08 DIAGNOSIS — IMO0001 Reserved for inherently not codable concepts without codable children: Secondary | ICD-10-CM

## 2012-03-08 DIAGNOSIS — E785 Hyperlipidemia, unspecified: Secondary | ICD-10-CM

## 2012-03-08 DIAGNOSIS — I10 Essential (primary) hypertension: Secondary | ICD-10-CM

## 2012-03-08 DIAGNOSIS — G4710 Hypersomnia, unspecified: Secondary | ICD-10-CM

## 2012-03-08 DIAGNOSIS — M797 Fibromyalgia: Secondary | ICD-10-CM

## 2012-03-08 MED ORDER — CLONAZEPAM 1 MG PO TABS: 1.0000 mg | ORAL_TABLET | Freq: Two times a day (BID) | ORAL | Status: DC | PRN

## 2012-03-08 MED ORDER — LOSARTAN POTASSIUM-HCTZ 100-25 MG PO TABS: 1.0000 | ORAL_TABLET | Freq: Every day | ORAL | Status: DC

## 2012-03-08 MED ORDER — METHOCARBAMOL 500 MG PO TABS: ORAL_TABLET | ORAL | Status: DC
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
## 2012-03-08 NOTE — Assessment & Plan Note (Signed)
As per Dr. Reginia Naas orders, she has another sleep study set up to further evaluate this.

## 2012-03-08 NOTE — Assessment & Plan Note (Signed)
She had significant myalgias while on atorvastatin and is not interested in another trial of medication at this time.

## 2012-03-08 NOTE — Assessment & Plan Note (Signed)
At this point her fatigue component is greater than her myalgia component. She'll continue all current therapies and she'll keep appt for her repeat sleep study coming up (through Dr. Vassie Loll). Renewed clonazepam and robaxin rx's today.

## 2012-03-08 NOTE — Assessment & Plan Note (Signed)
Problem stable.  Continue current medications and diet appropriate for this condition.  We have reviewed our general long term plan for this problem and also reviewed symptoms and signs that should prompt the patient to call or return to the office. Lytes/Cr normal 12/2011.

## 2012-03-08 NOTE — Assessment & Plan Note (Signed)
Comorbidities: HTN, hyperlipidemia, and recurrent low back strain/pain. She has failed dietary-based wt loss and has severe limitations in exercise capacity due to her fibromyalgia syndrome. At patient's request I'll refer her for bariatric surgery consultation.

## 2012-03-09 ENCOUNTER — Other Ambulatory Visit: Payer: Self-pay | Admitting: Family Medicine

## 2012-03-09 ENCOUNTER — Telehealth: Payer: Self-pay | Admitting: Family Medicine

## 2012-03-09 DIAGNOSIS — IMO0001 Reserved for inherently not codable concepts without codable children: Secondary | ICD-10-CM

## 2012-03-09 NOTE — Telephone Encounter (Signed)
I've ordered the referral to Dr. [facility]. Please arrange appt and call Tanaja with details.  -thx

## 2012-03-10 NOTE — Telephone Encounter (Signed)
Return call by patient.  She is advised and wants appt made.

## 2012-03-10 NOTE — Telephone Encounter (Signed)
I have attempted to contact this patient by phone with the following results: left message to return my call on answering machine (home/mobile).  

## 2012-03-11 ENCOUNTER — Encounter: Payer: Self-pay | Admitting: Family Medicine

## 2012-03-11 ENCOUNTER — Ambulatory Visit (INDEPENDENT_AMBULATORY_CARE_PROVIDER_SITE_OTHER): Payer: BC Managed Care – PPO | Admitting: Family Medicine

## 2012-03-11 VITALS — BP 124/81 | HR 96 | Temp 98.8°F | Ht 64.0 in | Wt 231.8 lb

## 2012-03-11 DIAGNOSIS — R350 Frequency of micturition: Secondary | ICD-10-CM

## 2012-03-11 DIAGNOSIS — I10 Essential (primary) hypertension: Secondary | ICD-10-CM

## 2012-03-11 DIAGNOSIS — R109 Unspecified abdominal pain: Secondary | ICD-10-CM

## 2012-03-11 LAB — POCT URINALYSIS DIPSTICK
Bilirubin, UA: NEGATIVE
Blood, UA: NEGATIVE
Nitrite, UA: NEGATIVE
pH, UA: 7.5

## 2012-03-11 MED ORDER — NITROFURANTOIN MONOHYD MACRO 100 MG PO CAPS: 100.0000 mg | ORAL_CAPSULE | Freq: Two times a day (BID) | ORAL | Status: DC

## 2012-03-11 MED ORDER — PHENAZOPYRIDINE HCL 200 MG PO TABS: 200.0000 mg | ORAL_TABLET | Freq: Three times a day (TID) | ORAL | Status: DC | PRN

## 2012-03-11 NOTE — Assessment & Plan Note (Signed)
With urinary frequency, patient reports the worst pain/spasm just as she finishes emptying her bladder. Will send urine for culture and started on Macrobid and Pyridium, she is asked to increase hydration and cranberry and to seek further care as needed

## 2012-03-11 NOTE — Assessment & Plan Note (Signed)
Adequately controlled, no changes today 
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
## 2012-03-11 NOTE — Patient Instructions (Addendum)
Urinary Tract Infection Urinary tract infections (UTIs) can develop anywhere along your urinary tract. Your urinary tract is your body's drainage system for removing wastes and extra water. Your urinary tract includes two kidneys, two ureters, a bladder, and a urethra. Your kidneys are a pair of bean-shaped organs. Each kidney is about the size of your fist. They are located below your ribs, one on each side of your spine. CAUSES Infections are caused by microbes, which are microscopic organisms, including fungi, viruses, and bacteria. These organisms are so small that they can only be seen through a microscope. Bacteria are the microbes that most commonly cause UTIs. SYMPTOMS  Symptoms of UTIs may vary by age and gender of the patient and by the location of the infection. Symptoms in young women typically include a frequent and intense urge to urinate and a painful, burning feeling in the bladder or urethra during urination. Older women and men are more likely to be tired, shaky, and weak and have muscle aches and abdominal pain. A fever may mean the infection is in your kidneys. Other symptoms of a kidney infection include pain in your back or sides below the ribs, nausea, and vomiting. DIAGNOSIS To diagnose a UTI, your caregiver will ask you about your symptoms. Your caregiver also will ask to provide a urine sample. The urine sample will be tested for bacteria and white blood cells. White blood cells are made by your body to help fight infection. TREATMENT  Typically, UTIs can be treated with medication. Because most UTIs are caused by a bacterial infection, they usually can be treated with the use of antibiotics. The choice of antibiotic and length of treatment depend on your symptoms and the type of bacteria causing your infection. HOME CARE INSTRUCTIONS  If you were prescribed antibiotics, take them exactly as your caregiver instructs you. Finish the medication even if you feel better after you  have only taken some of the medication.  Drink enough water and fluids to keep your urine clear or pale yellow.  Avoid caffeine, tea, and carbonated beverages. They tend to irritate your bladder.  Empty your bladder often. Avoid holding urine for long periods of time.  Empty your bladder before and after sexual intercourse.  After a bowel movement, women should cleanse from front to back. Use each tissue only once. SEEK MEDICAL CARE IF:   You have back pain.  You develop a fever.  Your symptoms do not begin to resolve within 3 days. SEEK IMMEDIATE MEDICAL CARE IF:   You have severe back pain or lower abdominal pain.  You develop chills.  You have nausea or vomiting.  You have continued burning or discomfort with urination. MAKE SURE YOU:   Understand these instructions.  Will watch your condition.  Will get help right away if you are not doing well or get worse. Document Released: 01/28/2005 Document Revised: 10/20/2011 Document Reviewed: 05/29/2011 [employer] Patient Information 2013 [employer].  

## 2012-03-15 LAB — URINE CULTURE: Colony Count: 50000

## 2012-03-15 NOTE — Progress Notes (Signed)
Quick Note:  Patient Informed and voiced understanding ______ 

## 2012-03-16 ENCOUNTER — Encounter (HOSPITAL_BASED_OUTPATIENT_CLINIC_OR_DEPARTMENT_OTHER): Payer: BC Managed Care – PPO

## 2012-03-18 ENCOUNTER — Encounter: Payer: Self-pay | Admitting: Family Medicine

## 2012-03-18 ENCOUNTER — Telehealth: Payer: Self-pay | Admitting: Pulmonary Disease

## 2012-03-18 ENCOUNTER — Telehealth: Payer: Self-pay | Admitting: Family Medicine

## 2012-03-18 ENCOUNTER — Ambulatory Visit (INDEPENDENT_AMBULATORY_CARE_PROVIDER_SITE_OTHER): Payer: BC Managed Care – PPO | Admitting: Family Medicine

## 2012-03-18 VITALS — BP 131/80 | HR 106 | Temp 99.2°F | Ht 64.0 in | Wt 231.0 lb

## 2012-03-18 DIAGNOSIS — N390 Urinary tract infection, site not specified: Secondary | ICD-10-CM

## 2012-03-18 LAB — POCT URINALYSIS DIPSTICK
Blood, UA: NEGATIVE
Glucose, UA: NEGATIVE
Spec Grav, UA: >=1.03
Urobilinogen, UA: 0.2

## 2012-03-18 MED ORDER — LEVOFLOXACIN 500 MG PO TABS: 500.0000 mg | ORAL_TABLET | Freq: Every day | ORAL | Status: DC

## 2012-03-18 NOTE — Telephone Encounter (Signed)
Spoke with the pt She states needs to move appt with RA- she is set to see him in [place] on 03/22/12, but has MSLT scheduled this day, and also CPAP titration study the day before. I have transferred her back to the schedulers so that they can cancel ov with RA and move this out at least 2 wks so that way RA will have all of the results and be able to go over these with her.

## 2012-03-18 NOTE — Assessment & Plan Note (Signed)
Will treat with levaquin 500mg  qd x 10d for resistant/incompletely treated UTI. However, I discussed with pt that since some of her sx's were not classic for UTI and her urine clx last visit was only slightly positive + her degree of suprapubic pain---if not improved in 5d on this abx (or if today's clx neg) then we may need to pursue CT abd to r/o diverticulitis. She expressed understanding and said she would definitely call or return if not improving in 5d or if worsening prior to this.

## 2012-03-18 NOTE — Progress Notes (Signed)
OFFICE NOTE  03/18/2012  CC:  Chief Complaint  Patient presents with  . Urinary Tract Infection    frequency,      HPI: Patient is a 51 y.o. Caucasian female who is here for 1 week f/u UTI (neg UA but clx + enterococci 50K col/ml, sensitive to macrobid/ampicillin/leva.  Finished abx 2d/a, still having pulling-type suprapubic pains, last night lots of nocturia, daytime frequency as well.  No new location of her chronic pain in back but has been worse intensity.  No fever.  +nausea but no vomiting. Was very constipated but took MOM a few days ago an it solved the problem.  No vag d/c.  She did not take the AZO she was rx'd.   Pertinent PMH:  Past Medical History  Diagnosis Date  . GERD (gastroesophageal reflux disease)   . Hypertension   . Allergy     rhinitis  . History of cervical cancer     Dr. Rosalio Macadamia (carcinoma in situ): Laser and cone bx 1993, margins neg, paps wnl since.  . Hyperlipidemia     NMR 03/2009.Marland KitchenLDL 161(2735/1887)HDL 37,TG 271  . Subclinical hyperthyroidism 06/19/11    TSH .29 at GYN (T4 and T3 wnl).  Repeat 07/21/11 wnl  . Sleep apnea     Dr. Vassie Loll  . Chronic fatigue     +excessive daytime somnolence  . Fibromyalgia syndrome   . Dysfunctional uterine bleeding     Metromenorrhagia+dysmenorrhea  . Obesity   . History of wrist fracture     left  . Disturbance of smell and taste 2013    ENT eval (Dr. Hassel Neth) 10/2011 --recommended flonase, afrin, mucinex, saline nasal rinse and then do intracranial imaging if none of that helped in 2 mo.  [name] METABOLIC SYNDROME X 11/26/2008    Qualifier: Diagnosis of  By: Alwyn Ren MD, Chrissie Noa   Elevated trigs, HTN, elevated waist circumference.   . Migraine syndrome     topiramate has helped immensely  . Abdominal pain 03/11/2012    MEDS:  Outpatient Prescriptions Prior to Visit  Medication Sig Dispense Refill  . albuterol (VENTOLIN HFA) 108 (90 BASE) MCG/ACT inhaler Inhale 2 puffs into the lungs every 4 (four) hours  as needed for wheezing or shortness of breath.  1 Inhaler  1  . celecoxib (CELEBREX) 200 MG capsule Take 1 capsule (200 mg total) by mouth daily.  30 capsule  6  . Cholecalciferol (VITAMIN D3) 5000 UNITS TABS Take by mouth.      . clonazePAM (KLONOPIN) 1 MG tablet Take 1 tablet (1 mg total) by mouth 2 (two) times daily as needed.  60 tablet  5  . DULoxetine (CYMBALTA) 30 MG capsule 1 every morning and 2 in the PM      . fluticasone (FLONASE) 50 MCG/ACT nasal spray       . losartan-hydrochlorothiazide (HYZAAR) 100-25 MG per tablet Take 1 tablet by mouth daily.  30 tablet  6  . methocarbamol (ROBAXIN) 500 MG tablet 1/2 tablet during the day and 1 tablet at night  45 tablet  5  . Multiple Vitamins-Minerals (ALIVE ONCE DAILY WOMENS 50+ PO) Take by mouth daily.      . nitrofurantoin, macrocrystal-monohydrate, (MACROBID) 100 MG capsule Take 1 capsule (100 mg total) by mouth 2 (two) times daily.  10 capsule  0  . omeprazole (PRILOSEC) 20 MG capsule TAKE ONE CAPSULE BY MOUTH DAILY  90 capsule  3  . oxyCODONE (OXY IR/ROXICODONE) 5 MG immediate release tablet 1-2 tabs po  q6h prn pain  30 tablet  0  . phenazopyridine (PYRIDIUM) 200 MG tablet Take 1 tablet (200 mg total) by mouth 3 (three) times daily as needed for pain.  10 tablet  1  . topiramate (TOPIRAGEN) 25 MG tablet Take 25 mg by mouth daily.         Last reviewed on 03/11/2012  4:17 PM by Bradd Canary, MD  PE: Blood pressure 131/80, pulse 106, temperature 99.2 F (37.3 C), temperature source Temporal, height 5\' 4"  (1.626 m), weight 231 lb (104.781 kg), last menstrual period 06/12/2011, SpO2 97.00%. Gen: Alert, well appearing.  Patient is oriented to person, place, time, and situation. CV: RRR, no m/r/g.   LUNGS: CTA bilat, nonlabored resps, good aeration in all lung fields. ABD: soft, diffuse suprapubic and pelvic TTP R>L.   No tenderness elsewhere, BS normal. Back: no CVA tenderness.  LAB today: UA showed SG > 1.030, trace protein, otherwise  normal  IMPRESSION AND PLAN:  UTI (lower urinary tract infection) Will treat with levaquin 500mg  qd x 10d for resistant/incompletely treated UTI. However, I discussed with pt that since some of her sx's were not classic for UTI and her urine clx last visit was only slightly positive + her degree of suprapubic pain---if not improved in 5d on this abx (or if today's clx neg) then we may need to pursue CT abd to r/o diverticulitis. She expressed understanding and said she would definitely call or return if not improving in 5d or if worsening prior to this.  Sent urine for clx today.  An After Visit Summary was printed and given to the patient.   FOLLOW UP: prn

## 2012-03-18 NOTE — Telephone Encounter (Signed)
LMTCB x2  

## 2012-03-18 NOTE — Telephone Encounter (Signed)
Caller: [name]/Patient; Patient Name: Sabrina [name]; PCP: Earley Favor [facility]); Best Callback Phone Number: [phone], treated for 5 days with antibiotic for  UTI,  finished 03/16/12, this am frequency, able to urinate, afebrile, cramping like when first started with sx Urination Protocol Utilized.   03/18/12 0900 appt scheduled.

## 2012-03-18 NOTE — Telephone Encounter (Signed)
Noted  

## 2012-03-18 NOTE — Telephone Encounter (Signed)
[email or web address]  

## 2012-03-20 LAB — URINE CULTURE

## 2012-03-21 ENCOUNTER — Ambulatory Visit (HOSPITAL_BASED_OUTPATIENT_CLINIC_OR_DEPARTMENT_OTHER): Payer: BC Managed Care – PPO | Attending: Pulmonary Disease | Admitting: Radiology

## 2012-03-21 VITALS — Ht 63.0 in | Wt 232.0 lb

## 2012-03-21 DIAGNOSIS — E669 Obesity, unspecified: Secondary | ICD-10-CM | POA: Insufficient documentation

## 2012-03-21 DIAGNOSIS — G4710 Hypersomnia, unspecified: Secondary | ICD-10-CM

## 2012-03-21 DIAGNOSIS — Z79899 Other long term (current) drug therapy: Secondary | ICD-10-CM | POA: Insufficient documentation

## 2012-03-21 DIAGNOSIS — G4733 Obstructive sleep apnea (adult) (pediatric): Secondary | ICD-10-CM

## 2012-03-21 DIAGNOSIS — Z6841 Body Mass Index (BMI) 40.0 and over, adult: Secondary | ICD-10-CM | POA: Insufficient documentation

## 2012-03-22 ENCOUNTER — Ambulatory Visit: Payer: BC Managed Care – PPO | Admitting: Pulmonary Disease

## 2012-03-22 ENCOUNTER — Ambulatory Visit (HOSPITAL_BASED_OUTPATIENT_CLINIC_OR_DEPARTMENT_OTHER): Payer: BC Managed Care – PPO | Attending: Pulmonary Disease | Admitting: Radiology

## 2012-03-22 VITALS — Ht 63.0 in | Wt 232.0 lb

## 2012-03-22 DIAGNOSIS — G4730 Sleep apnea, unspecified: Secondary | ICD-10-CM

## 2012-03-22 DIAGNOSIS — G47419 Narcolepsy without cataplexy: Secondary | ICD-10-CM

## 2012-03-22 DIAGNOSIS — G4710 Hypersomnia, unspecified: Secondary | ICD-10-CM

## 2012-03-23 NOTE — Procedures (Signed)
Sabrina [name], CANADAY NO.:  [id number]  MEDICAL RECORD NO.:  [id number]          PATIENT TYPE:  OUT  LOCATION:  SLEEP CENTER                 FACILITY:  [facility]  PHYSICIAN:  Oretha Milch, MD      DATE OF BIRTH:  [birth year]  DATE OF STUDY:  03/21/2012                           NOCTURNAL POLYSOMNOGRAM  REFERRING PHYSICIAN:  Oretha Milch, MD  INDICATION FOR STUDY:  Sabrina [name] is a 51 year old obese woman with obstructive sleep apnea, who remain sleepy and extremely tired in spite of using CPAP at 8 cm on good compliance.  She has gained about 25 pounds since her last study.  Polysomnogram in 2010 showed mild sleep- disordered breathing with RDI of 16 per hour and lowest desaturation of 90%.  She also has major depression and fibromyalgia.  At the time of this study, she weighed 232 pounds with a height of 5 feet 3 inches, BMI of 41, neck size of 15.5 inches.  EPWORTH SLEEPINESS SCORE:  19.  MEDICATIONS:  Bedtime medications include methocarbamol, clonazepam, Celebrex, and Cymbalta.  Zyrtec, oxycodone, and topiramate in the daytime.  This CPAP titration study was performed with a sleep technologist in attendance.  EEG, EOG, EMG, EKG, and respiratory parameters were recorded.  Sleep stages, arousals, limb movements, and respiratory data were scored according to criteria laid out by the American Academy of Sleep Medicine.  SLEEP ARCHITECTURE:  Lights out was at 10:58 p.m.  Lights on was at 6:02 a.m.  Total sleep time was 405 minutes with a sleep period time of 415 minutes and sleep efficiency of 95.5%.  Sleep latency was 8 minutes and latency to REM sleep was 266 minutes, and awake after sleep onset was 12 minutes.  Sleep stages of the percentage of total sleep time was N1 5%, N2 80% N3 0%, and REM sleep 15% (62 minutes), supine sleep accounted for 344 minutes.  There is a long period of REM sleep around 4:00 am.  Arousal Data:  There were total of 33 arousals  with an arousal index of 5 events per hour.  Of these, 30 were spontaneous.  RESPIRATORY DATA:  There were 2 obstructive apneas, 13 central apneas, 0 mixed apneas, and 6 hypopneas with an apnea-hypopnea index of 3 events per hour.  CPAP was initiated at 5 cm and titrated to a final level of 10 cm at the level of 8 cm for 153 minutes of sleep.  One obstructive apnea, 3 central apneas, and 1 hypopnea were noted for an AHI of 2 events per hour and a lowest desaturation of 89% at a level of 10 cm of 134 minutes of sleep including 15 minutes of REM sleep, 1 central apnea and 1 hypopnea were noted.  The lowest desaturation of 89% and snoring was noted at this level.  OXYGEN DATA:  Oxygen saturation data, the lowest desaturation was 89%. The desaturation index was 80 events per hour.  CARDIAC DATA:  The low heart rate was 54 beats per minute.  The high heart rate was 112 beats per minute.  MOVEMENT-PARASOMNIA:  No significant limb movements were noted.  DISCUSSION:  She was desensitized with a standard [employer]age full-  face mask.  Note that, although CPAP titration was optimal, some snoring persisted at the final level of 10 cm.  IMPRESSIONS: 1. Moderate obstructive sleep apnea with hypopneas causing sleep     fragmentation and oxygen desaturation. 2. This was corrected by 10 cm of CPAP with extended full-face mask. 3. No evidence of cardiac arrhythmias, limb movements, or behavioral     disturbance during sleep.  Recommendations: 1. She should be placed on CPAP of 10 cm of snoring as noted at this     level of CPAP can be increased to about 12 cm. 2. Weight loss would be recommended, she should be asked to avoid     medications with sedative side effects.  She should be cautioned     against driving when sleepy. 3. This titration study was followed by a multiple sleep latency test.     The results of which are documented in a different report.     Oretha Milch,  MD    RVA/MEDQ  D:  03/22/2012 13:32:09  T:  03/23/2012 07:57:48  Job:  [id number]

## 2012-03-24 ENCOUNTER — Encounter: Payer: Self-pay | Admitting: Family Medicine

## 2012-03-30 DIAGNOSIS — G4730 Sleep apnea, unspecified: Secondary | ICD-10-CM

## 2012-03-30 DIAGNOSIS — G4710 Hypersomnia, unspecified: Secondary | ICD-10-CM

## 2012-03-31 NOTE — Procedures (Signed)
NAMESHONTELLE, MUSKA             ACCOUNT NO.:  [id number]  MEDICAL RECORD NO.:  [id number]          PATIENT TYPE:  OUT  LOCATION:  SLEEP CENTER                 FACILITY:  [facility]  PHYSICIAN:  Oretha Milch, MD      DATE OF BIRTH:  02/04/1961  DATE OF STUDY:  03/30/2012                         MULTIPLE SLEEP LATENCY TEST  REFERRING PHYSICIAN:  Oretha Milch, MD  INDICATION FOR STUDY:  Excessive daytime somnolence and fatigue, in spite of CPAP compliance. At the time of this study, she weighed 232 pounds with a height of 5 feet 3 inches, BMI of 41, neck size of 14 inches.  This MSLT was performed with a sleep technologist in attendance.  EEG, EOG, EMG, channels were used.  Sleep stages, arousals were scored according to criteria laid out by the American Academy of Sleep Medicine.  Report : 4 naps were recorded.  Sleep was achieved on all 4 naps with a sleep latency of 5 minutes, 4.5 minutes, 5.5 minutes and 10.5 minutes respectively. REM sleep was not noted in any of these naps. Per MSLT protocol nap #5 was omitted.  Note that, CPAP titration was performed on night before and 10 cm of CPAP was used. Snoring was noted on 10 cm during the MSLT and CPAP was, therefore, increased to 12 cm during the last nap session.   MEAN SLEEP LATENCY:6.4 minutes  NUMBER OF REM EPISODES:0   IMPRESSIONS-  The average sleep latency of 6.4 minutes does correlate with excessive daytime somnolence.  However, absence of sleep onset REMs makes narcolepsy less likely.  The differential diagnosis of excessive daytime somnolence, in spite of CPAP compliance includes residual sleepiness or idiopathic hypersomnolence.  CPAP compliance may have to be objectively conformed by her CPAP download  RECOMMENDATIONS: Recommend if she is compliant on CPAP based on CPAP download, then modafinil can be titrated. Narcolepsy seems less likely.     Oretha Milch, MD    RVA/MEDQ  D:  03/30/2012 17:24:43  T:   03/31/2012 04:51:50  Job:  [id number]

## 2012-04-01 ENCOUNTER — Telehealth: Payer: Self-pay | Admitting: Pulmonary Disease

## 2012-04-01 DIAGNOSIS — G4733 Obstructive sleep apnea (adult) (pediatric): Secondary | ICD-10-CM

## 2012-04-01 NOTE — Telephone Encounter (Signed)
Titration study showed increased CPAP pressure is required 10 cm - I have sent order to DME. Some snoring noted even at this level. MSLT does not show narcolepsy. Her sleepiness is related to her medications CPAP use at least 6h every night is advised

## 2012-04-05 ENCOUNTER — Ambulatory Visit (INDEPENDENT_AMBULATORY_CARE_PROVIDER_SITE_OTHER): Payer: BC Managed Care – PPO | Admitting: Pulmonary Disease

## 2012-04-05 ENCOUNTER — Encounter: Payer: Self-pay | Admitting: Pulmonary Disease

## 2012-04-05 VITALS — BP 126/82 | HR 98 | Temp 98.1°F | Ht 63.5 in | Wt 235.0 lb

## 2012-04-05 DIAGNOSIS — G4710 Hypersomnia, unspecified: Secondary | ICD-10-CM

## 2012-04-05 DIAGNOSIS — G4730 Sleep apnea, unspecified: Secondary | ICD-10-CM

## 2012-04-05 NOTE — Telephone Encounter (Signed)
I called pt and she had bad cell reception. wcb

## 2012-04-05 NOTE — Patient Instructions (Addendum)
Increase CPAP pressure to 12 cm Drop am dose of clonazepam Trial of Nuvigil 150 mg samples (from [place] office)

## 2012-04-05 NOTE — Assessment & Plan Note (Signed)
Fatigue out of proportion to degree of OSA - favor effect of meds, no evidence of narcolepsy on MSLT (no SOREMs) 6/10 PSG > mild sleep disorderde breathing, predominant hypopneas, RDI 16/h, AHI 5/h, lowest destauration 90%, no REM sleep. Fragmented sleep with long periods of awakening & low sleep efficiency   Increase CPAP pressure to 12 cm Drop am dose of clonazepam Trial of Nuvigil 150 mg samples (from [place] office) - discussed side effects, she will measure BP & call us back with report after 1 week of using this

## 2012-04-05 NOTE — Progress Notes (Signed)
  Subjective:    Patient ID: Sabrina [name], female    DOB: [birth year], 51 y.o.   MRN: [id number]  HPI  21 /F for FU of obstructive sleep apnea.  She went through a long separation in 2003 & developed major depression, currently on effexor & abilify regimen. Epworth Sleepiness Score was 22/24 & she describes drowsiness in various social situations.Loud snoring & apneas have been witnessed by friends.Naps are non- refreshing. She has gained 30 lbs in th elast few years. Of note PSG in 10/03 did not show significant snoring or events.  she takes 70 mg of Lisdexamfetamine @ 8A & starts feeling tired again by 2 pm.  10/02/08>>  PSG > mild sleep disorderde breathing, predominant hypopneas, RDI 16/h, AHI 5/h, lowest destauration 90%, no REM sleep. Fragmented sleep with long periods of awakening & low sleep efficiency  April 08, 2009  reviewed compliance 9/24 - 10/12 >> good, avg pr 8.3  pressure ok, mask ok, some fatigue persists.  02/22/2012 3 y FU  last seen 04/2009. wears cpap everynight x 6 hrs a night. needs new supplies and possible pressure change. c/o feeling extremely tired all the time  Fibromyalgia was diagnosed in interim - meds have changed - now off effexor , abilify, amphetamine.  She is on cymbalta, robaxin & roxicodone instead  Jerking movements when off cpap  cpap 8cm on nasal pillows , she has not received new supplies in 3 years !  She is on methocarbamol about once daily & roxicodone prn only  Increased 25 lbs  04/05/2012  Fatigue/ EDS out of proportion to degree of OSA  DD incl undertreated OSA, poor CPAP compliance, residual EDS or other cause of EDS such as fibromyalgia, medications or less likely narcolepsy  Titration study showed increased CPAP pressure is required 10 cm. Some snoring noted even at this level.  MSLT does not show narcolepsy.  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  Review of Systems neg for any significant sore  throat, dysphagia, itching, sneezing, nasal congestion or excess/ purulent secretions, fever, chills, sweats, unintended wt loss, pleuritic or exertional cp, hempoptysis, orthopnea pnd or change in chronic leg swelling. Also denies presyncope, palpitations, heartburn, abdominal pain, nausea, vomiting, diarrhea or change in bowel or urinary habits, dysuria,hematuria, rash, arthralgias, visual complaints, headache, numbness weakness or ataxia.     Objective:   Physical Exam   Gen. Pleasant, obese, in no distress ENT - no lesions, no post nasal drip Neck: No JVD, no thyromegaly, no carotid bruits Lungs: no use of accessory muscles, no dullness to percussion, decreased without rales or rhonchi  Cardiovascular: Rhythm regular, heart sounds  normal, no murmurs or gallops, no peripheral edema Musculoskeletal: No deformities, no cyanosis or clubbing , no tremors        Assessment & Plan:

## 2012-04-13 NOTE — Telephone Encounter (Signed)
lmomtcb x1 for pt 

## 2012-04-14 NOTE — Telephone Encounter (Signed)
I spoke with patient about results and she verbalized understanding and had no questions. I spoke with pt and she stated she still has not heard form [facility]. I called [facility] to see what is going on. I was advised they are trying to get her in tomorrow to get her pressure changed. nothing further was needed

## 2012-04-20 ENCOUNTER — Telehealth: Payer: Self-pay | Admitting: Pulmonary Disease

## 2012-04-20 NOTE — Telephone Encounter (Signed)
i spoke with pt and she stated it is helping. She can tell a difference since using the nuvigil 150 mg. [facility]. Also she has not had her CPAP increase to 10 yet. She is going to make an appt with her DME to have this done. Please advise regarding RX Dr. Vassie Loll thanks   --she is aware RA is not in until tomorrow

## 2012-04-20 NOTE — Telephone Encounter (Signed)
Ok to send Rx  nuvigil 150 daily x 30 x 3 refills Arrange FU in 3-4 months

## 2012-04-21 MED ORDER — ARMODAFINIL 150 MG PO TABS: 150.0000 mg | ORAL_TABLET | Freq: Every day | ORAL | Status: DC

## 2012-04-21 NOTE — Telephone Encounter (Signed)
Rx has been called in per RA.   I have left a message with the pt letting her know that the medication has been called in and to call us back so we can get her scheduled to see Dr. Vassie Loll in 3-4 months.

## 2012-04-26 ENCOUNTER — Telehealth: Payer: Self-pay | Admitting: Pulmonary Disease

## 2012-04-26 NOTE — Telephone Encounter (Signed)
Called [facility] at [phone]. Member # [id number].  Nuvigil APPROVED from 04/26/12 through 04/26/13. Pt and pharmacy notified.

## 2012-04-29 ENCOUNTER — Telehealth: Payer: Self-pay | Admitting: Pulmonary Disease

## 2012-04-29 NOTE — Telephone Encounter (Signed)
Spoke to pt and she was inquiring about what was going on with getting Nuvigil filled. She states that her pharmacy is still telling her that it hasn't been approved. Per the phone note from 04/26/12 it has been approved. I advised the pt that I would call her pharmacy and make sure that they knew it was approved so that they could get it ready for her.

## 2012-05-19 ENCOUNTER — Telehealth: Payer: Self-pay | Admitting: Pulmonary Disease

## 2012-05-19 NOTE — Telephone Encounter (Signed)
lmomtcb x1 for pt 

## 2012-05-19 NOTE — Telephone Encounter (Signed)
Pt returned call. Sabrina [name]  

## 2012-05-19 NOTE — Telephone Encounter (Signed)
I spoke with pt. She states she was doing well on nuvigil 150 mg in the beginning. Now she is back to feeling tired again like she was prior to starting the nuvigil. She believes the dose needs to be increased. Please advise Dr. Vassie Loll thanks

## 2012-05-19 NOTE — Telephone Encounter (Signed)
OK to take additional  1/2 tab  at 2 pm - Pl arrange OV with TP if she has not been seen in 6 mnths

## 2012-05-19 NOTE — Telephone Encounter (Signed)
lmomtcb x1 

## 2012-05-19 NOTE — Telephone Encounter (Signed)
Pt aware of RA recs. She was seen in December. Nothing further was needed

## 2012-05-19 NOTE — Telephone Encounter (Signed)
Pt returned triage's call.  Holly D Pryor ° °

## 2012-06-13 ENCOUNTER — Ambulatory Visit (INDEPENDENT_AMBULATORY_CARE_PROVIDER_SITE_OTHER): Payer: BC Managed Care – PPO | Admitting: Family Medicine

## 2012-06-13 ENCOUNTER — Encounter: Payer: Self-pay | Admitting: Family Medicine

## 2012-06-13 ENCOUNTER — Ambulatory Visit: Payer: BC Managed Care – PPO | Admitting: Family Medicine

## 2012-06-13 DIAGNOSIS — H04209 Unspecified epiphora, unspecified lacrimal gland: Secondary | ICD-10-CM

## 2012-06-13 DIAGNOSIS — M797 Fibromyalgia: Secondary | ICD-10-CM

## 2012-06-13 DIAGNOSIS — IMO0001 Reserved for inherently not codable concepts without codable children: Secondary | ICD-10-CM

## 2012-06-13 MED ORDER — OXYCODONE HCL 5 MG PO TABS: ORAL_TABLET | ORAL | Status: DC

## 2012-06-13 MED ORDER — POLYMYXIN B-TRIMETHOPRIM 10000-0.1 UNIT/ML-% OP SOLN: OPHTHALMIC | Status: DC

## 2012-06-13 NOTE — Progress Notes (Signed)
OFFICE NOTE  06/16/2012  CC:  Chief Complaint  Patient presents with  . Eye Problem    left eye, on and off x 3 weeks, red-draining-not like pink eye     HPI: Patient is a 52 y.o. Caucasian female who is here for eye problem.   Onset of mild pain, itching, and watering in left eye about 3 wks ago.  By late evening it feels like it has grit in it, some greyish mucous in corners or on lashes.  Light hurts it.  Feels like a film is over it.   No contact lenses.   She says her left eye usually does react with watering more when she gets allergies.   Hx of balloon popping in her left eye around age 39.  Left eye more sensitive since then.  Also has hx of nasal septum/sinus surgery. Clear eyes OTC and visein have not helped. Vision a little blurred in that eye even when she blinks to get the film off surface. "Feels like pink eye but eye isn't red or pink".  Pertinent PMH:  Past Medical History  Diagnosis Date  . GERD (gastroesophageal reflux disease)   . Hypertension   . Allergy     rhinitis  . History of cervical cancer     Dr. Rosalio Macadamia (carcinoma in situ): Laser and cone bx 1993, margins neg, paps wnl since.  . Hyperlipidemia     NMR 03/2009.Marland KitchenLDL 161(2735/1887)HDL 37,TG 271  . Subclinical hyperthyroidism 06/19/11    TSH .29 at GYN (T4 and T3 wnl).  Repeat 07/21/11 wnl  . Sleep apnea     Dr. Vassie Loll: CPAP 10 cm H2O with full facemask as per titration study 03/21/12  . Chronic fatigue     +excessive daytime somnolence  . Fibromyalgia syndrome   . Dysfunctional uterine bleeding     Metromenorrhagia+dysmenorrhea  . Obesity   . History of wrist fracture     left  . Disturbance of smell and taste 2013    ENT eval (Dr. Hassel Neth) 10/2011 --recommended flonase, afrin, mucinex, saline nasal rinse and then do intracranial imaging if none of that helped in 2 mo.  [name] METABOLIC SYNDROME X 11/26/2008    Qualifier: Diagnosis of  By: Alwyn Ren MD, Chrissie Noa   Elevated trigs, HTN, elevated  waist circumference.   . Migraine syndrome     topiramate has helped immensely  . Abdominal pain 03/11/2012    MEDS:  Outpatient Prescriptions Prior to Visit  Medication Sig Dispense Refill  . albuterol (VENTOLIN HFA) 108 (90 BASE) MCG/ACT inhaler Inhale 2 puffs into the lungs every 4 (four) hours as needed for wheezing or shortness of breath.  1 Inhaler  1  . Armodafinil 150 MG tablet Take 1 tablet (150 mg total) by mouth daily.  30 tablet  3  . celecoxib (CELEBREX) 200 MG capsule Take 1 capsule (200 mg total) by mouth daily.  30 capsule  6  . Cholecalciferol (VITAMIN D3) 5000 UNITS TABS Take by mouth.      . clonazePAM (KLONOPIN) 1 MG tablet Take 1 tablet (1 mg total) by mouth 2 (two) times daily as needed.  60 tablet  5  . DULoxetine (CYMBALTA) 30 MG capsule 1 every morning and 2 in the PM      . fluticasone (FLONASE) 50 MCG/ACT nasal spray       . losartan-hydrochlorothiazide (HYZAAR) 100-25 MG per tablet Take 1 tablet by mouth daily.  30 tablet  6  . methocarbamol (ROBAXIN) 500 MG  tablet 1/2 tablet during the day and 1 tablet at night  45 tablet  5  . Multiple Vitamins-Minerals (ALIVE ONCE DAILY WOMENS 50+ PO) Take by mouth daily.      [name] omeprazole (PRILOSEC) 20 MG capsule TAKE ONE CAPSULE BY MOUTH DAILY  90 capsule  3  . topiramate (TOPIRAGEN) 25 MG tablet Take 25 mg by mouth daily.        [name] oxyCODONE (OXY IR/ROXICODONE) 5 MG immediate release tablet 1-2 tabs po q6h prn pain  30 tablet  0  . levofloxacin (LEVAQUIN) 500 MG tablet Take 1 tablet (500 mg total) by mouth daily.  10 tablet  0  . phenazopyridine (PYRIDIUM) 200 MG tablet Take 1 tablet (200 mg total) by mouth 3 (three) times daily as needed for pain.  10 tablet  1   No facility-administered medications prior to visit.    PE: Blood pressure 130/81, pulse 109, temperature 99.8 F (37.7 C), temperature source Temporal, height 5\' 4"  (1.626 m), weight 225 lb (102.059 kg), last menstrual period 06/12/2011, SpO2 98.00%. Gen:  Alert, well appearing.  Patient is oriented to person, place, time, and situation. EYES: no swelling, no significant injection of conjunctiva (very mild bulbar conj injds on both sides--normal per pt).  She has a tiny amount of milky exudate collecting in lateral canthus region at base of lashes.  She does have more clear fluid in left eye than right.  EOMI, PERRLA. Visual acuity: OD 20/25, OS 20/25, OU 20/20  IMPRESSION AND PLAN:  Watery eyes Left eye only, without signs of infection currently. With her history of sinus/nasal septum/nasal polyp surgery I have suspicion of left nasolacrimal duct dysfunction/obstruction and this is leading to her left eye sx's.  I recommended she do frequent eye moisturizer drops OTC, start polytrim drops for 7-10d b/c she is at higher risk for bacterial conjunctivitis and her sx's seem to be a bit worse lately.  I recommended she contact her eye MD for appointment to further evaluate for the possibility of nasolacrimal duct obstruction.  Obesity, Class II, BMI 35-39.9, with comorbidity Local bariatric clinic would not accept her insurance so I'll refer her to [facility] bariatric surgery program.  Fibromyalgia syndrome Seems stable, RF'd oxycodone today (most recent RF of this was 02/29/12 for #30), agreed to fill out FMLA papers for this problem when she drops them off.  Continue celebrex 200mg  qd, cymbalta, clonazepam, and robaxin.   An After Visit Summary was printed and given to the patient.  FOLLOW UP: 4-6 mo

## 2012-06-16 DIAGNOSIS — H04203 Unspecified epiphora, bilateral lacrimal glands: Secondary | ICD-10-CM | POA: Insufficient documentation

## 2012-06-16 NOTE — Assessment & Plan Note (Signed)
Local bariatric clinic would not accept her insurance so I'll refer her to [facility] bariatric surgery program.

## 2012-06-16 NOTE — Assessment & Plan Note (Addendum)
Left eye only, without signs of infection currently. With her history of sinus/nasal septum/nasal polyp surgery I have suspicion of left nasolacrimal duct dysfunction/obstruction and this is leading to her left eye sx's.  I recommended she do frequent eye moisturizer drops OTC, start polytrim drops for 7-10d b/c she is at higher risk for bacterial conjunctivitis and her sx's seem to be a bit worse lately.  I recommended she contact her eye MD for appointment to further evaluate for the possibility of nasolacrimal duct obstruction.

## 2012-06-16 NOTE — Assessment & Plan Note (Addendum)
Seems stable, RF'd oxycodone today (most recent RF of this was 02/29/12 for #30), agreed to fill out FMLA papers for this problem when she drops them off.  Continue celebrex 200mg  qd, cymbalta, clonazepam, and robaxin.

## 2012-07-01 ENCOUNTER — Other Ambulatory Visit: Payer: Self-pay | Admitting: *Deleted

## 2012-07-01 MED ORDER — CELECOXIB 200 MG PO CAPS: 200.0000 mg | ORAL_CAPSULE | Freq: Every day | ORAL | Status: DC

## 2012-07-01 NOTE — Telephone Encounter (Signed)
Faxed refill request received from pharmacy for CELEBREX  Last filled by MD on 09/10/11 #30 X 6 Last seen on 06/13/12 Follow up 3.5.14 Please advise refills.

## 2012-07-06 ENCOUNTER — Ambulatory Visit: Payer: BC Managed Care – PPO | Admitting: Family Medicine

## 2012-07-17 ENCOUNTER — Other Ambulatory Visit: Payer: Self-pay | Admitting: Internal Medicine

## 2012-09-12 ENCOUNTER — Other Ambulatory Visit: Payer: Self-pay | Admitting: Pulmonary Disease

## 2012-09-12 ENCOUNTER — Other Ambulatory Visit: Payer: Self-pay | Admitting: Family Medicine

## 2012-09-13 NOTE — Telephone Encounter (Signed)
RX has been called in

## 2012-09-13 NOTE — Telephone Encounter (Signed)
[facility]  Last filled by MD on 11.05.13, #60 x 5  Last seen on 02.10.14   Follow up 4-6 mths  Please advise refills/SLS

## 2012-09-13 NOTE — Telephone Encounter (Signed)
Last refilled 04/21/12. #30 x 3 refills. Please advise if okay to refill thanks

## 2012-09-13 NOTE — Telephone Encounter (Signed)
Ok to refill 

## 2012-09-14 NOTE — Telephone Encounter (Signed)
Rx request faxed to pharmacy/SLS  

## 2012-09-27 ENCOUNTER — Other Ambulatory Visit: Payer: Self-pay | Admitting: Family Medicine

## 2012-09-27 ENCOUNTER — Other Ambulatory Visit: Payer: Self-pay | Admitting: Pulmonary Disease

## 2012-09-27 NOTE — Telephone Encounter (Signed)
DENIED; Rx faxed to pharmacy 05.14.14 #[id number], [facility], Rx received/SLS

## 2012-10-20 ENCOUNTER — Telehealth: Payer: Self-pay | Admitting: *Deleted

## 2012-10-20 NOTE — Telephone Encounter (Signed)
Fax received from pharmacy for PA on Omeprazole 20 mg DR caps Called [facility] at [phone] to obtain PA; PA approval obtained from 05.29.14 to 06.19.15, [facility] at [phone] to verify that PA received via their system/SLS [facility] with contact name and number RE: PA obtained for Omeprazole & dates for approval/SLS

## 2012-10-21 ENCOUNTER — Other Ambulatory Visit: Payer: Self-pay | Admitting: Family Medicine

## 2012-10-21 ENCOUNTER — Telehealth: Payer: Self-pay | Admitting: Family Medicine

## 2012-10-21 MED ORDER — OXYCODONE HCL 5 MG PO TABS: ORAL_TABLET | ORAL | Status: DC

## 2012-10-21 NOTE — Telephone Encounter (Signed)
LMOM for pt to pick up rx at front desk.

## 2012-10-21 NOTE — Telephone Encounter (Signed)
Ok to refill 30 Tabs. Please print & have me sign. phone pt to tell her she may pick up prescription and needs appt. Within 2 mos for 6 mo. F/u.

## 2012-10-21 NOTE — Telephone Encounter (Signed)
Looking back through last office, Dr Milinda Cave said to return in 4-6 months so he may want to refill oxycodone and have her follow up within the month.  Please advise refill.

## 2012-10-21 NOTE — Telephone Encounter (Signed)
Oxycodone denied.  Patient hasn't been seen since 06/13/12 and canceled her March appointment, patient also does not have an upcoming appointment.

## 2012-10-21 NOTE — Addendum Note (Signed)
Addended by: Eulah Pont on: 10/21/2012 03:25 PM   Modules accepted: Orders

## 2012-10-21 NOTE — Telephone Encounter (Signed)
Opened in Error.

## 2012-11-07 ENCOUNTER — Ambulatory Visit (INDEPENDENT_AMBULATORY_CARE_PROVIDER_SITE_OTHER): Payer: BC Managed Care – PPO | Admitting: Family Medicine

## 2012-11-07 ENCOUNTER — Encounter: Payer: Self-pay | Admitting: Family Medicine

## 2012-11-07 VITALS — BP 160/90 | HR 109 | Temp 98.3°F | Ht 64.0 in | Wt 220.0 lb

## 2012-11-07 DIAGNOSIS — J3489 Other specified disorders of nose and nasal sinuses: Secondary | ICD-10-CM

## 2012-11-07 DIAGNOSIS — M797 Fibromyalgia: Secondary | ICD-10-CM

## 2012-11-07 DIAGNOSIS — I10 Essential (primary) hypertension: Secondary | ICD-10-CM

## 2012-11-07 DIAGNOSIS — R350 Frequency of micturition: Secondary | ICD-10-CM

## 2012-11-07 DIAGNOSIS — F419 Anxiety disorder, unspecified: Secondary | ICD-10-CM

## 2012-11-07 DIAGNOSIS — R319 Hematuria, unspecified: Secondary | ICD-10-CM

## 2012-11-07 DIAGNOSIS — F411 Generalized anxiety disorder: Secondary | ICD-10-CM

## 2012-11-07 DIAGNOSIS — IMO0001 Reserved for inherently not codable concepts without codable children: Secondary | ICD-10-CM

## 2012-11-07 DIAGNOSIS — L989 Disorder of the skin and subcutaneous tissue, unspecified: Secondary | ICD-10-CM

## 2012-11-07 LAB — POCT URINALYSIS DIPSTICK
Bilirubin, UA: NEGATIVE
Glucose, UA: NEGATIVE
Ketones, UA: NEGATIVE
Leukocytes, UA: NEGATIVE
Nitrite, UA: NEGATIVE

## 2012-11-07 MED ORDER — SULFAMETHOXAZOLE-TMP DS 800-160 MG PO TABS: 1.0000 | ORAL_TABLET | Freq: Two times a day (BID) | ORAL | Status: DC

## 2012-11-07 NOTE — Progress Notes (Addendum)
OFFICE NOTE  12/13/2012  CC:  Chief Complaint  Patient presents with  . Follow-up    4 month  . Back Pain  . Urinary Frequency     HPI: Patient is a 52 y.o. Caucasian female who is here for 5 mo f/u fibromyalgia+chronic fatigue, HTN, and obesity. Also having more anxiety lately as well as some possible increase in urinary frequency along with a bit of increase in her LBP when she has a full bladder--which she says are commonly the signs that she has a UTI. No dysuria, no hematuria, no incontinence.  Her divorce is becoming final, says discord in this process has her "pushed, to say the least"--custody battle.  Depressed mood as well.  She says she is stress eating.  She is withdrawing, having crying spells. She sees a psychiatrist for med management plus she plans on restarting with a licensed counselor.   She did not take a clonazepam this morning--says she usually doesn't b/c of oversedation in daytime.  She took her bp med 1.5 hours ago.  No home monitoring of bp.  She has not had her initial visit with a bariatric surgeon b/c of all this, plus she had started to lose some weight on her own before all this came up.  Has a painful bump inside right nostril for 98mo or so: the bump scabs over after bleeding on and off slightly.  ROS: nausea more lately, without vomiting.  Pertinent PMH:  Past Medical History  Diagnosis Date  . GERD (gastroesophageal reflux disease)   . Hypertension   . Allergy     rhinitis  . History of cervical cancer     Dr. Rosalio Macadamia (carcinoma in situ): Laser and cone bx 1993, margins neg, paps wnl since.  . Hyperlipidemia     NMR 03/2009.Marland KitchenLDL 161(2735/1887)HDL 37,TG 271  . Subclinical hyperthyroidism 06/19/11    TSH .29 at GYN (T4 and T3 wnl).  Repeat 07/21/11 wnl  . Sleep apnea     Dr. Vassie Loll: CPAP 10 cm H2O with full facemask as per titration study 03/21/12  . Chronic fatigue     +excessive daytime somnolence  . Fibromyalgia syndrome   . Dysfunctional  uterine bleeding     Metromenorrhagia+dysmenorrhea  . Obesity   . History of wrist fracture     left  . Disturbance of smell and taste 2013    ENT eval (Dr. Hassel Neth) 10/2011 --recommended flonase, afrin, mucinex, saline nasal rinse and then do intracranial imaging if none of that helped in 2 mo.  [name] METABOLIC SYNDROME X 11/26/2008    Qualifier: Diagnosis of  By: Alwyn Ren MD, Chrissie Noa   Elevated trigs, HTN, elevated waist circumference.   . Migraine syndrome     topiramate has helped immensely  . Abdominal pain 03/11/2012    MEDS:  Outpatient Prescriptions Prior to Visit  Medication Sig Dispense Refill  . albuterol (VENTOLIN HFA) 108 (90 BASE) MCG/ACT inhaler Inhale 2 puffs into the lungs every 4 (four) hours as needed for wheezing or shortness of breath.  1 Inhaler  1  . celecoxib (CELEBREX) 200 MG capsule Take 1 capsule (200 mg total) by mouth daily.  30 capsule  6  . Cholecalciferol (VITAMIN D3) 5000 UNITS TABS Take by mouth.      . clonazePAM (KLONOPIN) 1 MG tablet TAKE 1 TABLET BY MOUTH TWICE A DAY  60 tablet  4  . DULoxetine (CYMBALTA) 30 MG capsule 1 every morning and 2 in the PM      .  fluticasone (FLONASE) 50 MCG/ACT nasal spray       . losartan-hydrochlorothiazide (HYZAAR) 100-25 MG per tablet Take 1 tablet by mouth daily.  30 tablet  6  . methocarbamol (ROBAXIN) 500 MG tablet 1/2 tablet during the day and 1 tablet at night  45 tablet  5  . Multiple Vitamins-Minerals (ALIVE ONCE DAILY WOMENS 50+ PO) Take by mouth daily.      [name] NUVIGIL 150 MG tablet TAKE 1 TAB DAILY  30 tablet  3  . omeprazole (PRILOSEC) 20 MG capsule TAKE ONE CAPSULE BY MOUTH DAILY  90 capsule  3  . oxyCODONE (OXY IR/ROXICODONE) 5 MG immediate release tablet 1-2 tabs po q6h prn pain  30 tablet  0  . topiramate (TOPIRAGEN) 25 MG tablet Take 25 mg by mouth daily.        [name] trimethoprim-polymyxin b (POLYTRIM) ophthalmic solution 2 drops in left eye qid x 10d  10 mL  0   No facility-administered medications  prior to visit.    PE: Blood pressure 160/90, pulse 109, temperature 98.3 F (36.8 C), temperature source Oral, height 5\' 4"  (1.626 m), weight 220 lb (99.791 kg), last menstrual period 06/12/2011, SpO2 98.00%. Gen: Alert, well appearing.  Patient is oriented to person, place, time, and situation. ENT:   Eyes: no injection, icteris, swelling, or exudate.  EOMI, PERRLA. Nose: no drainage or turbinate edema/swelling.  No injection or focal lesion.  Mouth: lips without lesion/swelling.  Oral mucosa pink and moist.  Dentition intact and without obvious caries or gingival swelling.  Oropharynx without erythema, exudate, or swelling.  Neck - No masses or thyromegaly or limitation in range of motion CV: RRR, no m/r/g.   LUNGS: CTA bilat, nonlabored resps, good aeration in all lung fields. SKIN: right nostril in superolateral region of the proximal nasal passage there is a dome shaped papule with central umbillication/ulceration, without bleeding or drainage.  No erythema or this lesion.  Mildly tender to touch.  LAB:  CC UA today showed trace intact blood, otherwise normal.  IMPRESSION AND PLAN:  HYPERTENSION, ESSENTIAL NOS Need home monitoring data to compare to in-office measurements. No change in meds made today.  Fibromyalgia syndrome Stable. No changes in med regimen.  Urine frequency With microhematuria--start empiric treatment for UTI with bactrim DS 1 bid x 3-5 days. Send urine for c/s.  Obesity, Class II, BMI 35-39.9, with comorbidity Bariatric clinic referral has been made but still awaiting first appt with them. Continue to work at increasing exercise and eat prudent diet.  Non-healing skin lesion of nose Question of molluscum lesion. Refer to skin surgery center for further evaluation and management.  Anxiety With depression--situational at this point. Not doing well but understandably so. Continue with current psychiatrist and counselor.   FOLLOW UP: 4-6 mo for CPE  w/fasting labs

## 2012-11-07 NOTE — Patient Instructions (Signed)
If top bp number is >160 for 3 consecutive days or bottom bp number >100 for 3 days----call office to notify.

## 2012-11-09 LAB — URINE CULTURE

## 2012-11-11 NOTE — Progress Notes (Signed)
Noted  

## 2012-12-13 DIAGNOSIS — R350 Frequency of micturition: Secondary | ICD-10-CM | POA: Insufficient documentation

## 2012-12-13 DIAGNOSIS — L989 Disorder of the skin and subcutaneous tissue, unspecified: Secondary | ICD-10-CM | POA: Insufficient documentation

## 2012-12-13 NOTE — Assessment & Plan Note (Signed)
Need home monitoring data to compare to in-office measurements. No change in meds made today.

## 2012-12-13 NOTE — Assessment & Plan Note (Signed)
Question of molluscum lesion. Refer to skin surgery center for further evaluation and management.

## 2012-12-13 NOTE — Assessment & Plan Note (Addendum)
With depression--situational at this point. Not doing well but understandably so. Continue with current psychiatrist and counselor.

## 2012-12-13 NOTE — Assessment & Plan Note (Signed)
With microhematuria--start empiric treatment for UTI with bactrim DS 1 bid x 3-5 days. Send urine for c/s.

## 2012-12-13 NOTE — Assessment & Plan Note (Addendum)
Bariatric clinic referral has been made but still awaiting first appt with them. Continue to work at increasing exercise and eat prudent diet.

## 2012-12-13 NOTE — Addendum Note (Signed)
Addended by: Jeoffrey Massed on: 12/13/2012 09:28 AM   Modules accepted: Level of Service

## 2012-12-13 NOTE — Assessment & Plan Note (Signed)
Stable. No changes in med regimen.

## 2012-12-29 ENCOUNTER — Telehealth: Payer: Self-pay | Admitting: Family Medicine

## 2012-12-29 NOTE — Telephone Encounter (Signed)
Patient requesting robaxin refill.  Patient last seen 11/07/12.  Next OV is 03/29/13.  Medication last filled 03/08/12 x 5 refills.  Please advise.

## 2012-12-30 MED ORDER — METHOCARBAMOL 500 MG PO TABS: ORAL_TABLET | ORAL | Status: DC

## 2012-12-30 NOTE — Telephone Encounter (Signed)
Sent medication to pharmacy.   

## 2012-12-30 NOTE — Telephone Encounter (Signed)
OK to RF as previously prescribed, 5 additional RFs.-thx

## 2013-01-23 ENCOUNTER — Other Ambulatory Visit: Payer: Self-pay | Admitting: Family Medicine

## 2013-01-23 MED ORDER — LOSARTAN POTASSIUM-HCTZ 100-25 MG PO TABS: 1.0000 | ORAL_TABLET | Freq: Every day | ORAL | Status: DC

## 2013-01-25 ENCOUNTER — Other Ambulatory Visit: Payer: Self-pay | Admitting: Pulmonary Disease

## 2013-01-26 ENCOUNTER — Other Ambulatory Visit: Payer: Self-pay | Admitting: Family Medicine

## 2013-01-26 NOTE — Telephone Encounter (Signed)
Request from pharmacy for robaxin.  Called pharmacy because we already sent in that medication on 12/30/12 x 5 refills.   [facility] and was unsure why request was sent.

## 2013-01-31 ENCOUNTER — Telehealth: Payer: Self-pay | Admitting: Pulmonary Disease

## 2013-01-31 ENCOUNTER — Other Ambulatory Visit: Payer: Self-pay | Admitting: Pulmonary Disease

## 2013-01-31 ENCOUNTER — Other Ambulatory Visit: Payer: Self-pay | Admitting: Family Medicine

## 2013-01-31 MED ORDER — LOSARTAN POTASSIUM-HCTZ 100-25 MG PO TABS: 1.0000 | ORAL_TABLET | Freq: Every day | ORAL | Status: DC

## 2013-01-31 MED ORDER — ARMODAFINIL 150 MG PO TABS: ORAL_TABLET | ORAL | Status: DC

## 2013-01-31 NOTE — Telephone Encounter (Signed)
[facility] Pt is aware. Nothing further needed 

## 2013-01-31 NOTE — Telephone Encounter (Signed)
Ok to refill 

## 2013-01-31 NOTE — Telephone Encounter (Signed)
I spoke with pt. I advised her she was overdue for an appt with RA. She is scheduled to come into [facility] office as she works in [employer] and would be easier for her. She is scheduled for 10/14 at 3:15. Please advise if okay to refill nuvigil. 150 mg. Thanks Last refilled 09/12/12 #30 x 3 refills

## 2013-02-14 ENCOUNTER — Ambulatory Visit: Payer: BC Managed Care – PPO | Admitting: Pulmonary Disease

## 2013-03-06 ENCOUNTER — Telehealth: Payer: Self-pay | Admitting: Family Medicine

## 2013-03-06 MED ORDER — CELECOXIB 200 MG PO CAPS: 200.0000 mg | ORAL_CAPSULE | Freq: Every day | ORAL | Status: DC

## 2013-03-06 MED ORDER — CLONAZEPAM 1 MG PO TABS: ORAL_TABLET | ORAL | Status: DC

## 2013-03-06 NOTE — Telephone Encounter (Signed)
Patient request celebrex and clonazepam refills.  Patient last seen 11/07/12.  Last rx for celebrex was 07/01/12 x 6 refills.  Last rx for clonazepam was 09/12/12 x 4 refills.  Please advise.

## 2013-03-06 NOTE — Telephone Encounter (Signed)
Celebrex eRx'd, clonazepam printed.

## 2013-03-07 ENCOUNTER — Encounter: Payer: Self-pay | Admitting: Pulmonary Disease

## 2013-03-07 ENCOUNTER — Ambulatory Visit (INDEPENDENT_AMBULATORY_CARE_PROVIDER_SITE_OTHER): Payer: BC Managed Care – PPO | Admitting: Pulmonary Disease

## 2013-03-07 VITALS — BP 126/82 | HR 68 | Temp 97.8°F | Ht 63.5 in | Wt 209.0 lb

## 2013-03-07 DIAGNOSIS — G4710 Hypersomnia, unspecified: Secondary | ICD-10-CM

## 2013-03-07 NOTE — Progress Notes (Signed)
  Subjective:    Patient ID: Sabrina [name], female    DOB: 06/07/1960, 52 y.o.   MRN: [id number]  HPI  23 /F for FU of obstructive sleep apnea.  Fatigue/ EDS out of proportion to degree of OSA Attributed to  Fibromyalgia &  medications   She went through a long separation in 2003 & developed major depression, was on effexor & abilify regimen. Of note PSG in 10/03 did not show significant snoring or events.  She was on 70 mg of Lisdexamfetamine @ 8A & starts feeling tired again by 2 pm.  Fibromyalgia was diagnosed in 2013 - meds were changed - now off effexor , abilify, amphetamine.  She is on cymbalta &  methocarbamol instead  10/02/08>> PSG > mild sleep disordered breathing, predominant hypopneas, RDI 16/h, AHI 5/h, lowest destauration 90%, no REM sleep. Fragmented sleep with long periods of awakening & low sleep efficiency  MSLT 2013 did not show narcolepsy.  Download 9/24 - 02/12/09>> good, avg pr 8.3      03/07/2013  Chief Complaint  Patient presents with  . Sleep Apnea    Pt is using CPAP every night for about 6 hours a night. Pt is having issues with [employer] and getting supplies.  Pt is taking nuvigil  every day and clonazepam at night.  Pt states she is still having alot of fatigue inthe day, but she can tell a difference if she misses a dose.     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 >>CPAP was increased to 12 cm, Nuvigil started in dec 2013 She can tell a difference since using the nuvigil 150 mg she was doing well on nuvigil 150 mg in the beginning. Now she is back to feeling tired again like she was prior to starting the nuvigil. -OK to take additional 1/2 tab at 2 pm  [employer] - billing issues Lost from 235 to 209 lbs pressure ok, mask ok, some fatigue persists.     Review of Systems neg for any significant sore throat, dysphagia, itching, sneezing, nasal congestion or excess/ purulent secretions, fever, chills, sweats, unintended wt  loss, pleuritic or exertional cp, hempoptysis, orthopnea pnd or change in chronic leg swelling. Also denies presyncope, palpitations, heartburn, abdominal pain, nausea, vomiting, diarrhea or change in bowel or urinary habits, dysuria,hematuria, rash, arthralgias, visual complaints, headache, numbness weakness or ataxia.     Objective:   Physical Exam  Gen. Pleasant, obese, in no distress ENT - no lesions, no post nasal drip Neck: No JVD, no thyromegaly, no carotid bruits Lungs: no use of accessory muscles, no dullness to percussion, decreased without rales or rhonchi  Cardiovascular: Rhythm regular, heart sounds  normal, no murmurs or gallops, no peripheral edema Musculoskeletal: No deformities, no cyanosis or clubbing , no tremors        Assessment & Plan:

## 2013-03-07 NOTE — Patient Instructions (Signed)
We discussed side effects of nuvigil & other medications CPAP is set at 12 cm

## 2013-03-08 NOTE — Assessment & Plan Note (Signed)
Fatigue out of proportion to degree of OSA - this is adequately treated on 12 cm - likely attributed to fibromyalgia & meds We discussed side effects of nuvigil & other medications CPAP is set at 12 cm  Weight loss encouraged, compliance with goal of at least 4-6 hrs every night is the expectation. Advised against medications with sedative side effects Cautioned against driving when sleepy - understanding that sleepiness will vary on a day to day basis

## 2013-03-29 ENCOUNTER — Ambulatory Visit (INDEPENDENT_AMBULATORY_CARE_PROVIDER_SITE_OTHER): Payer: BC Managed Care – PPO | Admitting: Family Medicine

## 2013-03-29 ENCOUNTER — Encounter: Payer: Self-pay | Admitting: Family Medicine

## 2013-03-29 ENCOUNTER — Other Ambulatory Visit: Payer: Self-pay | Admitting: Family Medicine

## 2013-03-29 VITALS — BP 117/81 | HR 82 | Temp 98.8°F | Ht 64.0 in | Wt 207.8 lb

## 2013-03-29 DIAGNOSIS — Z0000 Encounter for general adult medical examination without abnormal findings: Secondary | ICD-10-CM

## 2013-03-29 DIAGNOSIS — M7750 Other enthesopathy of unspecified foot: Secondary | ICD-10-CM

## 2013-03-29 DIAGNOSIS — M7740 Metatarsalgia, unspecified foot: Secondary | ICD-10-CM

## 2013-03-29 LAB — COMPREHENSIVE METABOLIC PANEL
ALT: 12 U/L (ref 0–35)
AST: 18 U/L (ref 0–37)
Albumin: 4.5 g/dL (ref 3.5–5.2)
BUN: 23 mg/dL (ref 6–23)
Calcium: 9.7 mg/dL (ref 8.4–10.5)
Chloride: 102 mEq/L (ref 96–112)
Potassium: 4.6 mEq/L (ref 3.5–5.3)
Total Protein: 7.1 g/dL (ref 6.0–8.3)

## 2013-03-29 LAB — CBC WITH DIFFERENTIAL/PLATELET
Basophils Absolute: 0 10*3/uL (ref 0.0–0.1)
Basophils Relative: 1 % (ref 0–1)
Eosinophils Absolute: 0.1 10*3/uL (ref 0.0–0.7)
Eosinophils Relative: 2 % (ref 0–5)
Hemoglobin: 13.4 g/dL (ref 12.0–15.0)
Lymphs Abs: 1.8 10*3/uL (ref 0.7–4.0)
MCH: 27.5 pg (ref 26.0–34.0)
MCHC: 34 g/dL (ref 30.0–36.0)
MCV: 80.9 fL (ref 78.0–100.0)
Monocytes Relative: 9 % (ref 3–12)
Neutrophils Relative %: 54 % (ref 43–77)
RBC: 4.87 MIL/uL (ref 3.87–5.11)
RDW: 14.3 % (ref 11.5–15.5)

## 2013-03-29 LAB — LIPID PANEL: HDL: 41 mg/dL (ref >39)

## 2013-03-29 MED ORDER — OXYCODONE HCL 10 MG PO TABS: ORAL_TABLET | ORAL | Status: DC

## 2013-03-29 NOTE — Progress Notes (Signed)
Pre-visit discussion using our clinic review tool. No additional management support is needed unless otherwise documented below in the visit note.       Office Note 03/31/2013  CC:  Chief Complaint  Patient presents with  . Annual Exam    HPI:  Sabrina [name] is a 52 y.o. White female who is here for complete CPE. She declined flu vaccine today.  Acute complaint: 2-3 month history of feet pain (hx of surgery on both feet approx 6 yrs ago for plantar fasciitis--says the pain never resolved but it got better).  Now the pain is in metatarsal head region bilat, "burning, tingling, numbing, horrible pain".  Bengay, topricin, biofreeze, foot massager, soaking feet in epsom salt have not helped.  Tried metatarsal pads and no help.  Pain present with non wt bearing but worse with ambulation.  Not doing any inordinate amount of exercise and recalls no trauma.  Home bp measurements have been normal.  She has GYN MD: gets annual cervical cancer and breast cancer screeing through him.    Past Medical History  Diagnosis Date  . GERD (gastroesophageal reflux disease)   . Hypertension   . Allergy     rhinitis  . History of cervical cancer     Dr. Rosalio Macadamia (carcinoma in situ): Laser and cone bx 1993, margins neg, paps wnl since.  . Hyperlipidemia     NMR 03/2009.Marland KitchenLDL 161(2735/1887)HDL 37,TG 271  . Subclinical hyperthyroidism 06/19/11    TSH .29 at GYN (T4 and T3 wnl).  Repeat 07/21/11 wnl  . Sleep apnea     Dr. Vassie Loll: CPAP 10 cm H2O with full facemask as per titration study 03/21/12  . Chronic fatigue     +excessive daytime somnolence  . Fibromyalgia syndrome   . Dysfunctional uterine bleeding     Metromenorrhagia+dysmenorrhea  . Obesity   . History of wrist fracture     left  . Disturbance of smell and taste 2013    ENT eval (Dr. Hassel Neth) 10/2011 --recommended flonase, afrin, mucinex, saline nasal rinse and then do intracranial imaging if none of that helped in 2 mo.  [name]  METABOLIC SYNDROME X 11/26/2008    Qualifier: Diagnosis of  By: Alwyn Ren MD, Chrissie Noa   Elevated trigs, HTN, elevated waist circumference.   . Migraine syndrome     topiramate has helped immensely  . Abdominal pain 03/11/2012    Past Surgical History  Procedure Laterality Date  . Sinus surgery for septal deviation  1999    & polyps  . Wrist surgery      left  . Knee arthroscopy  04/1988    x2,open procedure for hamstring tendon injury)  . Microdiscectomy lumbar  09/2003    L5/S1 left microdiscectomy (Dr. Noel Gerold)  . Plantar fascia release  02/2009    Bilateral (Dr. Simonne Come)  . Combined hysteroscopy diagnostic / d&c  03/2008    Done for thickened posterior endometrium found on w/u for menorrhagia.  Pathology-benign.  . Anal sphincterotomy  2007    for deep anal fissure (Dr. Luisa Hart)  . Hemorrhoid surgery  2006    Prolapsed internal hemorrhoids (Dr. Luisa Hart)  . 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 Emphysema Mother   . Heart disease Mother 28    MI  . Emphysema Father   . Heart disease Father 53    MI  . Cancer Maternal Grandmother  BREAST  . Heart disease Maternal Grandmother     MI  . Heart disease Maternal Grandfather     MI  . Cancer Paternal Grandmother     STOMACH  . Cancer Paternal Grandfather     ? INTRA ABDOMINAL  . Heart disease Paternal Grandfather     MI    History   Social History  . Marital Status: Divorced    Spouse Name: N/A    Number of Children: N/A  . Years of Education: N/A   Occupational History  . 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 Smoking status: Never Smoker   . Smokeless tobacco: Never Used  . Alcohol Use: No  . Drug Use: No  . Sexual Activity: Not on file   Other Topics Concern  . Not on file   Social History Narrative   Divorced, LIVES WITH 1 SON.   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   2 DOGS    NO DIET, NO REG EXERCISE.  No T/A/Ds.   PREV ON [facility]    Outpatient Prescriptions Prior to Visit  Medication Sig Dispense Refill  . albuterol (VENTOLIN HFA) 108 (90 BASE) MCG/ACT inhaler Inhale 2 puffs into the lungs every 4 (four) hours as needed for wheezing or shortness of breath.  1 Inhaler  1  . celecoxib (CELEBREX) 200 MG capsule Take 1 capsule (200 mg total) by mouth daily.  30 capsule  6  . Cholecalciferol (VITAMIN D3) 5000 UNITS TABS Take by mouth.      . clonazePAM (KLONOPIN) 1 MG tablet TAKE 1 TABLET BY MOUTH TWICE A DAY  60 tablet  5  . DULoxetine (CYMBALTA) 30 MG capsule Take 90 mg by mouth daily.      . fish oil-omega-3 fatty acids 1000 MG capsule Take 2 g by mouth daily.      . fluticasone (FLONASE) 50 MCG/ACT nasal spray Place 2 sprays into the nose daily.       [name] losartan-hydrochlorothiazide (HYZAAR) 100-25 MG per tablet Take 1 tablet by mouth daily.      . methocarbamol (ROBAXIN) 500 MG tablet 1/2 tablet during the day and 1 tablet at night  45 tablet  5  . Multiple Vitamins-Minerals (ALIVE ONCE DAILY WOMENS 50+ PO) Take by mouth daily.      [name] omeprazole (PRILOSEC) 20 MG capsule TAKE ONE CAPSULE BY MOUTH DAILY  90 capsule  3  . topiramate (TOPIRAGEN) 25 MG tablet Take 25 mg by mouth daily.        . Armodafinil (NUVIGIL) 150 MG tablet Take 1 tablet daily  30 tablet  1  . oxyCODONE (OXY IR/ROXICODONE) 5 MG immediate release tablet 1-2 tabs po q6h prn pain  30 tablet  0   No facility-administered medications prior to visit.    Allergies  Allergen Reactions  . Hydrocodone-Acetaminophen     Pt states she has itching after taking large quantities.  OK prn.    ROS Review of Systems  Constitutional: Positive for fatigue (chronic). Negative for fever, chills and appetite change.  HENT: Negative for congestion, dental problem, ear pain and sore throat.   Eyes: Negative for discharge, redness and visual disturbance.  Respiratory: Negative for cough, chest tightness,  shortness of breath and wheezing.   Cardiovascular: Negative for chest pain, palpitations and leg swelling.  Gastrointestinal: Negative for nausea, vomiting, abdominal pain, diarrhea and blood in stool.  Genitourinary: Negative for dysuria, urgency, frequency, hematuria,  flank pain and difficulty urinating.  Musculoskeletal: Positive for arthralgias (chronic) and myalgias (chronic). Negative for back pain, joint swelling and neck stiffness.  Skin: Negative for pallor and rash.  Neurological: Negative for dizziness, speech difficulty, weakness and headaches.  Hematological: Negative for adenopathy. Does not bruise/bleed easily.  Psychiatric/Behavioral: Negative for confusion and sleep disturbance. The patient is not nervous/anxious.      PE; Blood pressure 117/81, pulse 82, temperature 98.8 F (37.1 C), temperature source Temporal, height 5\' 4"  (1.626 m), weight 207 lb 12 oz (94.235 kg), last menstrual period 06/12/2011, SpO2 97.00%. Gen: Alert, well appearing, obese-appearing WF in NAD.  Patient is oriented to person, place, time, and situation. AFFECT: pleasant, lucid thought and speech. ENT: Ears: EACs clear, normal epithelium.  TMs with good light reflex and landmarks bilaterally.  Eyes: no injection, icteris, swelling, or exudate.  EOMI, PERRLA. Nose: no drainage or turbinate edema/swelling.  No injection or focal lesion.  Mouth: lips without lesion/swelling.  Oral mucosa pink and moist.  Dentition intact and without obvious caries or gingival swelling.  Oropharynx without erythema, exudate, or swelling.  Neck: supple/nontender.  No LAD, mass, or TM.  Carotid pulses 2+ bilaterally, without bruits. CV: RRR, no m/r/g.   LUNGS: CTA bilat, nonlabored resps, good aeration in all lung fields. ABD: soft, NT, ND, BS normal.  No hepatospenomegaly or mass.  No bruits. EXT: no clubbing, cyanosis, or edema.  Musculoskeletal: no joint swelling, erythema, warmth, or tenderness.  ROM of all joints  intact. The proximal portion of plantar fascia on both feet are mildly tender to palpation. Metatarsal head region of both feet is diffusely tender to palpation with some mild calluses scattered across this region.  No warts.  I cannot feel any interdigital neuromas. Skin - no sores or suspicious lesions or rashes or color changes   Pertinent labs:  None today  ASSESSMENT AND PLAN:   Health maintenance examination Reviewed age and gender appropriate health maintenance issues (prudent diet, regular exercise, health risks of tobacco and excessive alcohol, use of seatbelts, fire alarms in home, use of sunscreen).  Also reviewed age and gender appropriate health screening as well as vaccine recommendations. She declined flu vaccine today. Fasting health panel to be done today: ordered. Continue all current meds.  Metatarsalgia Refractory to medical treatments. Referral to podiatry ordered today. Oxycodone 10mg , 1-2 tabs po q6h prn, #60, no RF.  Therapeutic expectations and side effect profile of medication discussed today.  Patient's questions answered.    An After Visit Summary was printed and given to the patient.  FOLLOW UP:  Return in about 4 months (around 07/27/2013) for f/u fibromyalgia, HTN.

## 2013-03-30 LAB — TSH: TSH: 0.683 u[IU]/mL (ref 0.350–4.500)

## 2013-03-31 ENCOUNTER — Other Ambulatory Visit: Payer: Self-pay | Admitting: Pulmonary Disease

## 2013-03-31 DIAGNOSIS — Z0000 Encounter for general adult medical examination without abnormal findings: Secondary | ICD-10-CM | POA: Insufficient documentation

## 2013-03-31 DIAGNOSIS — M7740 Metatarsalgia, unspecified foot: Secondary | ICD-10-CM | POA: Insufficient documentation

## 2013-03-31 NOTE — Assessment & Plan Note (Signed)
Refractory to medical treatments. Referral to podiatry ordered today. Oxycodone 10mg , 1-2 tabs po q6h prn, #60, no RF.  Therapeutic expectations and side effect profile of medication discussed today.  Patient's questions answered.

## 2013-03-31 NOTE — Telephone Encounter (Signed)
nuvigil last refilled 01/31/13 #90 x 1 refill Last OV 03/07/13 Please advise if okay to efill Dr. Vassie Loll thanks

## 2013-03-31 NOTE — Telephone Encounter (Signed)
OK to refill

## 2013-03-31 NOTE — Assessment & Plan Note (Signed)
Reviewed age and gender appropriate health maintenance issues (prudent diet, regular exercise, health risks of tobacco and excessive alcohol, use of seatbelts, fire alarms in home, use of sunscreen).  Also reviewed age and gender appropriate health screening as well as vaccine recommendations. She declined flu vaccine today. Fasting health panel to be done today: ordered. Continue all current meds.

## 2013-03-31 NOTE — Telephone Encounter (Signed)
RX has been called in

## 2013-04-14 ENCOUNTER — Ambulatory Visit (INDEPENDENT_AMBULATORY_CARE_PROVIDER_SITE_OTHER): Payer: BC Managed Care – PPO

## 2013-04-14 ENCOUNTER — Telehealth: Payer: Self-pay | Admitting: *Deleted

## 2013-04-14 VITALS — BP 137/79 | HR 96 | Resp 12

## 2013-04-14 DIAGNOSIS — M797 Fibromyalgia: Secondary | ICD-10-CM

## 2013-04-14 DIAGNOSIS — R52 Pain, unspecified: Secondary | ICD-10-CM

## 2013-04-14 DIAGNOSIS — M722 Plantar fascial fibromatosis: Secondary | ICD-10-CM

## 2013-04-14 DIAGNOSIS — IMO0001 Reserved for inherently not codable concepts without codable children: Secondary | ICD-10-CM

## 2013-04-14 DIAGNOSIS — M7750 Other enthesopathy of unspecified foot: Secondary | ICD-10-CM

## 2013-04-14 DIAGNOSIS — G629 Polyneuropathy, unspecified: Secondary | ICD-10-CM

## 2013-04-14 DIAGNOSIS — G609 Hereditary and idiopathic neuropathy, unspecified: Secondary | ICD-10-CM

## 2013-04-14 NOTE — Progress Notes (Signed)
Subjective:    Patient ID: Sabrina [name], female    DOB: 1960-08-13, 52 y.o.   MRN: [id number]  HPI Comments: '' B/L HEEL HAVE SURGERY DONE ABOUT 4 YEARS AGO , BUT STILL HURTING''  TRIED INSERTS, BIO FREEZE, AND ORAL MED. ALSO, THE BALL OF THE FEET HAVING BURNING SENSATION FOR 2 MONTHS AND TRIED TO TRY TO PUT METATARSAL PADS BUT IS NOT HELPING .''  patient continues to have pain and occasionally some burning stinging abnormal sensation in the forefoot some tenderness in the inferior heel and arch area on palpation at this time she had proximal radiation of pain the dorsum of the foot and dorsal lateral aspect of the foot following up your leg and thigh region    Review of Systems  HENT: Positive for sinus pressure.   Endocrine: Positive for heat intolerance.  Musculoskeletal: Positive for back pain and gait problem.  Allergic/Immunologic: Positive for environmental allergies.  Neurological: Positive for dizziness and headaches.  Hematological: Bruises/bleeds easily.  Psychiatric/Behavioral: The patient is nervous/anxious.        Objective:   Physical Exam Vascular status is intact with pedal pulses palpable DP postal for PT plus one over 4 bilateral. Capillary fill time 3-4 seconds all digits skin temperature warm turgor normal no edema rubor pallor or varicosities noted neurologically epicritic and proprioceptive sensations appear to be intact on touch however patient does have significant hyperesthesia on palpation range of motion there is tenderness on compression of the interspaces second third and fourth and somewhat in the first on direct lateral compression possible findings of metatarsalgia versus neuroma symptomology versus fibromyalgia symptomology which can often mimic neuroma. Patient also is diffuse tenderness on palpation of the Magan plantar fascia from mid arch to inferior heel. Patient is currently being treated for fibromyalgia is also on Celebrex and oxycodone Cymbalta  and Klonopin continues to have symptomology and pain or neuralgia type symptoms in the forefoot as well as plantar fascial symptoms.  Patient does have a history of back surgery done by Sabrina [name], patient had a bulging disc or herniated disc that was repaired. The surgery was sore between 5 and 10 years ago. Continues to have some possible lumbar radiculopathy type symptomology with radiation of pain. Should note that the pain in the forefoot or metatarsalgia type pain came on simultaneously to both feet which is not typical in neuroma type symptomology or something secondary to trauma or overuse. Radiographically the feet or rectus digits no fractures sesamoid position 1-2 there is mild inferior calcaneal spurring some mild asymmetric joint space narrowing Lisfranc's for fifth metatarsal base and cuboid region. Patient is also wearing OTC orthoses which is been modified with valgus posting on the entire lateral sides both feet this would force the patient into a promontory change of the foot which may actually exacerbate fascial symptoms in the forefoot metatarsalgia. I suggested she remove the valgus posting which was added to the orthoses and try wearing them in a rectus posterior position for a month or 2.       Assessment & Plan:  Assessment this time patient does have diffuse metatarsalgia and paresthesias of both feet as well as possible residual plantar fascial symptomology. I do feel most of this is neurologic in nature and possibly associated with her fibromyalgia. Her symptoms do not appear to be biomechanical in nature although I did recommend to continue with orthoses in suggested removing the valgus posting. I do feel patient might benefit from neurologic consult and evaluation of  her back and possible consideration for pain management patient did ask about a pain block and I advised her that Sabrina [name] may be able to help her with that option. At this time a referral to Sabrina [name] ulcer is made  for followup evaluation and treatment as indicated or needed for possible lumbar radiculopathy as well as chronic pain syndrome associated likely with fibromyalgia. Patient seemed to understand that there is not much I can do from a biomechanical or local standpoint as her symptoms did not appear to be biomechanical in nature we'll followup with Sabrina [name] is recommended and also continue followup with her primary physicians in rheumatologist managing the fibromyalgia.  Alvan Dame DPM

## 2013-04-14 NOTE — Telephone Encounter (Signed)
Dr Ralene Cork referred pt to [facility] [phone]/fax [phone], Dr Danielle Dess for Consultation and Treatment.

## 2013-04-14 NOTE — Patient Instructions (Signed)

## 2013-05-11 ENCOUNTER — Telehealth: Payer: Self-pay | Admitting: Family Medicine

## 2013-05-11 ENCOUNTER — Telehealth: Payer: Self-pay | Admitting: *Deleted

## 2013-05-11 DIAGNOSIS — R209 Unspecified disturbances of skin sensation: Secondary | ICD-10-CM

## 2013-05-11 DIAGNOSIS — M545 Low back pain, unspecified: Secondary | ICD-10-CM

## 2013-05-11 DIAGNOSIS — M79671 Pain in right foot: Secondary | ICD-10-CM

## 2013-05-11 DIAGNOSIS — IMO0001 Reserved for inherently not codable concepts without codable children: Secondary | ICD-10-CM

## 2013-05-11 DIAGNOSIS — M79672 Pain in left foot: Principal | ICD-10-CM

## 2013-05-11 DIAGNOSIS — G8929 Other chronic pain: Secondary | ICD-10-CM

## 2013-05-11 NOTE — Telephone Encounter (Signed)
Called [phone].Inititated PA for nuvigil 150 mg tablet.  This was approved from 04-20-13 until 05/11/14 Ref # [id number] I called [facility] and made them aware of approval.

## 2013-05-11 NOTE — Telephone Encounter (Addendum)
I'll put the order in. May require prior auth--see Dr. Erasmo Downer note. If denied now, she'll likely have to wait until she sees the neurosurgeon to get this test.

## 2013-05-11 NOTE — Telephone Encounter (Signed)
Please advise 

## 2013-05-11 NOTE — Telephone Encounter (Signed)
Patient states that the foot doctor feels like the pain in her foot is stemming from her back. He has referred her to a neurosurgeon. She is going to Dr. Ellene Route at [facility]. He cannot see her until 06/27/13. It was recommended to the patient to contact her PCP to get an MRI of her back before she comes to Dr. Clarice Pole office. Patient is requesting an order.

## 2013-05-12 ENCOUNTER — Telehealth: Payer: Self-pay | Admitting: *Deleted

## 2013-05-12 NOTE — Telephone Encounter (Signed)
Fax received from [place] states pt has an appt 06/22/2013 at 1215pm with Dr Ellene Route, and the pt has been contacted.

## 2013-05-18 NOTE — Telephone Encounter (Signed)
MRI was denied by [employer]. Advised patient that she will have to pursue referral with neurosurgeon per insurance company. Patient voiced understanding, she stated she was requesting it per the schedulers advise.

## 2013-06-11 ENCOUNTER — Other Ambulatory Visit: Payer: Self-pay | Admitting: Pulmonary Disease

## 2013-06-13 NOTE — Telephone Encounter (Signed)
Last refilled 03/31/13 #30 x 1 refill Last OV 03/07/13 No pending appt.  Please advise refill on nuvigil Dr. Elsworth Soho thanks

## 2013-06-14 ENCOUNTER — Telehealth: Payer: Self-pay | Admitting: Family Medicine

## 2013-06-14 NOTE — Telephone Encounter (Signed)
OK to refill She needs FU q 6 mnths

## 2013-06-14 NOTE — Telephone Encounter (Signed)
Patient called to see if she had any hep immunizations.  I told her that we didn't have a record of it.  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

## 2013-06-15 ENCOUNTER — Encounter: Payer: Self-pay | Admitting: *Deleted

## 2013-06-15 ENCOUNTER — Ambulatory Visit (INDEPENDENT_AMBULATORY_CARE_PROVIDER_SITE_OTHER): Payer: BC Managed Care – PPO | Admitting: Nurse Practitioner

## 2013-06-15 ENCOUNTER — Encounter: Payer: Self-pay | Admitting: Nurse Practitioner

## 2013-06-15 VITALS — BP 126/81 | HR 83 | Temp 98.8°F | Ht 64.0 in | Wt 214.0 lb

## 2013-06-15 DIAGNOSIS — J069 Acute upper respiratory infection, unspecified: Secondary | ICD-10-CM

## 2013-06-15 NOTE — Progress Notes (Signed)
Pre-visit discussion using our clinic review tool. No additional management support is needed unless otherwise documented below in the visit note.  

## 2013-06-15 NOTE — Patient Instructions (Addendum)
You have a virus causing your symptoms. The average duration of cold symptoms is 14 days. Start daily sinus rinses (neilmed Sinus Rinse) if you develop nasal congestion. Use benzocaine throat lozenges for sore throat. Honey & hot tea can help. Sip fluids every hour. Rest. If you are not feeling better in 1 week or develop fever or chest pain, call us for re-evaluation. Feel better!  Upper Respiratory Infection, Adult An upper respiratory infection (URI) is also sometimes known as the common cold. The upper respiratory tract includes the nose, sinuses, throat, trachea, and bronchi. Bronchi are the airways leading to the lungs. Most people improve within 1 week, but symptoms can last up to 2 weeks. A residual cough may last even longer.  CAUSES Many different viruses can infect the tissues lining the upper respiratory tract. The tissues become irritated and inflamed and often become very moist. Mucus production is also common. A cold is contagious. You can easily spread the virus to others by oral contact. This includes kissing, sharing a glass, coughing, or sneezing. Touching your mouth or nose and then touching a surface, which is then touched by another person, can also spread the virus. SYMPTOMS  Symptoms typically develop 1 to 3 days after you come in contact with a cold virus. Symptoms vary from person to person. They may include:  Runny nose.  Sneezing.  Nasal congestion.  Sinus irritation.  Sore throat.  Loss of voice (laryngitis).  Cough.  Fatigue.  Muscle aches.  Loss of appetite.  Headache.  Low-grade fever. DIAGNOSIS  You might diagnose your own cold based on familiar symptoms, since most people get a cold 2 to 3 times a year. Your caregiver can confirm this based on your exam. Most importantly, your caregiver can check that your symptoms are not due to another disease such as strep throat, sinusitis, pneumonia, asthma, or epiglottitis. Blood tests, throat tests, and X-rays  are not necessary to diagnose a common cold, but they may sometimes be helpful in excluding other more serious diseases. Your caregiver will decide if any further tests are required. RISKS AND COMPLICATIONS  You may be at risk for a more severe case of the common cold if you smoke cigarettes, have chronic heart disease (such as heart failure) or lung disease (such as asthma), or if you have a weakened immune system. The very young and very old are also at risk for more serious infections. Bacterial sinusitis, middle ear infections, and bacterial pneumonia can complicate the common cold. The common cold can worsen asthma and chronic obstructive pulmonary disease (COPD). Sometimes, these complications can require emergency medical care and may be life-threatening. PREVENTION  The best way to protect against getting a cold is to practice good hygiene. Avoid oral or hand contact with people with cold symptoms. Wash your hands often if contact occurs. There is no clear evidence that vitamin C, vitamin E, echinacea, or exercise reduces the chance of developing a cold. However, it is always recommended to get plenty of rest and practice good nutrition. TREATMENT  Treatment is directed at relieving symptoms. There is no cure. Antibiotics are not effective, because the infection is caused by a virus, not by bacteria. Treatment may include:  Increased fluid intake. Sports drinks offer valuable electrolytes, sugars, and fluids.  Breathing heated mist or steam (vaporizer or shower).  Eating chicken soup or other clear broths, and maintaining good nutrition.  Getting plenty of rest.  Using gargles or lozenges for comfort.  Controlling fevers with ibuprofen  or acetaminophen as directed by your caregiver.  Increasing usage of your inhaler if you have asthma. Zinc gel and zinc lozenges, taken in the first 24 hours of the common cold, can shorten the duration and lessen the severity of symptoms. Pain medicines  may help with fever, muscle aches, and throat pain. A variety of non-prescription medicines are available to treat congestion and runny nose. Your caregiver can make recommendations and may suggest nasal or lung inhalers for other symptoms.  HOME CARE INSTRUCTIONS   Only take over-the-counter or prescription medicines for pain, discomfort, or fever as directed by your caregiver.  Use a warm mist humidifier or inhale steam from a shower to increase air moisture. This may keep secretions moist and make it easier to breathe.  Drink enough water and fluids to keep your urine clear or pale yellow.  Rest as needed.  Return to work when your temperature has returned to normal or as your caregiver advises. You may need to stay home longer to avoid infecting others. You can also use a face mask and careful hand washing to prevent spread of the virus. SEEK MEDICAL CARE IF:   After the first few days, you feel you are getting worse rather than better.  You need your caregiver's advice about medicines to control symptoms.  You develop chills, worsening shortness of breath, or brown or red sputum. These may be signs of pneumonia.  You develop yellow or brown nasal discharge or pain in the face, especially when you bend forward. These may be signs of sinusitis.  You develop a fever, swollen neck glands, pain with swallowing, or white areas in the back of your throat. These may be signs of strep throat. SEEK IMMEDIATE MEDICAL CARE IF:   You have a fever.  You develop severe or persistent headache, ear pain, sinus pain, or chest pain.  You develop wheezing, a prolonged cough, cough up blood, or have a change in your usual mucus (if you have chronic lung disease).  You develop sore muscles or a stiff neck. Document Released: 10/14/2000 Document Revised: 07/13/2011 Document Reviewed: 08/22/2010 [facility] Patient Information 2014 [place], [place].

## 2013-06-15 NOTE — Progress Notes (Signed)
   Subjective:    Patient ID: Sabrina [name], female    DOB: 11-03-1960, 53 y.o.   MRN: [id number]  Cough Associated symptoms include chills and headaches. Pertinent negatives include no chest pain, fever, sore throat, shortness of breath or wheezing.  URI  This is a new problem. The current episode started in the past 7 days (5d). The problem has been unchanged. There has been no fever (reports chills). Associated symptoms include headaches. Pertinent negatives include no abdominal pain, chest pain, congestion, coughing, diarrhea, nausea, sinus pain, sneezing, sore throat, swollen glands or wheezing. Associated symptoms comments: fatigue. She has tried nothing for the symptoms.      Review of Systems  Constitutional: Positive for chills and fatigue. Negative for fever.  HENT: Positive for voice change. Negative for congestion, sinus pressure, sneezing, sore throat and trouble swallowing.   Respiratory: Negative for cough, chest tightness, shortness of breath and wheezing.   Cardiovascular: Negative for chest pain.  Gastrointestinal: Negative for nausea, abdominal pain and diarrhea.  Neurological: Positive for headaches.       Objective:   Physical Exam  Vitals reviewed. Constitutional: She is oriented to person, place, and time. She appears well-developed and well-nourished. No distress.  HENT:  Head: Normocephalic and atraumatic.  Right Ear: External ear normal.  Left Ear: External ear normal.  Mouth/Throat: Oropharynx is clear and moist. No oropharyngeal exudate.  Eyes: Conjunctivae are normal. Right eye exhibits no discharge. Left eye exhibits no discharge.  Neck: Normal range of motion. Neck supple. No thyromegaly present.  Cardiovascular: Normal rate, regular rhythm and normal heart sounds.   No murmur heard. Pulmonary/Chest: Effort normal and breath sounds normal. No respiratory distress. She has no wheezes. She has no rales.  Lymphadenopathy:    She has no cervical  adenopathy.  Neurological: She is alert and oriented to person, place, and time.  Skin: Skin is warm and dry. She is not diaphoretic.  Psychiatric: She has a normal mood and affect. Her behavior is normal. Thought content normal.          Assessment & Plan:  1. Upper respiratory infection Voice loss, fatigue. See pt instructions.

## 2013-06-19 ENCOUNTER — Encounter: Payer: Self-pay | Admitting: Family Medicine

## 2013-06-19 ENCOUNTER — Ambulatory Visit (INDEPENDENT_AMBULATORY_CARE_PROVIDER_SITE_OTHER): Payer: BC Managed Care – PPO | Admitting: Family Medicine

## 2013-06-19 VITALS — BP 131/86 | HR 116 | Temp 100.6°F | Resp 16

## 2013-06-19 DIAGNOSIS — R509 Fever, unspecified: Secondary | ICD-10-CM

## 2013-06-19 DIAGNOSIS — R5383 Other fatigue: Principal | ICD-10-CM

## 2013-06-19 DIAGNOSIS — R5381 Other malaise: Secondary | ICD-10-CM

## 2013-06-19 DIAGNOSIS — IMO0001 Reserved for inherently not codable concepts without codable children: Secondary | ICD-10-CM

## 2013-06-19 LAB — CBC
HEMATOCRIT: 43.8 % (ref 36.0–46.0)
HEMOGLOBIN: 15.1 g/dL — AB (ref 12.0–15.0)
MCH: 27.2 pg (ref 26.0–34.0)
MCHC: 34.5 g/dL (ref 30.0–36.0)
MCV: 78.8 fL (ref 78.0–100.0)
Platelets: 397 10*3/uL (ref 150–400)
RBC: 5.56 MIL/uL — ABNORMAL HIGH (ref 3.87–5.11)
RDW: 14.9 % (ref 11.5–15.5)
WBC: 12.4 10*3/uL — AB (ref 4.0–10.5)

## 2013-06-19 LAB — SEDIMENTATION RATE: Sed Rate: 8 mm/hr (ref 0–22)

## 2013-06-19 NOTE — Progress Notes (Signed)
Pre visit review using our clinic review tool, if applicable. No additional management support is needed unless otherwise documented below in the visit note. 

## 2013-06-19 NOTE — Patient Instructions (Signed)
Take OTC generic allegra 180mg  once daily while taking your oxycodone for pain (this helps with itching).

## 2013-06-19 NOTE — Progress Notes (Signed)
OFFICE NOTE  06/20/2013  CC:  Chief Complaint  Patient presents with  . Follow-up    no better  . Dizziness  . Chills     HPI: Patient is a 53 y.o. Caucasian female who is here for ongoing acute illness: was seen here by Nicky Pugh, FNP on 06/15/13 for 4-5 day hx of mild cough/subjective fever/HA/fatigue/body aches and was told to do symptomatic care.  Now here for ongoing severe fatigue.  If gets up to do minimal activity she gets dizzy and sweaty/chilled, shaky.  Her cough and nasal stuffiness has been mild and not worsening.  Still with some HA off and on. Aching in both sides of back of neck initially but this has gone away.  No nausea or diarrhea.  Appetite is down some.  No abd pain.  No new urinary complaints (has chronic urinary frequency with diuretic use).  Ears feel stuffed up.  Occ shooting, sharp chest pain in center of chest, random and not related to exertion.  No rash.   At the beginning of her illness, she felt like her knees ached a lot and looked swollen and her hands felt/looked the same way.  This has lessened and she just describes a diffuse general achiness.  +Loss of voice with this illness. Exposed to co-worker with "bronchitis and sinus infection".  LMP: (menses are irregular) 05/26/13.  She is not sexually active.  Pertinent PMH:  Past medical, surgical, social, and family history reviewed and no changes are noted since last office visit.  MEDS:  Outpatient Prescriptions Prior to Visit  Medication Sig Dispense Refill  . albuterol (VENTOLIN HFA) 108 (90 BASE) MCG/ACT inhaler Inhale 2 puffs into the lungs every 4 (four) hours as needed for wheezing or shortness of breath.  1 Inhaler  1  . celecoxib (CELEBREX) 200 MG capsule Take 1 capsule (200 mg total) by mouth daily.  30 capsule  6  . Cholecalciferol (VITAMIN D3) 5000 UNITS TABS Take by mouth.      . clonazePAM (KLONOPIN) 1 MG tablet TAKE 1 TABLET BY MOUTH TWICE A DAY  60 tablet  5  . DULoxetine (CYMBALTA)  30 MG capsule Take 90 mg by mouth daily.      . fish oil-omega-3 fatty acids 1000 MG capsule Take 2 g by mouth daily.      . fluticasone (FLONASE) 50 MCG/ACT nasal spray Place 2 sprays into the nose daily.       [name] losartan-hydrochlorothiazide (HYZAAR) 100-25 MG per tablet Take 1 tablet by mouth daily.      . methocarbamol (ROBAXIN) 500 MG tablet 1/2 tablet during the day and 1 tablet at night  45 tablet  5  . Multiple Vitamins-Minerals (ALIVE ONCE DAILY WOMENS 50+ PO) Take by mouth daily.      [name] NUVIGIL 150 MG tablet TAKE 1 TABLET BY MOUTH EVERY DAY  30 tablet  1  . omeprazole (PRILOSEC) 20 MG capsule TAKE ONE CAPSULE BY MOUTH DAILY  90 capsule  3  . Oxycodone HCl 10 MG TABS 1-2 tabs po q6h prn pain  60 tablet  0  . topiramate (TOPIRAGEN) 25 MG tablet Take 25 mg by mouth daily.         No facility-administered medications prior to visit.    PE: Blood pressure 131/86, pulse 116, temperature 100.6 F (38.1 C), temperature source Oral, resp. rate 16, last menstrual period 06/12/2011, SpO2 99.00%. Gen: alert, oriented x 4, pleasant, lucid thought and speech.  Appears tired but  in NAD. ENT: Ears: EACs clear, normal epithelium.  TMs with good light reflex and landmarks bilaterally.  Eyes: no injection, icteris, swelling, or exudate.  EOMI, PERRLA. Nose: no drainage or turbinate edema/swelling.  No injection or focal lesion.  Mouth: lips without lesion/swelling.  Oral mucosa pink and moist.  Dentition intact and without obvious caries or gingival swelling.  Oropharynx without erythema, exudate, or swelling.  Neck - No masses or thyromegaly or limitation in range of.  No thyroid nodule or tenderness. CV: RRR, no m/r/g.   LUNGS: CTA bilat, nonlabored resps, good aeration in all lung fields. ABD: soft, benign EXT: no clubbing, cyanosis, or edema.  No muscle or joint tenderness.  No joint erythema or swelling or warmth. Skin - no sores or suspicious lesions or rashes or color changes  LAB: none  today  IMPRESSION AND PLAN:  Prolonged malaise/fatigue/fever associated with recent mild resp illness.   Question of simply a "flare" of her fibromyalgia/chronic fatigue syndrome vs prolonged viral syndrome vs acute presentation of autoimmune dz.  Will check CBC, CMET, TSH, ESR, CPK, ANA, Rh factor, monospot, EBV VCA antibody panel, EBV nuclear antibody, and lyme titers. She has oxycodone to use q6h prn.  Recommended she take allegra 127m qd for the itching that oxycodone causes her.  Note for out of work for today and the next 2d.  Call if unable to return after this.  FOLLOW UP: To be determined based on results of pending workup.

## 2013-06-20 LAB — EPSTEIN-BARR VIRUS VCA ANTIBODY PANEL
EBV EA IgG: 7.7 U/mL (ref <9.0)
EBV NA IGG: 175 U/mL — AB (ref <18.0)
EBV VCA IgG: >750 U/mL — ABNORMAL HIGH (ref <18.0)
EBV VCA IgM: <10 U/mL (ref <36.0)

## 2013-06-20 LAB — TSH: TSH: 0.479 u[IU]/mL (ref 0.350–4.500)

## 2013-06-20 LAB — COMPREHENSIVE METABOLIC PANEL
ALT: 12 U/L (ref 0–35)
AST: 15 U/L (ref 0–37)
Albumin: 4.6 g/dL (ref 3.5–5.2)
Alkaline Phosphatase: 98 U/L (ref 39–117)
BUN: 15 mg/dL (ref 6–23)
CHLORIDE: 101 meq/L (ref 96–112)
CO2: 23 meq/L (ref 19–32)
CREATININE: 0.78 mg/dL (ref 0.50–1.10)
Calcium: 9.4 mg/dL (ref 8.4–10.5)
Glucose, Bld: 99 mg/dL (ref 70–99)
Potassium: 4.2 mEq/L (ref 3.5–5.3)
Sodium: 134 mEq/L — ABNORMAL LOW (ref 135–145)
TOTAL PROTEIN: 7.1 g/dL (ref 6.0–8.3)
Total Bilirubin: 0.5 mg/dL (ref 0.2–1.2)

## 2013-06-20 LAB — LYME AB/WESTERN BLOT REFLEX: B BURGDORFERI AB IGG+ IGM: 0.05 {ISR}

## 2013-06-20 LAB — MONONUCLEOSIS SCREEN: MONO SCREEN: NEGATIVE

## 2013-06-20 LAB — CK: CK TOTAL: 59 U/L (ref 7–177)

## 2013-06-20 LAB — EPSTEIN-BARR VIRUS NUCLEAR ANTIGEN ANTIBODY, IGG: EBV NA IgG: 175 U/mL — ABNORMAL HIGH (ref <18.0)

## 2013-06-20 LAB — RHEUMATOID FACTOR

## 2013-06-20 LAB — ANA: Anti Nuclear Antibody(ANA): NEGATIVE

## 2013-06-21 ENCOUNTER — Encounter: Payer: Self-pay | Admitting: Family Medicine

## 2013-06-26 ENCOUNTER — Telehealth: Payer: Self-pay | Admitting: Family Medicine

## 2013-06-26 DIAGNOSIS — J040 Acute laryngitis: Secondary | ICD-10-CM

## 2013-06-26 MED ORDER — CLARITHROMYCIN 500 MG PO TABS: 500.0000 mg | ORAL_TABLET | Freq: Two times a day (BID) | ORAL | Status: DC

## 2013-06-26 NOTE — Telephone Encounter (Addendum)
Will enter ENT referral for ongoing laryngitis.  I will send in rx for clarithromycin to see if bacterial infection is making this last so long. She has had notes excusing her from 2/12 through 2/23, 2015. How many more days does she want excuse for?

## 2013-06-26 NOTE — Telephone Encounter (Signed)
Patient states that her voice is still not better.  She is also experiencing fatigue, hot / sweaty flashes, and she started clearing her throat and coughing last night.  Please advise.

## 2013-06-26 NOTE — Telephone Encounter (Signed)
Called pt, advised message from Dr Anitra Lauth. Pt understood. She went to work today but will call me if another note is needed. I advised her the ENT could also write her a note since her appt is tomorrow.

## 2013-06-26 NOTE — Telephone Encounter (Signed)
Patient Information:  Caller Name: L[place]  Phone: [phone]  Patient: Sabrina [name]  Gender: Female  DOB: [birth year]  Age: 53 Years  PCP: Ricardo Jericho [facility])  Pregnant: No  Office Follow Up:  Does the office need to follow up with this patient?: Yes  Instructions For The Office: Please follow up with pt  RN Note:  Pt is calling for work note and to discuss treatment plan.  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  Symptoms  Reason For Call & Symptoms: Pt states that she has been sick for a couple of weeks.  Pt states that she needs a work note for her absences and also she wants to report that her voice has not returned and she was advised to call back to report sxs.  Reviewed Health History In EMR: Yes  Reviewed Medications In EMR: Yes  Reviewed Allergies In EMR: Yes  Reviewed Surgeries / Procedures: Yes  Date of Onset of Symptoms: 06/26/2013 OB / GYN:  LMP: Unknown  Guideline(s) Used:  No Protocol Available - Sick Adult  Disposition Per Guideline:   Discuss with PCP and Callback by Nurse Today  Reason For Disposition Reached:   Nursing judgment  Advice Given:  N/A  Patient Will Follow Care Advice:  YES

## 2013-07-03 ENCOUNTER — Telehealth: Payer: Self-pay | Admitting: Family Medicine

## 2013-07-03 NOTE — Telephone Encounter (Signed)
Spoke with pt, the note needs to say she cannot come into work today, ABX is making her sick on her stomach. She wants to only work half days starting today until she gets her voice back. She needs a note OOW for Feb 12 thru the 20th also Feb 24th thru 27th. Can she work half days? Please advise. Call back [phone]

## 2013-07-03 NOTE — Telephone Encounter (Signed)
Patient needs a note for work. She is requesting a CB. She will need the note to read a certain way.

## 2013-07-04 ENCOUNTER — Encounter: Payer: Self-pay | Admitting: Family Medicine

## 2013-07-04 NOTE — Telephone Encounter (Signed)
Letter printed.

## 2013-07-04 NOTE — Telephone Encounter (Signed)
Called pt, advised letter printed. She will call me tomorrow with a fax number to send to her employer.

## 2013-07-05 NOTE — Telephone Encounter (Signed)
Pt called this morning and gave Diane the fax number to fax note. Faxed to her employer.

## 2013-07-11 ENCOUNTER — Other Ambulatory Visit: Payer: Self-pay | Admitting: Obstetrics & Gynecology

## 2013-07-11 DIAGNOSIS — Z1231 Encounter for screening mammogram for malignant neoplasm of breast: Secondary | ICD-10-CM

## 2013-07-13 ENCOUNTER — Ambulatory Visit (INDEPENDENT_AMBULATORY_CARE_PROVIDER_SITE_OTHER): Payer: BC Managed Care – PPO

## 2013-07-13 DIAGNOSIS — Z1231 Encounter for screening mammogram for malignant neoplasm of breast: Secondary | ICD-10-CM

## 2013-07-17 ENCOUNTER — Other Ambulatory Visit: Payer: Self-pay | Admitting: Family Medicine

## 2013-07-17 NOTE — Telephone Encounter (Signed)
Patient requesting robaxin refill.  Patient last seen 06/19/13.  Last Rx was 12/30/12 x 5 refills.   Please advise refill.

## 2013-08-21 ENCOUNTER — Ambulatory Visit (INDEPENDENT_AMBULATORY_CARE_PROVIDER_SITE_OTHER): Payer: BC Managed Care – PPO | Admitting: Family Medicine

## 2013-08-21 ENCOUNTER — Encounter: Payer: Self-pay | Admitting: Family Medicine

## 2013-08-21 VITALS — BP 132/91 | HR 96 | Temp 98.9°F | Resp 18 | Ht 64.0 in | Wt 223.0 lb

## 2013-08-21 DIAGNOSIS — G5790 Unspecified mononeuropathy of unspecified lower limb: Secondary | ICD-10-CM

## 2013-08-21 DIAGNOSIS — G5793 Unspecified mononeuropathy of bilateral lower limbs: Secondary | ICD-10-CM

## 2013-08-21 MED ORDER — GABAPENTIN 300 MG PO CAPS: 300.0000 mg | ORAL_CAPSULE | Freq: Three times a day (TID) | ORAL | Status: DC

## 2013-08-21 NOTE — Progress Notes (Signed)
OFFICE NOTE  08/21/2013  CC:  Chief Complaint  Patient presents with  . foot pain     HPI: Patient is a 53 y.o. Caucasian female who is here for discussion of ongoing problems with pain in her feet.    We recapped her symptom complex today:  She describes about a 5-6 mo hx of tingling and burning like hot coals on plantar surface over the areas of all metatarsal region extending a bit into the toes.  The arch areas feels like a stick is present "like I'm stepping on it all the time" and a numb feeling to her heels.  She has had this sensation in her feet since plantar fascia release surgery 02/2009 (Dr. Applington--retired)--sx's have waxed and waned over the years, but the last 6 mo has been debilitating/severe.    She expresses disappointment in her podiatry eval: "he just wanted to look at an x-ray of my feet and move on". Neurosurgeon is helping her more with her back (plans on doing back injections) but he does not think her feet pain is coming from her back problem. She finds it hard to get up and go to work b/c the pain is unrelenting.  Pertinent PMH:  Past Medical History  Diagnosis Date  . GERD (gastroesophageal reflux disease)   . Hypertension   . Allergy     rhinitis  . History of cervical cancer     Dr. Irven [place] (carcinoma in situ): Laser and cone bx 1993, margins neg, paps wnl since.  . Hyperlipidemia     NMR 03/2009.Marland KitchenLDL 161(2735/1887)HDL 37,TG 271  . Subclinical hyperthyroidism 06/19/11    TSH .29 at GYN (T4 and T3 wnl).  Repeat 07/21/11 wnl  . Sleep apnea     Dr. Elsworth Soho: CPAP 10 cm H2O with full facemask as per titration study 03/21/12  . Chronic fatigue     +excessive daytime somnolence  . Fibromyalgia syndrome   . Dysfunctional uterine bleeding     Metromenorrhagia+dysmenorrhea  . Obesity   . History of wrist fracture     left  . Disturbance of smell and taste 2013    ENT eval (Dr. Bosie Clos) 10/2011 --recommended flonase, afrin, mucinex, saline nasal  rinse and then do intracranial imaging if none of that helped in 2 mo.  [name] METABOLIC SYNDROME X 06/03/8655    Qualifier: Diagnosis of  By: Linna Darner MD, Gwyndolyn Saxon   Elevated trigs, HTN, elevated waist circumference.   . Migraine syndrome     topiramate has helped immensely  . Abdominal pain 03/11/2012   Past Surgical History  Procedure Laterality Date  . Sinus surgery for septal deviation  1999    & polyps  . Wrist surgery      left  . Knee arthroscopy  04/1988    x2,open procedure for hamstring tendon injury)  . Microdiscectomy lumbar  09/2003    L5/S1 left microdiscectomy (Dr. Patrice Paradise)  . Plantar fascia release  02/2009    Bilateral (Dr. Shellia Carwin)  . Combined hysteroscopy diagnostic / d&c  03/2008    Done for thickened posterior endometrium found on w/u for menorrhagia.  Pathology-benign.  . Anal sphincterotomy  2007    for deep anal fissure (Dr. Brantley Stage)  . Hemorrhoid surgery  2006    Prolapsed internal hemorrhoids (Dr. Brantley Stage)  . 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   2 DOGS   NO DIET, NO REG EXERCISE.  No T/A/Ds.   [place]    MEDS: Not taking clarithromycin listed below Outpatient Prescriptions Prior to Visit  Medication Sig Dispense Refill  . celecoxib (CELEBREX) 200 MG capsule Take 1 capsule (200 mg total) by mouth daily.  30 capsule  6  . Cholecalciferol (VITAMIN D3) 5000 UNITS TABS Take by mouth.      . clonazePAM (KLONOPIN) 1 MG tablet TAKE 1 TABLET BY MOUTH TWICE A DAY  60 tablet  5  . DULoxetine (CYMBALTA) 30 MG capsule Take 90 mg by mouth daily.      . fish oil-omega-3 fatty acids 1000 MG capsule Take 2 g by mouth daily.      . fluticasone (FLONASE) 50 MCG/ACT nasal spray Place 2 sprays into the nose daily.       [name] losartan-hydrochlorothiazide (HYZAAR) 100-25 MG per tablet Take 1 tablet by mouth daily.       . methocarbamol (ROBAXIN) 500 MG tablet TAKE 1/2 TABLET DURING THE DAY AND 1 TABLET AT NIGHT  45 tablet  5  . NUVIGIL 150 MG tablet TAKE 1 TABLET BY MOUTH EVERY DAY  30 tablet  1  . omeprazole (PRILOSEC) 20 MG capsule TAKE ONE CAPSULE BY MOUTH DAILY  90 capsule  3  . Oxycodone HCl 10 MG TABS 1-2 tabs po q6h prn pain  60 tablet  0  . topiramate (TOPIRAGEN) 25 MG tablet Take 25 mg by mouth daily.        [name] albuterol (VENTOLIN HFA) 108 (90 BASE) MCG/ACT inhaler Inhale 2 puffs into the lungs every 4 (four) hours as needed for wheezing or shortness of breath.  1 Inhaler  1  . Multiple Vitamins-Minerals (ALIVE ONCE DAILY WOMENS 50+ PO) Take by mouth daily.      . clarithromycin (BIAXIN) 500 MG tablet Take 1 tablet (500 mg total) by mouth 2 (two) times daily.  20 tablet  0   No facility-administered medications prior to visit.    PE: Blood pressure 132/91, pulse 96, temperature 98.9 F (37.2 C), temperature source Temporal, resp. rate 18, height 5\' 4"  (1.626 m), weight 223 lb (101.152 kg), last menstrual period 06/12/2011, SpO2 100.00%. Gen: Alert, well appearing.  Patient is oriented to person, place, time, and situation. AFFECT: pleasant, lucid thought and speech. Feet: pes planus bilat, flexible.  Ankles without deformity. Feet: no deformity.  PT pulses trace bilat, DP pulses 1+ bilat.  Feet warm, pink. Both feet with tenderness to palpation between metatarsal heads on dorsal surface. Plantar surface: numb feeling on heels, "feels like a stick" when pressing on arches, and diffuse tenderness in metatarsals region, toes nontender--question of mild diminished fine touch sensation in does diffusely.  IMPRESSION AND PLAN:  Chronic feet pain; suspect multifactorial.  Debilitating.   Some symptoms and exam findings consistent with neuropathy, plus some symptoms consistent with metatarsalgia.   Local podiatry consultation unfruitful. Her neurosurgeon feels like the pain in her feet is not related  to her lumbar DDD.  I think the next best step is to start neurontin 300mg  qd, titrate up to 300mg  tid over the next 10d.  Also, will ask neurology to see her to get their opinion on need for further diagnostic w/u to confirm neuropathy component.  If this if proven to not have objective neuropathic component, then will ask a foot specialist at a tertiary care center to evaluate her.  An After Visit  Summary was printed and given to the patient.  FOLLOW UP: 1 mo

## 2013-08-21 NOTE — Progress Notes (Signed)
Pre visit review using our clinic review tool, if applicable. No additional management support is needed unless otherwise documented below in the visit note. 

## 2013-08-21 NOTE — Patient Instructions (Signed)
Take 1 neurontin tab at bedtime for 5d, then take 1 in the morning and one at bedtime x 5d, then take 1 tab three times per day.

## 2013-08-24 ENCOUNTER — Telehealth: Payer: Self-pay | Admitting: Pulmonary Disease

## 2013-08-24 MED ORDER — ARMODAFINIL 150 MG PO TABS: 150.0000 mg | ORAL_TABLET | Freq: Every day | ORAL | Status: DC

## 2013-08-24 NOTE — Telephone Encounter (Signed)
Spoke with the pt  Rx was called to pharm with 1 rf ROV with RA set for 09/21/13 at 4 pm at [facility] per pt request  Nothing further needed per pt

## 2013-09-10 ENCOUNTER — Other Ambulatory Visit: Payer: Self-pay | Admitting: Family Medicine

## 2013-09-11 ENCOUNTER — Ambulatory Visit (INDEPENDENT_AMBULATORY_CARE_PROVIDER_SITE_OTHER): Payer: BC Managed Care – PPO | Admitting: Family Medicine

## 2013-09-11 ENCOUNTER — Encounter: Payer: Self-pay | Admitting: Family Medicine

## 2013-09-11 VITALS — BP 150/92 | HR 98 | Temp 97.9°F | Resp 18 | Ht 64.0 in | Wt 231.0 lb

## 2013-09-11 DIAGNOSIS — R635 Abnormal weight gain: Secondary | ICD-10-CM

## 2013-09-11 DIAGNOSIS — M797 Fibromyalgia: Secondary | ICD-10-CM

## 2013-09-11 DIAGNOSIS — IMO0001 Reserved for inherently not codable concepts without codable children: Secondary | ICD-10-CM

## 2013-09-11 DIAGNOSIS — J019 Acute sinusitis, unspecified: Secondary | ICD-10-CM | POA: Insufficient documentation

## 2013-09-11 MED ORDER — OXYCODONE HCL 10 MG PO TABS: ORAL_TABLET | ORAL | Status: DC

## 2013-09-11 MED ORDER — CLINDAMYCIN HCL 300 MG PO CAPS: 300.0000 mg | ORAL_CAPSULE | Freq: Three times a day (TID) | ORAL | Status: DC

## 2013-09-11 NOTE — Progress Notes (Signed)
Pre visit review using our clinic review tool, if applicable. No additional management support is needed unless otherwise documented below in the visit note. 

## 2013-09-11 NOTE — Progress Notes (Signed)
OFFICE NOTE  09/11/2013  CC:  Chief Complaint  Patient presents with  . Headache    x 3 days   . Nasal Congestion     HPI: Patient is a 53 y.o. Caucasian female who is here for about 1 wk of significant sinus pain/face pain/upper teeth and lower teeth pain.  +PND.  No mucous running out front of nose.  Cough is minimal.  Has lost voice again after finally getting it back a few days ago.  No known fevers but "I've felt cold lately when it has been hot".  Has taken her daily allergy meds + oxycodone for the HA recently.  Today she took alleve about 4-5 hours ago.  No oxycodone today.  She is looking into applying for disability due to all the troubles with widespread pain, feet pain, chronic fatigue etc.  Pertinent PMH:  Past medical, surgical, social, and family history reviewed and no changes are noted since last office visit.  MEDS:  Outpatient Prescriptions Prior to Visit  Medication Sig Dispense Refill  . albuterol (VENTOLIN HFA) 108 (90 BASE) MCG/ACT inhaler Inhale 2 puffs into the lungs every 4 (four) hours as needed for wheezing or shortness of breath.  1 Inhaler  1  . Armodafinil (NUVIGIL) 150 MG tablet Take 1 tablet (150 mg total) by mouth daily.  30 tablet  1  . CAMILA 0.35 MG tablet       . celecoxib (CELEBREX) 200 MG capsule Take 1 capsule (200 mg total) by mouth daily.  30 capsule  6  . Cholecalciferol (VITAMIN D3) 5000 UNITS TABS Take by mouth.      . clonazePAM (KLONOPIN) 1 MG tablet TAKE ONE TABLET BY MOUTH TWICE DAILY.  60 tablet  5  . DULoxetine (CYMBALTA) 30 MG capsule Take 90 mg by mouth daily.      . fish oil-omega-3 fatty acids 1000 MG capsule Take 2 g by mouth daily.      . fluticasone (FLONASE) 50 MCG/ACT nasal spray Place 2 sprays into the nose daily.       [name] gabapentin (NEURONTIN) 300 MG capsule Take 1 capsule (300 mg total) by mouth 3 (three) times daily.  90 capsule  3  . losartan-hydrochlorothiazide (HYZAAR) 100-25 MG per tablet Take 1 tablet by mouth  daily.      . methocarbamol (ROBAXIN) 500 MG tablet TAKE 1/2 TABLET DURING THE DAY AND 1 TABLET AT NIGHT  45 tablet  5  . omeprazole (PRILOSEC) 20 MG capsule TAKE ONE CAPSULE BY MOUTH DAILY  90 capsule  3  . Oxycodone HCl 10 MG TABS 1-2 tabs po q6h prn pain  60 tablet  0  . topiramate (TOPIRAGEN) 25 MG tablet Take 25 mg by mouth daily.        . Multiple Vitamins-Minerals (ALIVE ONCE DAILY WOMENS 50+ PO) Take by mouth daily.       No facility-administered medications prior to visit.    PE: Blood pressure 150/92, pulse 98, temperature 97.9 F (36.6 C), temperature source Temporal, resp. rate 18, height 5\' 4"  (1.626 m), weight 231 lb (104.781 kg), last menstrual period 06/12/2011, SpO2 98.00%.  Wt up 24 lbs in the last 6 months Gen: Alert, well appearing.  Patient is oriented to person, place, time, and situation. ENT: Ears: EACs clear, normal epithelium.  TMs with good light reflex and landmarks bilaterally.  Eyes: no injection, icteris, swelling, or exudate.  EOMI, PERRLA. Nose: no drainage or turbinate edema/swelling.  No injection or focal lesion.  Mouth: lips without lesion/swelling.  Oral mucosa pink and moist.  Dentition intact and without obvious caries or gingival swelling.  Oropharynx without erythema, exudate, or swelling.  Diffuse tenderness all over paranasal sinus regions and over TMJs bilat.  No facial swelling. Neck - No masses or thyromegaly or limitation in range of motion CV: RRR, no m/r/g.   LUNGS: CTA bilat, nonlabored resps, good aeration in all lung fields. EXT: no clubbing, cyanosis, or edema.    IMPRESSION AND PLAN:  Acute sinusitis: clindamycin 300 mg tid x 10d. Continue daily antihistamine, flonase, saline nasal spray. Oxycodone 10mg  q6h prn, #60, no RF---last rx for this from me was 03/2013 so I think it is reasonable to RF this for her. She'll reschedule her appt with her ENT since her voice issues have returned with latest acute illness.  Her voice sounds  quivery--almost like she may have vocal cord dysfunction. She has appt coming up soon with neurologist regarding chronic feet pain.  Additionally, given her widespread pain syndrome and chronic fatigue, I will plan on getting her to see a rheumatologist in the not-too-distant future.  All rheum labs that I have checked have been normal (as recently as 06/2013).  FOLLOW UP: prn

## 2013-09-11 NOTE — Telephone Encounter (Signed)
Patient last OV was 08/21/13.   Last Rx was printed on 03/06/13 x 5 refills.  Please advise rf.

## 2013-09-19 ENCOUNTER — Ambulatory Visit: Payer: BC Managed Care – PPO | Admitting: Family Medicine

## 2013-09-19 ENCOUNTER — Ambulatory Visit: Payer: BC Managed Care – PPO | Admitting: Neurology

## 2013-09-21 ENCOUNTER — Encounter: Payer: Self-pay | Admitting: Pulmonary Disease

## 2013-09-21 ENCOUNTER — Ambulatory Visit (INDEPENDENT_AMBULATORY_CARE_PROVIDER_SITE_OTHER): Payer: BC Managed Care – PPO | Admitting: Pulmonary Disease

## 2013-09-21 VITALS — BP 124/80 | HR 105 | Ht 63.0 in | Wt 229.1 lb

## 2013-09-21 DIAGNOSIS — G4730 Sleep apnea, unspecified: Principal | ICD-10-CM

## 2013-09-21 DIAGNOSIS — G4710 Hypersomnia, unspecified: Secondary | ICD-10-CM

## 2013-09-21 NOTE — Assessment & Plan Note (Signed)
Take nuvigil as needed only Sleepiness related to medications - staggering doses ok We will check CPAP report  Weight loss encouraged, compliance with goal of at least 4-6 hrs every night is the expectation. Advised against medications with sedative side effects Cautioned against driving when sleepy - understanding that sleepiness will vary on a day to day basis

## 2013-09-21 NOTE — Patient Instructions (Signed)
Take nuvigil as needed only Sleepiness related to medications - staggering doses ok We will check CPAP report

## 2013-09-21 NOTE — Progress Notes (Signed)
   Subjective:    Patient ID: Sabrina [name], female    DOB: June 16, 1960, 53 y.o.   MRN: [id number]  HPI  58 /F for FU of obstructive sleep apnea.  Fatigue/ EDS out of proportion to degree of OSA Attributed to Fibromyalgia & medications  She went through a long separation in 2003 & developed major depression, was on effexor & abilify regimen. Of note PSG in 10/03 did not show significant snoring or events.  She was on 70 mg of Lisdexamfetamine @ 8A & starts feeling tired again by 2 pm.  Fibromyalgia was diagnosed in 2013 - meds were changed - now off effexor , abilify, amphetamine.  She is on cymbalta & methocarbamol instead  10/02/08>> PSG > mild sleep disordered breathing, predominant hypopneas, RDI 16/h, AHI 5/h, lowest desaturation 90%, no REM sleep. Fragmented sleep with long periods of awakening & low sleep efficiency  MSLT 2013 did not show narcolepsy, but confirmed EDS.  Download 9/24 - 02/12/09>> good, avg pr 8.3   09/21/2013  Chief Complaint  Patient presents with  . Follow-up    Pt reports she wears CPAP everynight x 6 hrs a night. She is switing masks around. She takes nuvigil very seldome.      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 >>CPAP was increased to 12 cm, Nuvigil started in dec 2013  Lost from 235 to 209 lbs but gained back to 229  Alternates pillows with FF mask pressure ok, mask ok, some fatigue persists.      Review of Systems neg for any significant sore throat, dysphagia, itching, sneezing, nasal congestion or excess/ purulent secretions, fever, chills, sweats, unintended wt loss, pleuritic or exertional cp, hempoptysis, orthopnea pnd or change in chronic leg swelling. Also denies presyncope, palpitations, heartburn, abdominal pain, nausea, vomiting, diarrhea or change in bowel or urinary habits, dysuria,hematuria, rash, arthralgias, visual complaints, headache, numbness weakness or ataxia.     Objective:   Physical  Exam  Gen. Pleasant, obese, in no distress ENT - no lesions, no post nasal drip Neck: No JVD, no thyromegaly, no carotid bruits Lungs: no use of accessory muscles, no dullness to percussion, decreased without rales or rhonchi  Cardiovascular: Rhythm regular, heart sounds  normal, no murmurs or gallops, no peripheral edema Musculoskeletal: No deformities, no cyanosis or clubbing , no tremors       Assessment & Plan:

## 2013-09-27 ENCOUNTER — Encounter: Payer: Self-pay | Admitting: Neurology

## 2013-09-27 ENCOUNTER — Ambulatory Visit (INDEPENDENT_AMBULATORY_CARE_PROVIDER_SITE_OTHER): Payer: BC Managed Care – PPO | Admitting: Neurology

## 2013-09-27 VITALS — BP 126/62 | HR 90 | Ht 63.5 in | Wt 229.0 lb

## 2013-09-27 DIAGNOSIS — IMO0002 Reserved for concepts with insufficient information to code with codable children: Secondary | ICD-10-CM

## 2013-09-27 DIAGNOSIS — M792 Neuralgia and neuritis, unspecified: Secondary | ICD-10-CM | POA: Insufficient documentation

## 2013-09-27 LAB — VITAMIN B12: VITAMIN B 12: 472 pg/mL (ref 211–911)

## 2013-09-27 LAB — HEMOGLOBIN A1C
HEMOGLOBIN A1C: 6 % — AB (ref <5.7)
Mean Plasma Glucose: 126 mg/dL — ABNORMAL HIGH (ref <117)

## 2013-09-27 MED ORDER — LIDOCAINE 5 % EX OINT: 1.0000 "application " | TOPICAL_OINTMENT | Freq: Three times a day (TID) | CUTANEOUS | Status: DC | PRN

## 2013-09-27 MED ORDER — GABAPENTIN 300 MG PO CAPS: ORAL_CAPSULE | ORAL | Status: DC

## 2013-09-27 NOTE — Progress Notes (Signed)
NEUROLOGY CONSULTATION NOTE  Sabrina [name] MRN: [id number] DOB: [birth year]  Referring provider: Dr. Shawnie Dapper Primary care provider: Dr. Shawnie Dapper  Reason for consult:  Burning in both feet  Dear Dr Anitra Lauth:  Thank you for your kind referral of [name] for consultation of the above symptoms. Although her history is well known to you, please allow me to reiterate it for the purpose of our medical record. Records and images were personally reviewed where available.  HISTORY OF PRESENT ILLNESS: This is a pleasant 53 year old right-handed woman with a history of hypertension, GERD, OSA on CPAP, obesity, fibromyalgia, presenting for evaluation of burning pain in both feet that she reports started in 2010 after she had surgery for plantar fasciitis.  Initially, she felt that there was a bar in the arch of her foot, however over the past 6 months, this has spread to the middle of her sole up to her toes with numbness, burning, and pins and needles that were pretty constant until she started Neurontin last month.  She denies any worsening of pain when walking, she can feel the same symptoms after waking up in the morning.  She has tried different over the counter creams and compression socks which provide some relief.  Sometimes stepping on the cold floor helps, however she feels that her feet are cold and "cannot warm up."  She has muscle cramps in both calves frequently.  She denies any involvement of the hands or face.  She has back pain and had steroid injections last week.  She denies any weakness.  She started Neurontin 300mg  TID last month and states that this has taken the edge off, and burning pain is not as constant, however the numbness remains.  There is a family history of neuropathy in her mother and maternal aunt.  She has a history of migraines and takes Topamax 25mg  daily, with migraine attacks 1-2 times a month.  She takes oxycodone and Cymbalta for  fibromyalgia, and takes the oxycodone for migraine attacks.  She has been taking Nuvigil for a couple of years for daytime drowsiness from sleep apnea, but has been instructed to reduce this.  She denies any diplopia, dysarthria, dysphagia, bowel/bladder dysfunction. She has dry mouth, denies dry eyes. She reports she lost her voice for 3 months, attributed to GERD and allergies.  Laboratory Data: Component     Latest Ref Rng 06/19/2013  Sodium     135 - 145 mEq/L 134 (L)  Potassium     3.5 - 5.3 mEq/L 4.2  Chloride     96 - 112 mEq/L 101  CO2     19 - 32 mEq/L 23  Glucose     70 - 99 mg/dL 99  BUN     6 - 23 mg/dL 15  Creatinine     0.50 - 1.10 mg/dL 0.78  Total Bilirubin     0.2 - 1.2 mg/dL 0.5  Alkaline Phosphatase     39 - 117 U/L 98  AST     0 - 37 U/L 15  ALT     0 - 35 U/L 12  Total Protein     6.0 - 8.3 g/dL 7.1  Albumin     3.5 - 5.2 g/dL 4.6  Calcium     8.4 - 10.5 mg/dL 9.4  WBC     4.0 - 10.5 K/uL 12.4 (H)  RBC     3.87 - 5.11 MIL/uL 5.56 (H)  Hemoglobin  12.0 - 15.0 g/dL 15.1 (H)  HCT     36.0 - 46.0 % 43.8  MCV     78.0 - 100.0 fL 78.8  MCH     26.0 - 34.0 pg 27.2  MCHC     30.0 - 36.0 g/dL 34.5  RDW     11.5 - 15.5 % 14.9  Platelets     150 - 400 K/uL 397  B burgdorferi Ab IgG+IgM      0.05  ANA     NEGATIVE NEG  Rheumatoid Factor     <=14 IU/mL <10  TSH     0.350 - 4.500 uIU/mL 0.479  Sed Rate     0 - 22 mm/hr 8  CK Total     7 - 177 U/L 59    PAST MEDICAL HISTORY: Past Medical History  Diagnosis Date  . GERD (gastroesophageal reflux disease)   . Hypertension   . Allergy     rhinitis  . History of cervical cancer     Dr. Irven [place] (carcinoma in situ): Laser and cone bx 1993, margins neg, paps wnl since.  . Hyperlipidemia     NMR 03/2009.Marland KitchenLDL 161(2735/1887)HDL 37,TG 271  . Subclinical hyperthyroidism 06/19/11    TSH .29 at GYN (T4 and T3 wnl).  Repeat 07/21/11 wnl  . Sleep apnea     Dr. Elsworth Soho: CPAP 10 cm H2O with full  facemask as per titration study 03/21/12  . Chronic fatigue     +excessive daytime somnolence  . Fibromyalgia syndrome   . Dysfunctional uterine bleeding     Metromenorrhagia+dysmenorrhea  . Obesity   . History of wrist fracture     left  . Disturbance of smell and taste 2013    ENT eval (Dr. Bosie Clos) 10/2011 --recommended flonase, afrin, mucinex, saline nasal rinse and then do intracranial imaging if none of that helped in 2 mo.  [name] METABOLIC SYNDROME X 0/12/6759    Qualifier: Diagnosis of  By: Linna Darner MD, Gwyndolyn Saxon   Elevated trigs, HTN, elevated waist circumference.   . Migraine syndrome     topiramate has helped immensely  . Abdominal pain 03/11/2012    PAST SURGICAL HISTORY: Past Surgical History  Procedure Laterality Date  . Sinus surgery for septal deviation  1999    & polyps  . Wrist surgery      left  . Knee arthroscopy  04/1988    x2,open procedure for hamstring tendon injury)  . Microdiscectomy lumbar  09/2003    L5/S1 left microdiscectomy (Dr. Patrice Paradise)  . Plantar fascia release  02/2009    Bilateral (Dr. Shellia Carwin)  . Combined hysteroscopy diagnostic / d&c  03/2008    Done for thickened posterior endometrium found on w/u for menorrhagia.  Pathology-benign.  . Anal sphincterotomy  2007    for deep anal fissure (Dr. Brantley Stage)  . Hemorrhoid surgery  2006    Prolapsed internal hemorrhoids (Dr. Brantley Stage)  . 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 albuterol (VENTOLIN HFA) 108 (90 BASE) MCG/ACT inhaler Inhale 2 puffs into the lungs every 4 (four) hours as needed for wheezing or shortness of breath.  1 Inhaler  1  . Armodafinil (NUVIGIL) 150 MG tablet Take 1 tablet (150 mg total) by mouth daily.  30 tablet  1  . CAMILA 0.35 MG tablet       . celecoxib (CELEBREX) 200  MG capsule Take 1 capsule (200 mg total) by mouth daily.  30 capsule  6  .  Cholecalciferol (VITAMIN D3) 5000 UNITS TABS Take by mouth.      . clindamycin (CLEOCIN) 300 MG capsule Take 1 capsule (300 mg total) by mouth 3 (three) times daily.  30 capsule  0  . clonazePAM (KLONOPIN) 1 MG tablet TAKE ONE TABLET BY MOUTH TWICE DAILY.  60 tablet  5  . DULoxetine (CYMBALTA) 30 MG capsule Take 90 mg by mouth daily.      . fish oil-omega-3 fatty acids 1000 MG capsule Take 2 g by mouth daily.      . fluticasone (FLONASE) 50 MCG/ACT nasal spray Place 2 sprays into the nose daily.       [name] losartan-hydrochlorothiazide (HYZAAR) 100-25 MG per tablet Take 1 tablet by mouth daily.      . methocarbamol (ROBAXIN) 500 MG tablet TAKE 1/2 TABLET DURING THE DAY AND 1 TABLET AT NIGHT  45 tablet  5  . omeprazole (PRILOSEC) 20 MG capsule TAKE ONE CAPSULE BY MOUTH DAILY  90 capsule  3  . Oxycodone HCl 10 MG TABS 1-2 tabs po q6h prn pain  60 tablet  0  . topiramate (TOPIRAGEN) 25 MG tablet Take 25 mg by mouth daily.        . [DISCONTINUED] lisdexamfetamine (VYVANSE) 30 MG capsule 1 cap po qAM x 7d, then open capsule and take 1/2 of contents qd x 6d  10 capsule  0  . [DISCONTINUED] venlafaxine (EFFEXOR-XR) 150 MG 24 hr capsule Take 150 mg by mouth daily.         No current facility-administered medications on file prior to visit.    ALLERGIES: Allergies  Allergen Reactions  . Hydrocodone-Acetaminophen     Pt states she has itching after taking large quantities.  OK prn.    FAMILY HISTORY: Family History  Problem Relation Age of Onset  . Emphysema Mother   . Heart disease Mother 38    MI  . Emphysema Father   . Heart disease Father 34    MI  . Cancer Maternal Grandmother     BREAST  . Heart disease Maternal Grandmother     MI  . Heart disease Maternal Grandfather     MI  . Cancer Paternal Grandmother     STOMACH  . Cancer Paternal Grandfather     ? INTRA ABDOMINAL  . Heart disease Paternal Grandfather     MI    SOCIAL HISTORY: History   Social History  . Marital  Status: Divorced    Spouse Name: N/A    Number of Children: N/A  . Years of Education: N/A   Occupational History  . 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 Smoking status: Never Smoker   . Smokeless tobacco: Never Used  . Alcohol Use: No  . Drug Use: No  . Sexual Activity: Not on file   Other Topics Concern  . Not on file   Social History Narrative   Divorced, LIVES WITH 1 SON.   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  No T/A/Ds.   [place]    REVIEW OF SYSTEMS: Constitutional: No fevers, chills, or sweats, +generalized fatigue, no change in appetite Eyes: No visual changes, double vision, eye pain Ear, nose and throat: No hearing loss, ear pain, nasal congestion, sore throat Cardiovascular:  No chest pain, palpitations Respiratory:  No shortness of breath at rest or with exertion, wheezes GastrointestinaI: No nausea, vomiting, diarrhea, abdominal pain, fecal incontinence Genitourinary:  No dysuria, urinary retention or frequency Musculoskeletal:  + neck pain, back pain Integumentary: No rash, pruritus, skin lesions Neurological: as above Psychiatric: No depression, insomnia, anxiety Endocrine: No palpitations, fatigue, diaphoresis, mood swings, change in appetite, change in weight, increased thirst Hematologic/Lymphatic:  No anemia, purpura, petechiae. Allergic/Immunologic: no itchy/runny eyes, nasal congestion, recent allergic reactions, rashes  PHYSICAL EXAM: Filed Vitals:   09/27/13 0753  BP: 126/62  Pulse: 90   General: No acute distress Head:  Normocephalic/atraumatic Eyes: Fundoscopic exam shows bilateral sharp discs, no vessel changes, exudates, or hemorrhages Neck: supple, no paraspinal tenderness, full range of motion Back: No paraspinal tenderness Heart: regular rate and rhythm Lungs: Clear to auscultation bilaterally. Vascular: No carotid  bruits. Skin/Extremities: No rash, no edema, intact dorsalis pedis pulses bilaterally Neurological Exam: Mental status: alert and oriented to person, place, and time, no dysarthria or aphasia, Fund of knowledge is appropriate.  Recent and remote memory are intact.  Attention and concentration are normal.    Able to name objects and repeat phrases. Cranial nerves: CN I: not tested CN II: pupils equal, round and reactive to light, visual fields intact, fundi unremarkable. CN III, IV, VI:  full range of motion, no nystagmus, no ptosis CN V: facial sensation intact CN VII: upper and lower face symmetric CN VIII: hearing intact to finger rub CN IX, X: gag intact, uvula midline CN XI: sternocleidomastoid and trapezius muscles intact CN XII: tongue midline Bulk & Tone: normal, no fasciculations. Motor: 5/5 throughout with no pronator drift. Sensation: decreased cold sensation on the right foot, decreased pin and vibration on the left foot up to ankle, intact joint position sense, intact to all modalities on both UE. Romberg test negative Deep Tendon Reflexes: +2 throughout including both ankle jerks, no ankle clonus, negative Hoffman's sign Plantar responses: downgoing bilaterally Cerebellar: no incoordination on finger to nose Gait: narrow-based and steady, able to tandem walk adequately. Tremor: none  IMPRESSION: This is a 53 year old right-handed woman with a history of hypertension, GERD, OSA on CPAP, obesity, fibromyalgia, presenting with numbness, burning, and pins and needles sensation in both feet, clinically suggestive of neuropathic pain.  Her exam shows patchy decreased sensation in a stocking distribution, reflexes are intact.  Bloodwork for treatable causes of neuropathy will be ordered, as well as an EMG/NCV to further evaluate her symptoms.  She is concerned that she will be "popping Neurontin like candy" like her mother, and is hesitant about medication, however we discussed  symptomatic treatment and risks and benefits of treatment.  She will slowly uptitrate Neurontin to 600mg  TID, this can be further uptitrated as tolerated. Side effects were discussed.  She will try lidocaine ointment for her feet.  She will follow-up in 3 months.  Thank you for allowing me to participate in the care of this patient. Please do not hesitate to call for any questions or concerns.   Ellouise Newer, M.D.

## 2013-09-27 NOTE — Patient Instructions (Signed)
1. Bloodwork for HbA1c, vitamin B12, SPEP/UPEP with IFE, SS-A, SS-B 2. EMG/NCV of the lower extremities 3. Neurontin 300mg : Increase by 1 capsule every week, until you are on 2 caps three times a day 4. Start lidocaine cream, apply to feet as needed up to three times a day

## 2013-09-28 LAB — SJOGREN'S SYNDROME ANTIBODS(SSA + SSB)
SSA (RO) (ENA) ANTIBODY, IGG: NEGATIVE
SSB (LA) (ENA) ANTIBODY, IGG: NEGATIVE

## 2013-09-29 ENCOUNTER — Encounter: Payer: Self-pay | Admitting: Family Medicine

## 2013-09-29 LAB — UIFE/LIGHT CHAINS/TP QN, 24-HR UR
Albumin, U: DETECTED
Alpha 1, Urine: DETECTED — AB
Alpha 2, Urine: DETECTED — AB
BETA UR: DETECTED — AB
FREE KAPPA LT CHAINS, UR: 2.7 mg/dL — AB (ref 0.14–2.42)
FREE KAPPA/LAMBDA RATIO: 7.94 ratio (ref 2.04–10.37)
FREE LAMBDA LT CHAINS, UR: 0.34 mg/dL (ref 0.02–0.67)
Gamma Globulin, Urine: DETECTED — AB
TOTAL PROTEIN, URINE-UPE24: 3.7 mg/dL

## 2013-09-29 LAB — PROTEIN ELECTROPHORESIS, SERUM
Albumin ELP: 63.5 % (ref 55.8–66.1)
Alpha-1-Globulin: 3.5 % (ref 2.9–4.9)
Alpha-2-Globulin: 10.7 % (ref 7.1–11.8)
Beta 2: 5.3 % (ref 3.2–6.5)
Beta Globulin: 8 % — ABNORMAL HIGH (ref 4.7–7.2)
Gamma Globulin: 9 % — ABNORMAL LOW (ref 11.1–18.8)
TOTAL PROTEIN, SERUM ELECTROPHOR: 6 g/dL (ref 6.0–8.3)

## 2013-09-29 LAB — IMMUNOFIXATION ELECTROPHORESIS
IGM, SERUM: 48 mg/dL — AB (ref 52–322)
IgA: 196 mg/dL (ref 69–380)
IgG (Immunoglobin G), Serum: 644 mg/dL — ABNORMAL LOW (ref 690–1700)
Total Protein, Serum Electrophoresis: 6 g/dL (ref 6.0–8.3)

## 2013-10-09 ENCOUNTER — Telehealth: Payer: Self-pay | Admitting: Neurology

## 2013-10-09 NOTE — Telephone Encounter (Signed)
Pt is returning your call about the blood work [phone]

## 2013-10-24 ENCOUNTER — Other Ambulatory Visit: Payer: Self-pay | Admitting: Family Medicine

## 2013-10-24 NOTE — Telephone Encounter (Signed)
Rx rf request for celebrex. Patient last seen 09/11/13.  Last Rx was 03/06/13 x 6 rfs.  Please advise.

## 2013-11-05 ENCOUNTER — Other Ambulatory Visit: Payer: Self-pay | Admitting: Family Medicine

## 2013-11-08 ENCOUNTER — Ambulatory Visit (INDEPENDENT_AMBULATORY_CARE_PROVIDER_SITE_OTHER): Payer: BC Managed Care – PPO | Admitting: Neurology

## 2013-11-08 DIAGNOSIS — IMO0002 Reserved for concepts with insufficient information to code with codable children: Secondary | ICD-10-CM

## 2013-11-08 DIAGNOSIS — M792 Neuralgia and neuritis, unspecified: Secondary | ICD-10-CM

## 2013-11-08 DIAGNOSIS — M5417 Radiculopathy, lumbosacral region: Secondary | ICD-10-CM

## 2013-11-08 NOTE — Procedures (Signed)
[facility]  [address], [place] [address]  [place], [place] [address] Tel: [phone] Fax:  [phone] Test Date:  11/08/2013  Patient: Sabrina [name] DOB: Aug 02, 1960 Physician: Narda Amber, DO  Sex: Female Height: 5\' 3"  Ref Phys: Ellouise Newer  ID#: [id number] Temp: 33.8C Technician:    Patient Complaints: This is a 53 year-old female presenting cramps and bilateral burning pain/numbness of the feet.  NCV & EMG Findings: Extensive electrodiagnostic testing of the right lower extremity and additional studies of the left reveals:  1. Sural and superficial peroneal sensory responses are normal bilaterally.  2. Peroneal and tibial motor responses are normal bilaterally.  3. H reflexes are within normal limits.  4. Chronic motor axon loss changes are seen affecting the right L5-S1 myotomes and left L5 myotome. There is no associated active denervation.  Impression: 1. Right S1 radiculopathy, mild in degree electrically. 2. Bilateral L5 radiculopathy, very mild in degree electrically. 3. There is no evidence of a generalized sensorimotor polyneuropathy affecting bilateral lower extremities. However, a small fiber neuropathy cannot be excluded by this study.   ___________________________ Narda Amber, DO    Nerve Conduction Studies Anti Sensory Summary Table   Site NR Peak (ms) Norm Peak (ms) P-T Amp (V) Norm P-T Amp  Left Sup Peroneal Anti Sensory (Ant Lat Mall)  12 cm    3.0 <4.6 11.6 >4  Right Sup Peroneal Anti Sensory (Ant Lat Mall)  12 cm    2.3 <4.6 8.6 >4  Left Sural Anti Sensory (Lat Mall)  Calf    3.9 <4.6 9.6 >4  Right Sural Anti Sensory (Lat Mall)  Calf    3.8 <4.6 6.3 >4   Motor Summary Table   Site NR Onset (ms) Norm Onset (ms) O-P Amp (mV) Norm O-P Amp Site1 Site2 Delta-0 (ms) Dist (cm) Vel (m/s) Norm Vel (m/s)  Left Peroneal Motor (Ext Dig Brev)  Ankle    3.2 <6.0 5.4 >2.5 B Fib Ankle 7.6 36.0 47 >40  B Fib    10.8  5.0  Poplt B Fib 1.5 10.0  67 >40  Poplt    12.3  4.8         Right Peroneal Motor (Ext Dig Brev)  Ankle    3.4 <6.0 5.5 >2.5 B Fib Ankle 6.5 32.0 49 >40  B Fib    9.9  5.5  Poplt B Fib 1.6 10.0 63 >40  Poplt    11.5  5.2         Left Tibial Motor (Abd Hall Brev)  Ankle    4.9 <6.0 12.2 >4 Knee Ankle 7.0 38.0 54 >40  Knee    11.9  9.2         Right Tibial Motor (Abd Hall Brev)  Ankle    3.8 <6.0 12.8 >4 Knee Ankle 7.5 37.0 49 >40  Knee    11.3  8.9          H Reflex Studies   NR H-Lat (ms) Lat Norm (ms) L-R H-Lat (ms)  Left Tibial (Gastroc)     33.74 <35 0.00  Right Tibial (Gastroc)     33.74 <35 0.00   EMG   Side Muscle Ins Act Fibs Psw Fasc Number Recrt Dur Dur. Amp Amp. Poly Poly. Comment  Left AntTibialis Nml Nml Nml Nml 1- Mod-R Few 1+ Nml Nml Nml Nml N/A  Right Gastroc Nml Nml Nml Nml 1- Mod-R Few 1+ Nml Nml Nml Nml N/A  Left Flex Dig Long  Nml Nml Nml Nml 1- Mod-R Few 1+ Nml Nml Nml Nml N/A  Right Flex Dig Long Nml Nml Nml Nml 1- Mod-R Few 1+ Nml Nml Nml Nml N/A  Right GluteusMed Nml Nml Nml Nml Nml Nml Nml Nml Nml Nml Nml Nml N/A  Left GluteusMed Nml Nml Nml Nml Nml Nml Nml Nml Nml Nml Nml Nml N/A  Left Gastroc Nml Nml Nml Nml Nml Nml Nml Nml Nml Nml Nml Nml N/A  Left BicepsFemS Nml Nml Nml Nml Nml Nml Nml Nml Nml Nml Nml Nml N/A  Right RectFemoris Nml Nml Nml Nml Nml Nml Nml Nml Nml Nml Nml Nml N/A  Right BicepsFemS Nml Nml Nml Nml 1- Mod-R Some 1+ Some 1+ Few 1+ N/A  Right AntTibialis Nml Nml Nml Nml 1- Mod-R Some 1+ Few 1+ Nml Nml N/A      Waveforms:

## 2013-11-09 ENCOUNTER — Telehealth: Payer: Self-pay | Admitting: Neurology

## 2013-11-09 NOTE — Telephone Encounter (Signed)
Pt returning call to [facility].Please call back at [phone] / Sherri S.

## 2013-11-09 NOTE — Telephone Encounter (Signed)
Message copied by Thurmon Fair on Thu Nov 09, 2013  3:48 PM ------      Message from: Cameron Sprang      Created: Wed Nov 08, 2013  5:56 PM       Pls let her know EMG showed very mild irritation of nerves in the lower back, would continue with gabapentin as discussed and f/u as scheduled. Thanks ------

## 2013-11-09 NOTE — Telephone Encounter (Signed)
Patient returned my call. Notified of EMG results.

## 2013-11-20 ENCOUNTER — Ambulatory Visit (INDEPENDENT_AMBULATORY_CARE_PROVIDER_SITE_OTHER): Payer: BC Managed Care – PPO | Admitting: Family Medicine

## 2013-11-20 ENCOUNTER — Encounter: Payer: Self-pay | Admitting: Family Medicine

## 2013-11-20 NOTE — Progress Notes (Signed)
Pre visit review using our clinic review tool, if applicable. No additional management support is needed unless otherwise documented below in the visit note. 

## 2013-11-20 NOTE — Progress Notes (Signed)
OFFICE NOTE  11/20/2013  CC:  Chief Complaint  Patient presents with  . Weight Gain     HPI: Patient is a 53 y.o. Caucasian female who is here for discussion of obesity/desire to lose wt. Her BMI is 41.2 kg/m2. She has tried wt watchers, optifast, adkins, "some sort of doctor-prescribed shots" in the past. She typically stays on these diets 3-6 months and loses wt but eventually gains the weight back.  She does not want to pursue bariatric surgery.  She is frustrated. She got some steroid injections in her back with Dr. Ellene Route and associates 10-12 lb wt gain with each shot soon after. She doesn't think she ate more or her appetite was up after this.  She is unable to exercise at all due to her various pain conditions: LB musculo pain, fibromyalgia, feet peripheral neuropathy.  She takes 1/2 of an oxycodone tab most days lately.  See med list for other meds used (non-narcotic) used to treat her pain conditions--she is compliant with all of these.  She cannot afford integrative therapies in [place].  Pertinent PMH:  Past surgical, social, and family history reviewed and no changes noted since last office visit.  MEDS:  Outpatient Prescriptions Prior to Visit  Medication Sig Dispense Refill  . albuterol (VENTOLIN HFA) 108 (90 BASE) MCG/ACT inhaler Inhale 2 puffs into the lungs every 4 (four) hours as needed for wheezing or shortness of breath.  1 Inhaler  1  . Armodafinil (NUVIGIL) 150 MG tablet Take 1 tablet (150 mg total) by mouth daily.  30 tablet  1  . BIOTIN PO Take by mouth.      [name] CAMILA 0.35 MG tablet       . CELEBREX 200 MG capsule TAKE 1 CAPSULE (200 MG TOTAL) BY MOUTH DAILY.  30 capsule  6  . Cholecalciferol (VITAMIN D3) 5000 UNITS TABS Take by mouth.      . clonazePAM (KLONOPIN) 1 MG tablet TAKE ONE TABLET BY MOUTH TWICE DAILY.  60 tablet  5  . DULoxetine (CYMBALTA) 30 MG capsule Take 90 mg by mouth daily.      . fluticasone (FLONASE) 50 MCG/ACT nasal spray Place 2 sprays  into the nose daily.       [name] gabapentin (NEURONTIN) 300 MG capsule Take 2 caps three times a day  180 capsule  6  . lidocaine (XYLOCAINE) 5 % ointment Apply 1 application topically 3 (three) times daily as needed.  35.44 g  0  . losartan-hydrochlorothiazide (HYZAAR) 100-25 MG per tablet TAKE 1 TABLET BY MOUTH DAILY.  30 tablet  2  . methocarbamol (ROBAXIN) 500 MG tablet TAKE 1/2 TABLET DURING THE DAY AND 1 TABLET AT NIGHT  45 tablet  5  . omeprazole (PRILOSEC) 20 MG capsule TAKE ONE CAPSULE BY MOUTH DAILY  90 capsule  3  . Oxycodone HCl 10 MG TABS 1-2 tabs po q6h prn pain  60 tablet  0  . topiramate (TOPIRAGEN) 25 MG tablet Take 25 mg by mouth daily.        . clindamycin (CLEOCIN) 300 MG capsule Take 1 capsule (300 mg total) by mouth 3 (three) times daily.  30 capsule  0  . losartan-hydrochlorothiazide (HYZAAR) 100-25 MG per tablet Take 1 tablet by mouth daily.      . fish oil-omega-3 fatty acids 1000 MG capsule Take 2 g by mouth daily.      [name] MAGNESIUM PO Take by mouth.       No facility-administered medications  prior to visit.    PE: Blood pressure 131/85, pulse 99, temperature 98.1 F (36.7 C), temperature source Temporal, resp. rate 18, height 5\' 4"  (1.626 m), weight 240 lb (108.863 kg), last menstrual period 06/12/2011, SpO2 99.00%. Gen: Alert, well appearing.  Patient is oriented to person, place, time, and situation. AFFECT: pleasant, lucid thought and speech. No further exam today  IMPRESSION AND PLAN:  Obesity: nutritionist referral. I recommended she contemplate the possibility of taking an extra 1/2 to 1 oxycodone tab every other day (on days she tries to exercise) b/c she notes this med helps a lot, and this may help her start to see some results and feel better.  She has a lot of fear of oxycodone, though, and is hesitant to try this.  No new rx' s given today.  Spent 20 min with pt today, with >50% of this time spent in counseling and care coordination regarding the above  problems.  An After Visit Summary was printed and given to the patient.  FOLLOW UP: 27mo, f/u obesity

## 2013-12-06 ENCOUNTER — Encounter: Payer: Self-pay | Admitting: Dietician

## 2013-12-06 ENCOUNTER — Encounter: Payer: BC Managed Care – PPO | Attending: Family Medicine | Admitting: Dietician

## 2013-12-06 DIAGNOSIS — Z713 Dietary counseling and surveillance: Secondary | ICD-10-CM | POA: Diagnosis present

## 2013-12-06 DIAGNOSIS — Z6841 Body Mass Index (BMI) 40.0 and over, adult: Secondary | ICD-10-CM | POA: Diagnosis not present

## 2013-12-06 NOTE — Progress Notes (Signed)
  Medical Nutrition Therapy:  Appt start time: 1130 end time:  1230.   Assessment:  Primary concerns today: Obesity. Wants to lose weight to improve physical ability, sleep, blood pressure, to be healthy and be able to be a part of son's lives.  Weight history - emotional eating and lots of reasons to emotionally eat. Optifast - [employer] - lost a lot of weight, Atkins, [place], shot for weight loss-did not help, weight watchers, 6 week diet plan. Mother put a large emphasis on weight growing up and brothers also give her a hard time about not being active.  Patient reports history of bulimia nervosa in late teens, early 20's. Patient currently participates in binge type behaviors in the evenings without purging.  A referral was given to patient to see a therapist regarding emotional eating and binge type behaviors.  Patient currently does not experience hunger or fullness cues - just a headache in late afternoon when she skips lunch  Preferred Learning Style:   No preference indicated   Learning Readiness:   Ready   MEDICATIONS: See Chart   DIETARY INTAKE:  Usual eating pattern includes 2-3 meals and 1 snacks per day.   Avoided foods include: seafood, eggs.    24-hr recall:  B (9-11 in summer, 8-10 AM): might skip on the weekends Coffee - stevia and creamer-coconut, activia peach light yogurt mixed with nature's path cereal, sometimes peanut butter on multigrain bread and a banana on top - skim milk  Snk ( AM): nothing - water or caffeine free diet mountain dew - or flavored water  L ( PM): skips a lot - if busy doesn't get hungry. Ham and tomato sandwich, chick fil a - cobb salad - never uses the whole pack - sometimes sweet tea, or water with lemon or diet coke Snk ( PM): nothing D ( PM): pinto beans and cabbage, squash and onions, tomato. OR rice with chicken and broccoli, green beans. Rottisserie chicken with green beans - water/flavored water Snk ( PM):  doughnuts, crackers, etc etc. Beverages: water, flavored water, sweet tea, diet soda  Usual physical activity: currently inactive  Estimated energy needs: 1600 calories 180 g carbohydrates 120 g protein 44 g fat  Progress Towards Goal(s):  No progress.   Nutritional Diagnosis:  [place]-3.3 Overweight/obesity As related to emotional eating.  As evidenced by BMI greater than 30.    Intervention:  Nutrition education and Counseling.  Patient needs therapy for binge type behaviors. We discussed the importance of not skipping meals and consuming more energy at the first half of the day to nutritionally support not overeating at night.  Plan:  1) Make sure not to skip meals - aim to go no more than 5 hours without eating. 2) Consider setting an alarm to remember to eat lunch  -2-3 slices of ham, [place] or chicken wrapped around cheese, with whole grain crackers and fruit  -Sandwich with a piece of fruit and/or vegetable like carrots 3) Pair protein like meat, cheese, nuts or nut butter, with carbohydrate like fruit or grains at meals and snacks 4) Increase physical activity - Look into joining a gym with a pool  Teaching Method Utilized: Auditory  Handouts given during visit include:  Barriers to learning/adherence to lifestyle change: pain, history of dieting, bulimia nervosa  Demonstrated degree of understanding via:  Teach Back   Monitoring/Evaluation:  Dietary intake, exercise, and body weight in 2 week(s).

## 2013-12-06 NOTE — Patient Instructions (Signed)
Plan:  1) Make sure not to skip meals - aim to go no more than 5 hours without eating. 2) Consider setting an alarm to remember to eat lunch  -2-3 slices of ham, [place] or chicken wrapped around cheese, with whole grain crackers and fruit  -Sandwich with a piece of fruit and/or vegetable like carrots 3) Pair protein like meat, cheese, nuts or nut butter, with carbohydrate like fruit or grains at meals and snacks 4) Increase physical activity - Look into joining a gym with a pool

## 2013-12-11 ENCOUNTER — Encounter: Payer: Self-pay | Admitting: Family Medicine

## 2013-12-11 ENCOUNTER — Telehealth: Payer: Self-pay | Admitting: Family Medicine

## 2013-12-11 NOTE — Telephone Encounter (Signed)
Patient Information:  Caller Name: [name]  Phone: [phone]  Patient: Sabrina, [name]  Gender: Female  DOB: Sep 18, 1960  Age: 53 Years  PCP: Ricardo Jericho [facility])  Pregnant: No  Office Follow Up:  Does the office need to follow up with this patient?: No  Instructions For The Office: N/A  RN Note:  Lower Back pain, radiating down Left Leg, onset 2 weeks.  Pt is having hard time walking d/t pain, walks w/ a limp.  All emergent sxs ruled out per Back pain protocol, see now d/t severe back pain.  Pt has an emergency, must take her Mother to ED, unable to come to office today.  Request appt for 8-11, scheduled w/ Dr Anitra Lauth at [facility] on 8-11.  Symptoms  Reason For Call & Symptoms: Lower Back pain, radiating down Left Leg, onset 2 weeks.  Reviewed Health History In EMR: Yes  Reviewed Medications In EMR: Yes  Reviewed Allergies In EMR: Yes  Reviewed Surgeries / Procedures: Yes  Date of Onset of Symptoms: 11/27/2013  Treatments Tried: Muscle strengthes  Treatments Tried Worked: No OB / GYN:  LMP: Unknown  Guideline(s) Used:  Back Pain  Disposition Per Guideline:   Go to Office Now  Reason For Disposition Reached:   Severe back pain  Advice Given:  N/A  Patient Will Follow Care Advice:  YES  Appointment Scheduled:  12/12/2013 10:00:00 Appointment Scheduled Provider:  Ricardo Jericho (Family Practice)

## 2013-12-11 NOTE — Telephone Encounter (Signed)
FYI

## 2013-12-12 ENCOUNTER — Ambulatory Visit: Payer: Self-pay | Admitting: Family Medicine

## 2013-12-13 ENCOUNTER — Ambulatory Visit (INDEPENDENT_AMBULATORY_CARE_PROVIDER_SITE_OTHER): Payer: BC Managed Care – PPO | Admitting: Family Medicine

## 2013-12-13 ENCOUNTER — Encounter: Payer: Self-pay | Admitting: Family Medicine

## 2013-12-13 VITALS — BP 160/85 | HR 118 | Temp 97.6°F | Resp 20 | Ht 64.0 in | Wt 237.0 lb

## 2013-12-13 DIAGNOSIS — M5432 Sciatica, left side: Secondary | ICD-10-CM

## 2013-12-13 DIAGNOSIS — M5430 Sciatica, unspecified side: Secondary | ICD-10-CM

## 2013-12-13 MED ORDER — PREDNISONE 20 MG PO TABS: ORAL_TABLET | ORAL | Status: DC

## 2013-12-13 NOTE — Progress Notes (Signed)
Pre visit review using our clinic review tool, if applicable. No additional management support is needed unless otherwise documented below in the visit note. 

## 2013-12-13 NOTE — Patient Instructions (Signed)
Don't take your celebrex while you are taking prednisone.  Call if not improved in 5d and we'll refer you to physical therapy.

## 2013-12-13 NOTE — Progress Notes (Signed)
OFFICE NOTE  12/13/2013  CC:  Chief Complaint  Patient presents with  . Back Pain  . Leg Pain    left left     HPI: Patient is a 53 y.o. Caucasian female who is here for back pain.   Has chronic LBP.  Recently (approx 1 mo ago) started hurting worse in left glut down to back of knee.  Worse when gets up to walk or stretch leg.  Getting worse, occurring every morning the last couple weeks, tends to lessen as the day goes on. No recent injury/overuse of back or hips.  No new paresthesias-she has chronic bilat feet/toes tingling. No loss of bowel/bladder control.  No saddle anesthesia.   Pertinent PMH:  Past Medical History  Diagnosis Date  . GERD (gastroesophageal reflux disease)   . Hypertension   . Allergy     rhinitis  . History of cervical cancer     Dr. Irven Baltimore (carcinoma in situ): Laser and cone bx 1993, margins neg, paps wnl since.  . Hyperlipidemia     NMR 03/2009.Marland KitchenLDL 161(2735/1887)HDL 37,TG 271  . Subclinical hyperthyroidism 06/19/11    TSH .29 at GYN (T4 and T3 wnl).  Repeat 07/21/11 wnl  . Sleep apnea     Dr. Elsworth Soho: CPAP 10 cm H2O with full facemask as per titration study 03/21/12  . Chronic fatigue     +excessive daytime somnolence  . Fibromyalgia syndrome   . Dysfunctional uterine bleeding     Metromenorrhagia+dysmenorrhea  . Obesity   . History of wrist fracture     left  . Disturbance of smell and taste 2013    ENT eval (Dr. Bosie Clos) 10/2011 --recommended flonase, afrin, mucinex, saline nasal rinse and then do intracranial imaging if none of that helped in 2 mo.  [name] METABOLIC SYNDROME X 1/74/0814    Qualifier: Diagnosis of  By: Linna Darner MD, Gwyndolyn Saxon   Elevated trigs, HTN, elevated waist circumference.   . Migraine syndrome     topiramate has helped immensely  . Abdominal pain 03/11/2012  . Peripheral neuropathy     Burning/numbness bottoms of feet-saw neurologist, Dr. Delice Lesch, 09/2013.  [name] Chronic low back pain     08/2013 L spine MRI showed L2-3 DDD  and L5-S1 DDD w/out foraminal/nerve encroachment--Dr. Ellene Route did ESI and pt states this was not helpful and also she says they caused her to gain wt.   Past Surgical History  Procedure Laterality Date  . Sinus surgery for septal deviation  1999    & polyps  . Wrist surgery      left  . Knee arthroscopy  04/1988    x2,open procedure for hamstring tendon injury)  . Microdiscectomy lumbar  09/2003    L5/S1 left microdiscectomy (Dr. Patrice Paradise)  . Plantar fascia release  02/2009    Bilateral (Dr. Shellia Carwin)  . Combined hysteroscopy diagnostic / d&c  03/2008    Done for thickened posterior endometrium found on w/u for menorrhagia.  Pathology-benign.  . Anal sphincterotomy  2007    for deep anal fissure (Dr. Brantley Stage)  . Hemorrhoid surgery  2006    Prolapsed internal hemorrhoids (Dr. Brantley Stage)  . 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 albuterol (VENTOLIN HFA) 108 (90 BASE) MCG/ACT inhaler Inhale 2 puffs into the lungs every 4 (four) hours as needed for wheezing or shortness of breath.  1 Inhaler  1  .  Armodafinil (NUVIGIL) 150 MG tablet Take 1 tablet (150 mg total) by mouth daily.  30 tablet  1  . BIOTIN PO Take by mouth.      [name] CAMILA 0.35 MG tablet       . CELEBREX 200 MG capsule TAKE 1 CAPSULE (200 MG TOTAL) BY MOUTH DAILY.  30 capsule  6  . Cholecalciferol (VITAMIN D3) 5000 UNITS TABS Take by mouth.      . clonazePAM (KLONOPIN) 1 MG tablet TAKE ONE TABLET BY MOUTH TWICE DAILY.  60 tablet  5  . DULoxetine (CYMBALTA) 30 MG capsule Take 90 mg by mouth daily.      . fish oil-omega-3 fatty acids 1000 MG capsule Take 2 g by mouth daily.      . fluticasone (FLONASE) 50 MCG/ACT nasal spray Place 2 sprays into the nose daily.       [name] gabapentin (NEURONTIN) 300 MG capsule Take 2 caps three times a day  180 capsule  6  . lidocaine (XYLOCAINE) 5 % ointment Apply 1 application topically 3 (three)  times daily as needed.  35.44 g  0  . losartan-hydrochlorothiazide (HYZAAR) 100-25 MG per tablet TAKE 1 TABLET BY MOUTH DAILY.  30 tablet  2  . MAGNESIUM PO Take by mouth.      . methocarbamol (ROBAXIN) 500 MG tablet TAKE 1/2 TABLET DURING THE DAY AND 1 TABLET AT NIGHT  45 tablet  5  . omeprazole (PRILOSEC) 20 MG capsule TAKE ONE CAPSULE BY MOUTH DAILY  90 capsule  3  . Oxycodone HCl 10 MG TABS 1-2 tabs po q6h prn pain  60 tablet  0  . topiramate (TOPIRAGEN) 25 MG tablet Take 25 mg by mouth daily.         No facility-administered medications prior to visit.    PE: Blood pressure 160/85, pulse 118, temperature 97.6 F (36.4 C), temperature source Temporal, resp. rate 20, height 5\' 4"  (1.626 m), weight 237 lb (107.502 kg), last menstrual period 06/12/2011, SpO2 98.00%. Gen: Alert, well appearing.  Patient is oriented to person, place, time, and situation. BACK: mild TTP over left LB/iliac crest region.  +TTP left ischial tuberosity. +Discomfort in back of knee and distal hamstring with left lower leg extension and passive sitting SLR on left.  She fatigues with left sided toe-raises after 3-4 of these, can heel walk ok.  Patellar and achilles DTRs 2+ bilat.   Left hamstring and pop fossa w/out tenderness or mass.  IMPRESSION AND PLAN:  Mild left sided sciatica. Prednisone 40mg  qd x 5d. She will continue all other meds, has oxycodone already to take prn--she always uses this very sparingly. Hold celebrex while on prednisone. If not improved with steroids and her home stretches in 5d, then she'll call and we'll arrange for PT. An After Visit Summary was printed and given to the patient.  FOLLOW UP: prn

## 2013-12-27 ENCOUNTER — Other Ambulatory Visit: Payer: Self-pay | Admitting: Family Medicine

## 2013-12-27 NOTE — Telephone Encounter (Signed)
Rf request for Robaxin.  Last OV was 12/13/13.  Last Rx was 07/17/13 x 5rfs.  Please advise.

## 2014-01-03 ENCOUNTER — Encounter: Payer: Self-pay | Admitting: Dietician

## 2014-01-03 ENCOUNTER — Encounter: Payer: BC Managed Care – PPO | Attending: Family Medicine | Admitting: Dietician

## 2014-01-03 DIAGNOSIS — Z713 Dietary counseling and surveillance: Secondary | ICD-10-CM | POA: Insufficient documentation

## 2014-01-03 DIAGNOSIS — Z6841 Body Mass Index (BMI) 40.0 and over, adult: Secondary | ICD-10-CM | POA: Diagnosis not present

## 2014-01-03 NOTE — Progress Notes (Signed)
  Medical Nutrition Therapy:  Appt start time: 1510 end time:  7425.  Assessment:  Primary concerns today: Obesity. Patient reports that she has been restricting intake less at breakfast and lunch and  that she has been more intentional about eating at those meals and as a result, she has seen a decrease in her overeating behavior in the evenings on days when she increased her intake in the first part of the day. Today she wanted to know more about what the components of what those meals looked like.   Patient does has a history of bulimia nervosa and refers to her overeating in the evening as a binge. This clinician referred patient to a therapist at our last encounter and patient says that she has not had time or money currently to schedule the appointment with the therapist. She still has the contact information for the therapist and plans on making an appointment. I reiterated to patient the importance of addressing the mental health aspects of her eating behaviors in concert with addressing the nutrition aspects, especially with her history of eating disorder. Patient also reports that mother and brother comment about her weight and inactivity and that this makes her feel bad.  Unsafe foods: chocolate, ice cream, pizza, hamburgers,   MEDICATIONS: see chart  DIETARY INTAKE:  24-hr recall:  B (AM): peanut butter on cinnamon raisin toast and a large banana or one slice of multigrain toast with 2 sausage patties.   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   Nutritional Diagnosis:  [place]-3.3 Overweight/obesity As related to eating pattern of  restricting during the day and overeating at night and history of eating disorder and emotional eating.  As evidenced by patient report and BMI greater than 30.    Intervention:  Nutrition education and counseling.  Discussed the importance of foods not being identified as good and bad. Discussed importance of a mental health therapist being part of the care team.  Plan:  Continue eating breakfast and lunch daily Include 2-3 servings of carbohydrate at breakfast, lunch and dinner Include carbohydrate and protein at all 3 meals  Handouts given during visit include:  My plate   Monitoring/Evaluation:  Dietary intake in 2 week(s).

## 2014-01-03 NOTE — Patient Instructions (Signed)
Plan:  Continue eating breakfast and lunch daily Include 2-3 servings of carbohydrate at breakfast, lunch and dinner Include carbohydrate and protein at all 3 meals

## 2014-01-05 ENCOUNTER — Ambulatory Visit: Payer: BC Managed Care – PPO | Admitting: Neurology

## 2014-01-17 ENCOUNTER — Ambulatory Visit: Payer: BC Managed Care – PPO | Admitting: Neurology

## 2014-01-18 ENCOUNTER — Telehealth: Payer: Self-pay | Admitting: Neurology

## 2014-01-18 NOTE — Telephone Encounter (Signed)
Pt no showed 01/17/14 appt w/ Dr. Delice Lesch.   Melissa Noon -please send no show letter to pt / Sherri S.

## 2014-01-19 ENCOUNTER — Encounter: Payer: Self-pay | Admitting: *Deleted

## 2014-01-19 NOTE — Progress Notes (Signed)
No show letter was sent for 01-17-2014

## 2014-01-29 ENCOUNTER — Ambulatory Visit (INDEPENDENT_AMBULATORY_CARE_PROVIDER_SITE_OTHER): Payer: BC Managed Care – PPO | Admitting: Family Medicine

## 2014-01-29 ENCOUNTER — Encounter: Payer: Self-pay | Admitting: Family Medicine

## 2014-01-29 VITALS — BP 148/92 | HR 102 | Temp 99.1°F | Resp 18 | Ht 64.0 in | Wt 244.0 lb

## 2014-01-29 DIAGNOSIS — IMO0001 Reserved for inherently not codable concepts without codable children: Secondary | ICD-10-CM

## 2014-01-29 DIAGNOSIS — R5382 Chronic fatigue, unspecified: Secondary | ICD-10-CM

## 2014-01-29 DIAGNOSIS — K5900 Constipation, unspecified: Secondary | ICD-10-CM

## 2014-01-29 DIAGNOSIS — R5381 Other malaise: Secondary | ICD-10-CM

## 2014-01-29 DIAGNOSIS — R350 Frequency of micturition: Secondary | ICD-10-CM

## 2014-01-29 DIAGNOSIS — M797 Fibromyalgia: Secondary | ICD-10-CM

## 2014-01-29 DIAGNOSIS — R5383 Other fatigue: Secondary | ICD-10-CM

## 2014-01-29 NOTE — Progress Notes (Signed)
OFFICE NOTE  01/29/2014  CC:  Chief Complaint  Patient presents with  . Dysuria   HPI: Patient is a 53 y.o. Caucasian female who is here for frequent urination.  No burning with urination.  Back has been hurting worse than usual lately, has resorted to taking oxycodone 1/2 tab bid-tid, also takes celebrex 200mg  qd.  Constipated all last week and Milk of mag resulted in incomplete evacuation after taking it twice.  The BM she did have with MOM was "like diarrhea".  She reports that, as usual, "every single joint in my body hurts".  Gets out of breath walking easier than before.  No abdominal pain.  Appetite is down.  No significant nausea.    Her psychiatrist is weening her off cymbalta and getting her on pristiq to see if this would help b/c she has been feeling so bad lately.  She feels like her depressed mood has been secondary to her problems just struggling to function with chronic fatigue and pain.  NO fevers.  NO joint pain or swelling.  Pertinent PMH:  Past Medical History  Diagnosis Date  . GERD (gastroesophageal reflux disease)   . Hypertension   . Allergy     rhinitis  . History of cervical cancer     Dr. Irven Baltimore (carcinoma in situ): Laser and cone bx 1993, margins neg, paps wnl since.  . Hyperlipidemia     NMR 03/2009.Marland KitchenLDL 161(2735/1887)HDL 37,TG 271  . Subclinical hyperthyroidism 06/19/11    TSH .29 at GYN (T4 and T3 wnl).  Repeat 07/21/11 wnl  . Sleep apnea     Dr. Elsworth Soho: CPAP 10 cm H2O with full facemask as per titration study 03/21/12  . Chronic fatigue     +excessive daytime somnolence  . Fibromyalgia syndrome   . Dysfunctional uterine bleeding     Metromenorrhagia+dysmenorrhea  . Obesity   . History of wrist fracture     left  . Disturbance of smell and taste 2013    ENT eval (Dr. Bosie Clos) 10/2011 --recommended flonase, afrin, mucinex, saline nasal rinse and then do intracranial imaging if none of that helped in 2 mo.  [name] METABOLIC SYNDROME X 08/10/8117    Qualifier: Diagnosis of  By: Linna Darner MD, Gwyndolyn Saxon   Elevated trigs, HTN, elevated waist circumference.   . Migraine syndrome     topiramate has helped immensely  . Abdominal pain 03/11/2012  . Peripheral neuropathy     Burning/numbness bottoms of feet-saw neurologist, Dr. Delice Lesch, 09/2013.  [name] Chronic low back pain     08/2013 L spine MRI showed L2-3 DDD and L5-S1 DDD w/out foraminal/nerve encroachment--Dr. Ellene Route did ESI and pt states this was not helpful and also she says they caused her to gain wt.   Past Surgical History  Procedure Laterality Date  . Sinus surgery for septal deviation  1999    & polyps  . Wrist surgery      left  . Knee arthroscopy  04/1988    x2,open procedure for hamstring tendon injury)  . Microdiscectomy lumbar  09/2003    L5/S1 left microdiscectomy (Dr. Patrice Paradise)  . Plantar fascia release  02/2009    Bilateral (Dr. Shellia Carwin)  . Combined hysteroscopy diagnostic / d&c  03/2008    Done for thickened posterior endometrium found on w/u for menorrhagia.  Pathology-benign.  . Anal sphincterotomy  2007    for deep anal fissure (Dr. Brantley Stage)  . Hemorrhoid surgery  2006    Prolapsed internal hemorrhoids (Dr. Brantley Stage)  . 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  Also, she is on 60mg  cymbalta qd and weening off this med currently Outpatient Prescriptions Prior to Visit  Medication Sig Dispense Refill  . Armodafinil (NUVIGIL) 150 MG tablet Take 1 tablet (150 mg total) by mouth daily.  30 tablet  1  . BIOTIN PO Take by mouth.      [name] CAMILA 0.35 MG tablet       . CELEBREX 200 MG capsule TAKE 1 CAPSULE (200 MG TOTAL) BY MOUTH DAILY.  30 capsule  6  . Cholecalciferol (VITAMIN D3) 5000 UNITS TABS Take by mouth.      . clonazePAM (KLONOPIN) 1 MG tablet TAKE ONE TABLET BY MOUTH TWICE DAILY.  60 tablet  5  . DULoxetine (CYMBALTA) 30 MG capsule Take 90 mg by mouth daily.      . fish oil-omega-3  fatty acids 1000 MG capsule Take 2 g by mouth daily.      . fluticasone (FLONASE) 50 MCG/ACT nasal spray Place 2 sprays into the nose daily.       [name] gabapentin (NEURONTIN) 300 MG capsule Take 2 caps three times a day  180 capsule  6  . lidocaine (XYLOCAINE) 5 % ointment Apply 1 application topically 3 (three) times daily as needed.  35.44 g  0  . losartan-hydrochlorothiazide (HYZAAR) 100-25 MG per tablet TAKE 1 TABLET BY MOUTH DAILY.  30 tablet  2  . methocarbamol (ROBAXIN) 500 MG tablet TAKE 1/2 TABLET DURING THE DAY AND 1 TABLET AT NIGHT.  45 tablet  5  . omeprazole (PRILOSEC) 20 MG capsule TAKE ONE CAPSULE BY MOUTH DAILY  90 capsule  3  . Oxycodone HCl 10 MG TABS 1-2 tabs po q6h prn pain  60 tablet  0  . topiramate (TOPIRAGEN) 25 MG tablet Take 25 mg by mouth daily.        [name] albuterol (VENTOLIN HFA) 108 (90 BASE) MCG/ACT inhaler Inhale 2 puffs into the lungs every 4 (four) hours as needed for wheezing or shortness of breath.  1 Inhaler  1  . MAGNESIUM PO Take by mouth.      . predniSONE (DELTASONE) 20 MG tablet 2 tabs po qd x 5d  10 tablet  0   No facility-administered medications prior to visit.    PE: Blood pressure 148/92, pulse 102, temperature 99.1 F (37.3 C), temperature source Temporal, resp. rate 18, height 5\' 4"  (1.626 m), weight 244 lb (110.678 kg), last menstrual period 06/12/2011, SpO2 96.00%. Gen: Alert, well appearing.  Patient is oriented to person, place, time, and situation. AFFECT: appears worried, tired ENT: no icterus ABD: soft, nondistended.  Mild TTP diffusely in lower abdomen.  No mass or HSM.  BS hypoactive.   EXT: 1+ pitting edema in both ankles.  No clubbing or cyanosis  LAB: CC UA today was completely normal  IMPRESSION AND PLAN:  1) Urinary frequency secondary to severe constipation/full colon: reassured pt that no UTI was present.    2) Constipation, acute--secondary to recent increased use of oxycodone for her chronic pain: instructions--Take OTC  Fleets enema up to 2 times this evening.  Starting tonight at bedtime, take OTC Senna plus (senakot S is brand name) 2 tabs and do this every night.  For now, start miralax (glycolax) OTC 1 capful mixed with 4-8 of water once a day and continue for 5 days.  If chronic narcotic pain  med use becomes her norm for this pain, then we may need to add amitiza.  3) Chronic fatigue syndrome and fibromyalgia: she is in the midst of a "flare" of this. I recommend she see a rheumatologist for expert evaluation and management and she was in agreement with this.  Will have her see Rheum dept at [facility]. No new meds rx'd today.  No new labs or imaging today.  An After Visit Summary was printed and given to the patient.  Spent 25 min with pt today, with >50% of this time spent in counseling and care coordination regarding the pt's constipation, urinary frequency, chronic fatigue, and fibromyalgia.  FOLLOW UP: prn

## 2014-01-29 NOTE — Patient Instructions (Signed)
Take OTC Fleets enema up to 2 times this evening.  Starting tonight at bedtime, take OTC Senna plus (senakot S is brand name) 2 tabs and do this every night.  For now, start miralax (glycolax) OTC 1 capful mixed with 4-8 of water once a day and continue for 5 days.

## 2014-01-29 NOTE — Progress Notes (Signed)
Pre visit review using our clinic review tool, if applicable. No additional management support is needed unless otherwise documented below in the visit note. 

## 2014-02-07 ENCOUNTER — Telehealth: Payer: Self-pay | Admitting: Family Medicine

## 2014-02-07 DIAGNOSIS — M545 Low back pain, unspecified: Secondary | ICD-10-CM

## 2014-02-07 DIAGNOSIS — M797 Fibromyalgia: Secondary | ICD-10-CM

## 2014-02-07 DIAGNOSIS — M5136 Other intervertebral disc degeneration, lumbar region: Secondary | ICD-10-CM

## 2014-02-07 DIAGNOSIS — M51369 Other intervertebral disc degeneration, lumbar region without mention of lumbar back pain or lower extremity pain: Secondary | ICD-10-CM

## 2014-02-07 DIAGNOSIS — G8929 Other chronic pain: Secondary | ICD-10-CM

## 2014-02-07 NOTE — Telephone Encounter (Signed)
Pls notify pt that [facility]umat[facility] reviewed her records and they recommended that she see a pain specialist. This is actually a good idea.  If she is in agreement, let me know and I'll do the order.  Ask if she has any specific clinic in mind or does she want me to choose? --thx

## 2014-02-07 NOTE — Telephone Encounter (Signed)
LMOM for pt to CB.  

## 2014-02-12 MED ORDER — OXYCODONE HCL 10 MG PO TABS: ORAL_TABLET | ORAL | Status: DC

## 2014-02-12 NOTE — Telephone Encounter (Signed)
Oxycodone rx printed. 

## 2014-02-12 NOTE — Telephone Encounter (Signed)
Pt is requesting a refill on her Oxycodone. I see previous message. Please advise.

## 2014-02-13 NOTE — Telephone Encounter (Signed)
Patient aware of Rx at front desk.  She is considering pain management but she isn't quite sure yet.  I told her to let us know what she decided when she picked up her RX.

## 2014-02-14 NOTE — Telephone Encounter (Signed)
OK. Referral to [facility] (Dr. Vira Blanco) ordered.

## 2014-02-14 NOTE — Telephone Encounter (Signed)
Patient okay to with Pain Management referral. She would prefer a [facility] provider.

## 2014-02-16 ENCOUNTER — Encounter: Payer: Self-pay | Admitting: Nurse Practitioner

## 2014-02-16 ENCOUNTER — Other Ambulatory Visit: Payer: Self-pay | Admitting: Nurse Practitioner

## 2014-02-16 ENCOUNTER — Ambulatory Visit (INDEPENDENT_AMBULATORY_CARE_PROVIDER_SITE_OTHER): Payer: BC Managed Care – PPO | Admitting: Nurse Practitioner

## 2014-02-16 ENCOUNTER — Encounter: Payer: Self-pay | Admitting: *Deleted

## 2014-02-16 VITALS — BP 110/78 | HR 80 | Temp 97.5°F | Ht 64.0 in | Wt 248.0 lb

## 2014-02-16 DIAGNOSIS — M791 Myalgia: Secondary | ICD-10-CM

## 2014-02-16 DIAGNOSIS — R0602 Shortness of breath: Secondary | ICD-10-CM

## 2014-02-16 DIAGNOSIS — R609 Edema, unspecified: Secondary | ICD-10-CM

## 2014-02-16 DIAGNOSIS — IMO0001 Reserved for inherently not codable concepts without codable children: Secondary | ICD-10-CM

## 2014-02-16 DIAGNOSIS — M5440 Lumbago with sciatica, unspecified side: Secondary | ICD-10-CM

## 2014-02-16 DIAGNOSIS — M609 Myositis, unspecified: Secondary | ICD-10-CM

## 2014-02-16 LAB — CBC
HEMATOCRIT: 35.4 % — AB (ref 36.0–46.0)
Hemoglobin: 11.2 g/dL — ABNORMAL LOW (ref 12.0–15.0)
MCH: 24.2 pg — ABNORMAL LOW (ref 26.0–34.0)
MCHC: 31.6 g/dL (ref 30.0–36.0)
MCV: 76.5 fL — ABNORMAL LOW (ref 78.0–100.0)
Platelets: 343 10*3/uL (ref 150–400)
RBC: 4.63 MIL/uL (ref 3.87–5.11)
RDW: 16.1 % — AB (ref 11.5–15.5)
WBC: 6.7 10*3/uL (ref 4.0–10.5)

## 2014-02-16 MED ORDER — ALBUTEROL SULFATE HFA 108 (90 BASE) MCG/ACT IN AERS: 2.0000 | INHALATION_SPRAY | RESPIRATORY_TRACT | Status: DC | PRN

## 2014-02-16 MED ORDER — PREDNISONE 10 MG PO TABS: ORAL_TABLET | ORAL | Status: DC

## 2014-02-16 MED ORDER — FUROSEMIDE 20 MG PO TABS: 20.0000 mg | ORAL_TABLET | Freq: Two times a day (BID) | ORAL | Status: DC

## 2014-02-16 MED ORDER — METHYLPREDNISOLONE ACETATE 40 MG/ML IJ SUSP
40.0000 mg | Freq: Once | INTRAMUSCULAR | Status: AC
  Administered 2014-02-16: 40 mg via INTRA_ARTICULAR

## 2014-02-16 NOTE — Patient Instructions (Addendum)
Start furosemide today. Take 1 dose today, start 2 doses tomorrow-1 in am & 1 in afternoon. 2 doses on Sunday & Monday, then stop.  Start prednisone tomorrow. Take early in day so it doesn't keep you awake.  Use good body mechanics when bending & twisting. Start back stretches on Sunday. See below. Don't force any movement that is painful.  Use heat on back 3 times daily.  Walk for 10 minutes 3 times day until I see you again.  Cut diet sodas in half every 3 days until you stop them.  See handout & start implementing today for best nutrition & weight loss.  Start probiotic daily-Align or Culturelle.  Stop senakot if you are having diarrhea.  See me on Monday.  Back Exercises Back exercises help treat and prevent back injuries. The goal of back exercises is to increase the strength of your abdominal and back muscles and the flexibility of your back. These exercises should be started when you no longer have back pain. Back exercises include:  Pelvic Tilt. Lie on your back with your knees bent. Tilt your pelvis until the lower part of your back is against the floor. Hold this position 5 to 10 sec and repeat 5 to 10 times.  Knee to Chest. Pull first 1 knee up against your chest and hold for 20 to 30 seconds, repeat this with the other knee, and then both knees. This may be done with the other leg straight or bent, whichever feels better.  Sit-Ups or Curl-Ups. Bend your knees 90 degrees. Start with tilting your pelvis, and do a partial, slow sit-up, lifting your trunk only 30 to 45 degrees off the floor. Take at least 2 to 3 seconds for each sit-up. Do not do sit-ups with your knees out straight. If partial sit-ups are difficult, simply do the above but with only tightening your abdominal muscles and holding it as directed.  Hip-Lift. Lie on your back with your knees flexed 90 degrees. Push down with your feet and shoulders as you raise your hips a couple inches off the floor; hold for 10  seconds, repeat 5 to 10 times.  Shoulder-Lifts. Lie face down with arms beside your body. Keep hips and torso pressed to floor as you slowly lift your head and shoulders off the floor. Do not overdo your exercises, especially in the beginning. Exercises may cause you some mild back discomfort which lasts for a few minutes; however, if the pain is more severe, or lasts for more than 15 minutes, do not continue exercises until you see your caregiver. Improvement with exercise therapy for back problems is slow.  See your caregivers for assistance with developing a proper back exercise program. Document Released: 05/28/2004 Document Revised: 07/13/2011 Document Reviewed: 02/19/2011 [facility] Patient Information 2015 [place]. This information is not intended to replace advice given to you by your health care provider. Make sure you discuss any questions you have with your health care provider.

## 2014-02-16 NOTE — Progress Notes (Signed)
Pre visit review using our clinic review tool, if applicable. No additional management support is needed unless otherwise documented below in the visit note. 

## 2014-02-17 LAB — COMPREHENSIVE METABOLIC PANEL
ALBUMIN: 3.9 g/dL (ref 3.5–5.2)
ALT: 30 U/L (ref 0–35)
AST: 33 U/L (ref 0–37)
Alkaline Phosphatase: 73 U/L (ref 39–117)
BUN: 16 mg/dL (ref 6–23)
CHLORIDE: 107 meq/L (ref 96–112)
CO2: 25 mEq/L (ref 19–32)
Calcium: 8.9 mg/dL (ref 8.4–10.5)
Creat: 0.73 mg/dL (ref 0.50–1.10)
Glucose, Bld: 95 mg/dL (ref 70–99)
POTASSIUM: 4.5 meq/L (ref 3.5–5.3)
SODIUM: 139 meq/L (ref 135–145)
TOTAL PROTEIN: 6.1 g/dL (ref 6.0–8.3)
Total Bilirubin: 0.3 mg/dL (ref 0.2–1.2)

## 2014-02-17 LAB — TSH: TSH: 0.537 u[IU]/mL (ref 0.350–4.500)

## 2014-02-17 LAB — URINALYSIS, MICROSCOPIC ONLY
Bacteria, UA: NONE SEEN
Casts: NONE SEEN
Crystals: NONE SEEN

## 2014-02-17 LAB — THYROID PEROXIDASE ANTIBODY

## 2014-02-17 LAB — T4, FREE: Free T4: 0.79 ng/dL — ABNORMAL LOW (ref 0.80–1.80)

## 2014-02-17 LAB — C3 AND C4
C3 Complement: 148 mg/dL (ref 90–180)
C4 COMPLEMENT: 32 mg/dL (ref 10–40)

## 2014-02-17 LAB — BRAIN NATRIURETIC PEPTIDE: BRAIN NATRIURETIC PEPTIDE: 28.4 pg/mL (ref 0.0–100.0)

## 2014-02-17 LAB — SEDIMENTATION RATE: Sed Rate: 11 mm/hr (ref 0–22)

## 2014-02-19 ENCOUNTER — Ambulatory Visit (INDEPENDENT_AMBULATORY_CARE_PROVIDER_SITE_OTHER): Payer: BC Managed Care – PPO | Admitting: Nurse Practitioner

## 2014-02-19 ENCOUNTER — Encounter: Payer: Self-pay | Admitting: *Deleted

## 2014-02-19 ENCOUNTER — Encounter: Payer: Self-pay | Admitting: Nurse Practitioner

## 2014-02-19 VITALS — BP 133/90 | HR 98 | Temp 99.3°F | Ht 64.0 in | Wt 238.0 lb

## 2014-02-19 DIAGNOSIS — E038 Other specified hypothyroidism: Secondary | ICD-10-CM

## 2014-02-19 DIAGNOSIS — R609 Edema, unspecified: Secondary | ICD-10-CM

## 2014-02-19 DIAGNOSIS — E8881 Metabolic syndrome: Secondary | ICD-10-CM

## 2014-02-19 DIAGNOSIS — M5440 Lumbago with sciatica, unspecified side: Secondary | ICD-10-CM

## 2014-02-19 DIAGNOSIS — D649 Anemia, unspecified: Secondary | ICD-10-CM

## 2014-02-19 DIAGNOSIS — E039 Hypothyroidism, unspecified: Secondary | ICD-10-CM

## 2014-02-19 DIAGNOSIS — R499 Unspecified voice and resonance disorder: Secondary | ICD-10-CM

## 2014-02-19 LAB — CARDIOLIPIN ANTIBODIES, IGM+IGG
ANTICARDIOLIPIN IGM: 5 [MPL'U]/mL (ref <11)
Anticardiolipin IgG: 3 GPL U/mL (ref <23)

## 2014-02-19 LAB — ANTINUCLEAR ANTIBODIES, IFA: ANA TITER 1: NEGATIVE

## 2014-02-19 LAB — BETA-2 GLYCOPROTEIN ANTIBODIES
BETA-2-GLYCOPROTEIN I IGM: 15 M Units (ref <20)
Beta-2 Glyco I IgG: 0 G Units (ref <20)
Beta-2-Glycoprotein I IgA: 14 A Units (ref <20)

## 2014-02-19 LAB — RNP ANTIBODY: RIBONUCLEIC PROTEIN(ENA) ANTIBODY, IGG: NEGATIVE

## 2014-02-19 NOTE — Patient Instructions (Signed)
Continue furosemide once daily until weight does not change more than 2 lb. Weigh at same time every day.  Do back stretches 3 times daily.  Walk for minimum of 10 minutes three times daily.  Start implementing food changes on handout. Use it as a guide. There will times you will stray from plan, but just get back on it for the next meal. Remember, the goal is not so much weight loss as it is, avoiding foods that trigger autoimmune disease, cancers, & chronic disease and choosing foods that are nutrient dense-foods that can prevent & reverse some illnesses. The weight will come off-usually 2 pounds per week.  Continue prednisone. Return in 2 weeks to re-evaluate.  When you return, I will check iron panel and possibly other labs.  Nice to see you!

## 2014-02-19 NOTE — Progress Notes (Signed)
Pre visit review using our clinic review tool, if applicable. No additional management support is needed unless otherwise documented below in the visit note. 

## 2014-02-19 NOTE — Progress Notes (Signed)
Subjective:     Sabrina [name] is a 53 y.o. female returns for follow up of SOB w/activity, peripheral edema & back pain. She was seen in office 3 days ago & had significant peripheral edema in LE & hands. I wonder if she has CREST or other mixed connective tissue syndrome. Screening labs for sjogren's, scleroderma, & SLE were performed. Lasix was started twice daily. Today her weight reflects 10 lb. Weight loss. Still waiting for complete lab results. Complement 3 &4 nml, sed rate nml.  Back pain was described as lumbar w/ radiation to L hip. I gave her 40 mg depo-medrol & started oral prednisone. She isn't sure if back pain is better. She states radiating pain has changed to inner L thigh. She is not stretching back as instructed. Will finish prednisone course & re-evaluate in 2 weeks. Past CXRy showed mild scoliosis. Discussed need for weight loss.  She complains of SOB w/showering & walking-no improvement even w/10 lb weight loss in 3 days. Interstitial lung? Should screen for Jo1 ab. Asthma? "Elevated diaphragm" in history. Last CXR 2013 showed mild bronchial changes. CBC shows mild anemia. Will order iron studies w/next lab draw. Thyroid studies still showing low free T4, nml TSH. Today she has new complaint of voice change. Voice sounds quivery, like spasmodic dysphonia. No associated symptoms. Pt states same symptom occurred in past-lasted about 3 mos after PPI dose was doubled. Low grade fever today.    The following portions of the patient's history were reviewed and updated as appropriate: allergies, current medications, past family history, past medical history, past social history, past surgical history and problem list.  Review of Systems Pertinent items are noted in HPI.    Objective:    BP 133/90  Pulse 98  Temp(Src) 99.3 F (37.4 C) (Oral)  Ht 5\' 4"  (1.626 m)  Wt 238 lb (107.956 kg)  BMI 40.83 kg/m2  SpO2 94%  LMP 06/12/2011 BP 133/90  Pulse 98  Temp(Src) 99.3 F  (37.4 C) (Oral)  Ht 5\' 4"  (1.626 m)  Wt 238 lb (107.956 kg)  BMI 40.83 kg/m2  SpO2 94%  LMP 06/12/2011 General appearance: alert, cooperative, appears stated age and no distress Head: Normocephalic, without obvious abnormality, atraumatic Eyes: negative findings: lids and lashes normal and conjunctivae and sclerae normal Back: range of motion normal, no spinal tenderness Lungs: clear to auscultation bilaterally Heart: regular rate and rhythm, S1, S2 normal, no murmur, click, rub or gallop Extremities: edema much improved, still has mild pitting at ankles bilat, hands mildy swollen but also much improved. Pulses: 2+ and symmetric    Assessment:  1. Subclinical hypothyroidism  2. Voice disorder  3. Peripheral edema  4. METABOLIC SYNDROME X  5. Low back pain with sciatica, sciatica laterality unspecified, unspecified back pain laterality  6. Anemia, unspecified anemia type  See problem list for complete A&P See pt instructions. F/u 2 weeks

## 2014-02-20 DIAGNOSIS — R609 Edema, unspecified: Secondary | ICD-10-CM | POA: Insufficient documentation

## 2014-02-20 DIAGNOSIS — E038 Other specified hypothyroidism: Secondary | ICD-10-CM | POA: Insufficient documentation

## 2014-02-20 DIAGNOSIS — M545 Low back pain, unspecified: Secondary | ICD-10-CM | POA: Insufficient documentation

## 2014-02-20 DIAGNOSIS — IMO0001 Reserved for inherently not codable concepts without codable children: Secondary | ICD-10-CM | POA: Insufficient documentation

## 2014-02-20 DIAGNOSIS — E039 Hypothyroidism, unspecified: Secondary | ICD-10-CM | POA: Insufficient documentation

## 2014-02-20 DIAGNOSIS — R0602 Shortness of breath: Secondary | ICD-10-CM | POA: Insufficient documentation

## 2014-02-20 DIAGNOSIS — R499 Unspecified voice and resonance disorder: Secondary | ICD-10-CM | POA: Insufficient documentation

## 2014-02-20 LAB — LUPUS ANTICOAGULANT
DPT CONFIRM RATIO: 1.01 ratio (ref 0.00–1.20)
DPT: 38.1 s (ref 0.0–55.0)
DRVVT: 32.7 s (ref 0.0–55.1)
PTT Lupus Anticoagulant: 37.9 s (ref 0.0–50.0)
Thrombin Time: 17.1 s (ref 0.0–20.0)

## 2014-02-20 NOTE — Assessment & Plan Note (Signed)
Pt states no better w/steroids. Will continue steroid course. Daily back stretches, heat. F/u 2 weeks.

## 2014-02-20 NOTE — Assessment & Plan Note (Signed)
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 Pain radiates to L knee & hip. Daily back stretches, daily walks Depo-medrol inofc today Short course oral prednisone

## 2014-02-20 NOTE — Assessment & Plan Note (Signed)
A1C 6.0 Discussed need for regular exercise & diet changes. HO given for best food choices & nutrition. A1C in 2 weeks at f/u appt.

## 2014-02-20 NOTE — Assessment & Plan Note (Signed)
Pt complains that her whole body hurts. 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 +2 pitting edema bilat LE feet to knees, hands tight & swollen-mixed connective tissue disorder? Will screen for CREST, MCTD Encourage celebrex, continue neurontin. Pt stopped cymbalta, started pristiq this week (Dr Clovis Pu).  Doesn't like to take oxycodone. Encourage 3, 10 minute walks daily.

## 2014-02-20 NOTE — Assessment & Plan Note (Signed)
BS clear, but significant peripheral edema today. Last CXR 2012-bronchial thickening, nml cardiac silhouette. Obesity likely contributor-seeing nutritionist. Hx of bulemia. Lasix trial. F/u 3 days.

## 2014-02-20 NOTE — Assessment & Plan Note (Signed)
Denies bloody or black stools Recent ua-no blood Will do iron panel with next blood draw & repeat CBC.

## 2014-02-20 NOTE — Assessment & Plan Note (Signed)
Pitting edema feet to knees bilat LE, hands tight & swollen C/o tingling sensation both hands. Lasix BID for 3 days. Continue losartan/HCTZ for now, but losartan may be contributing to peripheral edema. Heart failure? She is c/o increasing SOB. (Hx of elevated diaphragm) CREST or other MCTD? Screening labs today. F/u 3 days.

## 2014-02-20 NOTE — Progress Notes (Signed)
Subjective:     Sabrina [name] is a 53 y.o. female presents w/c/o "whole body hurts...especially back, L knee & hip...arms tingle...feel horrible...always out of breath..legs tight". Sabrina [name] is an established pt w/Dr McGowen. She is new to me. 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 She has Hx of fibromyalgia-this could be "fibro flare" due to added physical stress. Hx chronic back pain-states Dr Ellene Route gave her injections in back last summer-not helpful. Bending, twisting aggravate back pain. "moving" makes body hurt. Arms asleep all the time. "Nothing makes pain better": she admits that she forget she has celebrex-not using, doesn't like to take oxycodone (only 3 refills since January 2015, 60 T each. Most recent 02/14/14), stopped cymbalta this week, switched to pristiq, per psychiatry. She does take neurontin, which makes pain in feet better. Not exercising, obese, seeing nutritionist, Hx of bulemia. Pertinent fam HX: father RA, MGF RA.   The following portions of the patient's history were reviewed and updated as appropriate: allergies, current medications, past family history, past medical history, past social history, past surgical history and problem list.  Review of Systems Pertinent items are noted in HPI.    Objective:    BP 110/78  Pulse 80  Temp(Src) 97.5 F (36.4 C) (Temporal)  Ht 5\' 4"  (1.626 m)  Wt 248 lb (112.492 kg)  BMI 42.55 kg/m2  SpO2 95%  LMP 06/12/2011 BP 110/78  Pulse 80  Temp(Src) 97.5 F (36.4 C) (Temporal)  Ht 5\' 4"  (1.626 m)  Wt 248 lb (112.492 kg)  BMI 42.55 kg/m2  SpO2 95%  LMP 06/12/2011 General appearance: alert, cooperative, appears stated age, mild distress and morbidly obese Head: Normocephalic, without obvious abnormality, atraumatic Eyes: negative findings: lids and lashes normal and conjunctivae and sclerae normal Neck: acanthus nigracans Back: range of motion normal, tender over thoracic spine, extends into traps  bilat. Lungs: clear to auscultation bilaterally Heart: regular rate and rhythm, S1, S2 normal, no murmur, click, rub or gallop Extremities: edema +2 pitting edema feet to knees, hands tight, swollen, white over knuckles Pulses: 2+ and symmetric    Assessment:  1. Peripheral edema - Centromere Antibodies; Future - Antinuclear Antibodies, IFA - RNP Antibody - C3 and C4 - Cardiolipin antibodies, IgM+IgG - Lupus anticoagulant - Beta-2 glycoprotein antibodies - T4, free - TSH - Thyroid peroxidase antibody - CBC - Comprehensive metabolic panel - Sedimentation rate - Urine Microscopic - furosemide (LASIX) 20 MG tablet; Take 1 tablet (20 mg total) by mouth 2 (two) times daily.  Dispense: 30 tablet; Refill: 0 - B Nat Peptide  2. Myalgia and myositis - Centromere Antibodies; Future - Antinuclear Antibodies, IFA - RNP Antibody - C3 and C4 - Cardiolipin antibodies, IgM+IgG - Lupus anticoagulant - Beta-2 glycoprotein antibodies - T4, free - TSH - Thyroid peroxidase antibody - CBC - Comprehensive metabolic panel - Sedimentation rate - Urine Microscopic   3. SOB (shortness of breath) - albuterol (VENTOLIN HFA) 108 (90 BASE) MCG/ACT inhaler; Inhale 2 puffs into the lungs every 4 (four) hours as needed for wheezing or shortness of breath.  Dispense: 1 Inhaler; Refill: 1 - B Nat Peptide  4. Low back pain with sciatica, sciatica laterality unspecified, unspecified back pain laterality - predniSONE (DELTASONE) 10 MG tablet; Starting tomorrow, Take 3Tpo qam X 3d, then 2T po qam X 3d, then 1T po qd X 3d, then 1/2T po qam X 3d.  Dispense: 21 tablet; Refill: 0 - methylPREDNISolone acetate (DEPO-MEDROL) injection 40 mg; Inject 1 mL (40 mg total)  into the articular space once.  See problem list for complete A&P See pt instructions. F/u 3 d.

## 2014-02-20 NOTE — Assessment & Plan Note (Signed)
Started lasix 3d ago. 10 lb. Wt loss in 3 d. STill mild edema with pitting in ankles & mild tightness in hands, but much improved. States SOB no better. Continue lasix qd until weight not changing more than 2 lb in 24 hrs. Then PRN for weight gain 2 lb or more in 24 hrs. Lab screening for CREST or other MCTD incomplete.

## 2014-02-20 NOTE — Assessment & Plan Note (Signed)
Pt states had same symptoms in past, saw ENT, was scoped, determined reflux worse. PPI dose was doubled, symptoms resolved within 3 mos.

## 2014-02-23 ENCOUNTER — Telehealth: Payer: Self-pay | Admitting: Nurse Practitioner

## 2014-02-23 LAB — CENTROMERE ANTIBODIES: CENTROMERE AB SCREEN: NEGATIVE

## 2014-02-23 NOTE — Telephone Encounter (Signed)
pls call pt: Advise Good news: Labs for connective tissue disease neg, HOWEVER it is possible to a mixed connective tissue disorder with no pos labs. I will discuss with her further at follow up appt. Mild anemia & subclinical hypothyroidism. Will do iron studies at follow up.

## 2014-02-23 NOTE — Telephone Encounter (Signed)
Patient notified

## 2014-02-26 ENCOUNTER — Telehealth: Payer: Self-pay | Admitting: Family Medicine

## 2014-02-26 NOTE — Telephone Encounter (Signed)
Pt of Layne Weaver. Pt. Stopped taking Lasix on Wed. She has gained 8 lbs since then. Pt. States she is in severe pain and wants to know if it is ok to take the Lasix again today to relieve pain. She is making an appt to f/u with Layne for tomorrow.  Please ask Dr. Anitra Lauth if she is able to take her Lasix to help. [place]

## 2014-02-26 NOTE — Telephone Encounter (Signed)
Please advise.  Pt recently saw Layne.

## 2014-02-27 ENCOUNTER — Encounter: Payer: Self-pay | Admitting: Nurse Practitioner

## 2014-02-27 ENCOUNTER — Ambulatory Visit (INDEPENDENT_AMBULATORY_CARE_PROVIDER_SITE_OTHER): Payer: BC Managed Care – PPO | Admitting: Nurse Practitioner

## 2014-02-27 VITALS — BP 144/83 | HR 89 | Temp 97.6°F | Ht 64.0 in | Wt 246.0 lb

## 2014-02-27 DIAGNOSIS — R635 Abnormal weight gain: Secondary | ICD-10-CM

## 2014-02-27 DIAGNOSIS — R0602 Shortness of breath: Secondary | ICD-10-CM

## 2014-02-27 DIAGNOSIS — R9431 Abnormal electrocardiogram [ECG] [EKG]: Secondary | ICD-10-CM

## 2014-02-27 LAB — BASIC METABOLIC PANEL
BUN: 17 mg/dL (ref 6–23)
CHLORIDE: 105 meq/L (ref 96–112)
CO2: 25 mEq/L (ref 19–32)
Calcium: 9.2 mg/dL (ref 8.4–10.5)
Creatinine, Ser: 1.1 mg/dL (ref 0.4–1.2)
GFR: 58.12 mL/min — AB (ref >60.00)
Glucose, Bld: 92 mg/dL (ref 70–99)
POTASSIUM: 3.8 meq/L (ref 3.5–5.1)
Sodium: 136 mEq/L (ref 135–145)

## 2014-02-27 NOTE — Telephone Encounter (Signed)
Her pain will not get better with use of diuretic.  Sorry.

## 2014-02-27 NOTE — Progress Notes (Signed)
Pre visit review using our clinic review tool, if applicable. No additional management support is needed unless otherwise documented below in the visit note. 

## 2014-02-27 NOTE — Patient Instructions (Addendum)
Instructions for lasix are on-going: I'm increasing dose to 40 mg. Take 2 tabs daily until you stop losing weight (at least 1 pound in 24 hours). Then take daily when you have had an increase of 2 pounds or more in 24 hours. Do this until you are advised to stop by a health care provider.  Restrict salt to 2400 mg daily.  Increase water intake to 48 oz or more daily.  Walk 10 minutes or more if tolerated, 3 times daily.  Please see cardiology for evaluation of rapid weight gain & Shortness of breath with exertion.

## 2014-02-27 NOTE — Telephone Encounter (Signed)
Pt advised.

## 2014-03-01 ENCOUNTER — Encounter: Payer: Self-pay | Admitting: Cardiology

## 2014-03-01 ENCOUNTER — Telehealth: Payer: Self-pay | Admitting: Nurse Practitioner

## 2014-03-01 ENCOUNTER — Ambulatory Visit (INDEPENDENT_AMBULATORY_CARE_PROVIDER_SITE_OTHER): Payer: BC Managed Care – PPO | Admitting: Cardiology

## 2014-03-01 ENCOUNTER — Encounter: Payer: Self-pay | Admitting: *Deleted

## 2014-03-01 ENCOUNTER — Ambulatory Visit
Admission: RE | Admit: 2014-03-01 | Discharge: 2014-03-01 | Disposition: A | Discharge status: Home / Self Care | Payer: BC Managed Care – PPO | Source: Ambulatory Visit | Attending: Cardiology | Admitting: Cardiology

## 2014-03-01 VITALS — BP 112/72 | HR 87 | Ht 64.0 in | Wt 238.8 lb

## 2014-03-01 DIAGNOSIS — R0789 Other chest pain: Secondary | ICD-10-CM

## 2014-03-01 DIAGNOSIS — R0609 Other forms of dyspnea: Secondary | ICD-10-CM

## 2014-03-01 DIAGNOSIS — Z8249 Family history of ischemic heart disease and other diseases of the circulatory system: Secondary | ICD-10-CM

## 2014-03-01 DIAGNOSIS — D509 Iron deficiency anemia, unspecified: Secondary | ICD-10-CM

## 2014-03-01 DIAGNOSIS — R609 Edema, unspecified: Secondary | ICD-10-CM

## 2014-03-01 NOTE — Telephone Encounter (Signed)
Spoke with pt, advised labs normal. Pt understood.

## 2014-03-01 NOTE — Telephone Encounter (Signed)
pls call pt: Advise electrolytes nml.

## 2014-03-01 NOTE — Patient Instructions (Addendum)
Your physician recommends that you continue on your current medications as directed. Please refer to the Current Medication list given to you today.  Your physician has requested that you have an echocardiogram. Echocardiography is a painless test that uses sound waves to create images of your heart. It provides your doctor with information about the size and shape of your heart and how well your heart's chambers and valves are working. This procedure takes approximately one hour. There are no restrictions for this procedure.  Your physician has requested that you have a lexiscan myoview. For further information please visit [email or web address]. Please follow instruction sheet, as given.  A chest x-ray takes a picture of the organs and structures inside the chest, including the heart, lungs, and blood vessels. This test can show several things, including, whether the heart is enlarges; whether fluid is building up in the lungs; and whether pacemaker / defibrillator leads are still in place. [place]  FOLLOW UP AS NEEDED

## 2014-03-01 NOTE — Progress Notes (Signed)
Sabrina [name] Date of Birth:  Feb 27, 1961 [facility] [address] [place], [place]  [address] [phone]        Fax   [phone]   History of Present Illness: This pleasant 53 year old woman is seen by me for the first time today.  She is seen at the request of Dr. Shawnie Dapper.  She is seen because of symptoms of chest tightness and shortness of breath and fluid retention.  She states that she has had a long history of hypertension dating back possibly 20 years.  She has a history of borderline diabetes.  She does not have any history of known heart disease.  She does have a history of sleep apnea and fibromyalgia.  She has exogenous obesity.  She has a history of mild anemia. Recently she was seen by her PCP because of worsening symptoms of weight gain and fluid excess.  She was also experiencing shortness of breath and substernal chest pressure.  The chest pressure would be not related to exertion.  She has had some tingling of both arms also occurring at rest. She has had recent lab work at her PCP office.  In regard to her edema, her BNP was normal at 28 on 02/16/14.  Her hemoglobin was 11.2 and her sedimentation rate was 11. The patient had an electrocardiogram on 02/27/14 which was reviewed by me today and is within normal limits. The patient has never had an echocardiogram.  Her last chest x-ray was in 2012. Her family history is positive for ischemic heart disease.  Her mother is alive at age 49 and has ischemic heart disease.  Her father died at 69 after his second myocardial infarction. 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 The patient is recently divorced.  She has 2 boys aged 46 and 28. The patient is a nonsmoker.  Has never smoked.  Current Outpatient Prescriptions  Medication Sig Dispense Refill  . albuterol (VENTOLIN HFA) 108 (90 BASE) MCG/ACT inhaler Inhale 2 puffs into the lungs every  4 (four) hours as needed for wheezing or shortness of breath.  1 Inhaler  1  . CAMILA 0.35 MG tablet       . CELEBREX 200 MG capsule TAKE 1 CAPSULE (200 MG TOTAL) BY MOUTH DAILY.  30 capsule  6  . clonazePAM (KLONOPIN) 1 MG tablet TAKE ONE TABLET BY MOUTH TWICE DAILY.  60 tablet  5  . desvenlafaxine (PRISTIQ) 100 MG 24 hr tablet Take 100 mg by mouth daily.      . fluticasone (FLONASE) 50 MCG/ACT nasal spray Place 2 sprays into the nose daily.       . furosemide (LASIX) 40 MG tablet Take 40 mg by mouth.      . gabapentin (NEURONTIN) 300 MG capsule Take 2 caps three times a day  180 capsule  6  . lidocaine (XYLOCAINE) 5 % ointment Apply 1 application topically 3 (three) times daily as needed.  35.44 g  0  . losartan-hydrochlorothiazide (HYZAAR) 100-25 MG per tablet TAKE 1 TABLET BY MOUTH DAILY.  30 tablet  2  . methocarbamol (ROBAXIN) 500 MG tablet TAKE 1/2 TABLET DURING THE DAY AND 1 TABLET AT NIGHT.  45 tablet  5  . omeprazole (PRILOSEC) 20 MG capsule TAKE ONE CAPSULE BY MOUTH DAILY  90 capsule  3  . Oxycodone HCl 10 MG TABS 1-2 tabs po q6h prn pain  60 tablet  0  .  topiramate (TOPIRAGEN) 25 MG tablet Take 25 mg by mouth daily.        . Armodafinil (NUVIGIL) 150 MG tablet Take 1 tablet (150 mg total) by mouth daily.  30 tablet  1  . [DISCONTINUED] lisdexamfetamine (VYVANSE) 30 MG capsule 1 cap po qAM x 7d, then open capsule and take 1/2 of contents qd x 6d  10 capsule  0  . [DISCONTINUED] venlafaxine (EFFEXOR-XR) 150 MG 24 hr capsule Take 150 mg by mouth daily.         No current facility-administered medications for this visit.    Allergies  Allergen Reactions  . Hydrocodone-Acetaminophen     Pt states she has itching after taking large quantities.  OK prn.    Patient Active Problem List   Diagnosis Date Noted  . Lumbago 02/20/2014  . SOB (shortness of breath) 02/20/2014  . Myalgia and myositis 02/20/2014  . Peripheral edema 02/20/2014  . Subclinical hypothyroidism 02/20/2014  .  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 Neuropathic pain 09/27/2013  . Sinusitis, acute 09/11/2013  . Metatarsalgia 03/31/2013  . Health maintenance examination 03/31/2013  . Urine frequency 12/13/2012  . Non-healing skin lesion of nose 12/13/2012  . Watery eyes 06/16/2012  . Abdominal pain 03/11/2012  . Obesities, morbid 03/08/2012  . Subclinical hyperthyroidism 12/06/2011  . Weight gain, abnormal 11/11/2011  . 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 Fibromyalgia syndrome 08/05/2011  . Anxiety 07/11/2011  . ABDOMINAL PAIN, EPIGASTRIC 07/17/2010  . Anemia 12/31/2009  . VISUAL ACUITY, DECREASED 12/31/2009  . Other and unspecified hyperlipidemia 11/26/2008  . METABOLIC SYNDROME X 05/06/7251  . NONSPECIFIC ABNORM RESULTS THYROID FUNCT STUDY 11/26/2008  . ALLERGIC RHINITIS 08/29/2008  . ELEVATED DIAPHRAM 08/29/2008  . HYPERSOMNIA, ASSOCIATED WITH SLEEP APNEA 08/29/2008  . Other dyspnea and respiratory abnormality 08/14/2008  . HYPERTENSION, ESSENTIAL NOS 09/23/2007  . GERD 09/23/2007    History  Smoking status  . Never Smoker   Smokeless tobacco  . Never Used    History  Alcohol Use No    Family History  Problem Relation Age of Onset  . Emphysema Mother   . Heart disease Mother 50    MI  . Emphysema Father   . Heart disease Father 28    MI  . Cancer Maternal Grandmother     BREAST  . Heart disease Maternal Grandmother     MI  . Heart disease Maternal Grandfather     MI  . Cancer Paternal Grandmother     STOMACH  . Cancer Paternal Grandfather     ? INTRA ABDOMINAL  . Heart disease Paternal Grandfather     MI    Review of Systems: Constitutional: no fever chills diaphoresis.  Complains of fatigue and weight gain and inability to lose weight Head and neck: no hearing loss, no epistaxis, no photophobia or visual disturbance. Respiratory: No cough.  Positive for shortness of breath with exertion  Cardiovascular: Positive for substernal chest discomfort, peripheral edema and  dyspnea Gastrointestinal: No abdominal distention, no abdominal pain, no change in bowel habits hematochezia or melena. Genitourinary: No dysuria, no frequency, no urgency, no nocturia.  Last menstrual period several months ago.  Prior to that, heavy periods which could explain her iron deficiency anemia.   Musculoskeletal:No arthralgias, no back pain, no gait disturbance or myalgias.  Positive for fibromyalgia Neurological: No dizziness, no headaches, no numbness, no seizures, no syncope, no weakness, no tremors. Hematologic: No lymphadenopathy, no easy bruising. Psychiatric: No confusion, no hallucinations, no sleep disturbance.  Physical Exam: Filed Vitals:   03/01/14 0958  BP: 112/72  Pulse: 87  The patient appears to be in no distress.  Head and neck exam reveals that the pupils are equal and reactive.  The extraocular movements are full.  There is no scleral icterus.  Mouth and pharynx are benign.  No lymphadenopathy.  No carotid bruits.  The jugular venous pressure is normal.  Thyroid is not enlarged or tender.  Chest is clear to percussion and auscultation.  No rales or rhonchi.  Expansion of the chest is symmetrical.  Heart reveals no abnormal lift or heave.  First and second heart sounds are normal.  There is no murmur gallop rub or click.  The abdomen is soft and nontender.  Bowel sounds are normoactive.  There is no hepatosplenomegaly or mass.  There are no abdominal bruits.  Extremities reveal trace pretibial edema.  Pedal pulses are good.  There is no cyanosis or clubbing.  Neurologic exam is normal strength and no lateralizing weakness.  No sensory deficits.  Integument reveals no rash  EKG from 02/27/14 was reviewed and is within normal limits.  Assessment / Plan: 1. fluid retention by history although physical examination today shows only minimal pretibial edema.  Recent BNP was normal at 28. 2. chest discomfort with strong family history of ischemic heart  disease. 3. hypertensive heart disease 4. exogenous obesity 5. history of fibromyalgia 6. mild microcytic hypochromic anemia  Disposition: We will update her chest x-ray.  Her last chest x-ray in 2012 showed normal heart size. We will have her return for a 2-D echo to evaluate her dyspnea and for a Lexi scan Myoview stress test to evaluate her chest discomfort.  Many thanks for the opportunity to see this pleasant woman with you.  We will be in touch regarding the results of her studies.  Return here when necessary

## 2014-03-02 ENCOUNTER — Telehealth: Payer: Self-pay | Admitting: *Deleted

## 2014-03-02 ENCOUNTER — Other Ambulatory Visit: Payer: Self-pay | Admitting: Nurse Practitioner

## 2014-03-02 DIAGNOSIS — R609 Edema, unspecified: Secondary | ICD-10-CM

## 2014-03-02 MED ORDER — FUROSEMIDE 40 MG PO TABS: ORAL_TABLET | ORAL | Status: DC

## 2014-03-02 NOTE — Telephone Encounter (Signed)
Patient is requesting refill for Lasix 40 mg.

## 2014-03-03 DIAGNOSIS — R9431 Abnormal electrocardiogram [ECG] [EKG]: Secondary | ICD-10-CM | POA: Insufficient documentation

## 2014-03-03 DIAGNOSIS — R635 Abnormal weight gain: Secondary | ICD-10-CM | POA: Insufficient documentation

## 2014-03-03 DIAGNOSIS — R0602 Shortness of breath: Secondary | ICD-10-CM | POA: Insufficient documentation

## 2014-03-03 NOTE — Progress Notes (Signed)
Subjective:     Sabrina [name] is a 53 y.o. female presents for rapid weight gain, ongoing SOB, and Low back pain that radiates to R leg. [name] reports 8 lb weight gain over weekend and this is confirmed by office scale. C/o SOB is long-standing & described as decreases QOL-unable to attend son's football games because she cannot walk from car to stadium. Pt states SOB becomes worse when she has extremity swelling, although I have not been able to appreciate crackles or rales on lung auscultation. SHe was seen in office within last 2 weeks for same problem & instructed to take lasix, which was effective: 6 lb weight loss in 3 days at f/u ov. (Pt did not continue lasix as she did not realize my instructions were continuous.) We discussed importance of diet modification at last visit-she has cut back soda, but salt intake still too high. Thus far, connective tissue disease w/u is neg (see last OV note for specifics). Although she does not c/o chest pain, nausea, dizziness, I will perform ECG today and check kidney function & electrolytes. Pt has been evaluated for back pain by Dr Ellene Route who performed facet injections. I advised Ms Dome to contact DR Ellene Route regarding ongoing complaint. I gave her a trial of prednisone taper several weeks ago without relief.  The following portions of the patient's history were reviewed and updated as appropriate: allergies, current medications, past medical history, past social history, past surgical history and problem list.  Review of Systems Pertinent items are noted in HPI.    Objective:    BP 144/83  Pulse 89  Temp(Src) 97.6 F (36.4 C) (Temporal)  Ht 5\' 4"  (1.626 m)  Wt 246 lb (111.585 kg)  BMI 42.21 kg/m2  SpO2 100%  LMP 06/12/2011 BP 144/83  Pulse 89  Temp(Src) 97.6 F (36.4 C) (Temporal)  Ht 5\' 4"  (1.626 m)  Wt 246 lb (111.585 kg)  BMI 42.21 kg/m2  SpO2 100%  LMP 06/12/2011 General appearance: alert, cooperative, appears stated age  and no distress Head: Normocephalic, without obvious abnormality, atraumatic Eyes: negative findings: lids and lashes normal and conjunctivae and sclerae normal Lungs: clear to auscultation bilaterally Heart: regular rate and rhythm, S1, S2 normal, no murmur, click, rub or gallop Extremities: edema +2 pitting edema ankles to knees, mild edema hands Pulses: 2+ and symmetric    Assessment:   1. Rapid weight gain - EKG 12-Lead NSR, lead 2 looks like it has U wave. Will check potassium. - Ambulatory referral to Cardiology DD: too much salt/not enough water, kidney disease, heart disease Consider stopping HRT if w/u today & cardio nml.  2. SOB (shortness of breath) on exertion - Ambulatory referral to Cardiology Contributing factors: body habitus, Hx of elevated diaphragm  3. ECG abnormality - Basic metabolic panel  Resume lasix instructions. Restrict salt to 2400 mg/day. Increase water intake to 48 oz daily. Stop eating fast food, prepared foods, foods out of a box. No cheese or bacon on salads.

## 2014-03-05 ENCOUNTER — Ambulatory Visit: Payer: BC Managed Care – PPO | Admitting: Nurse Practitioner

## 2014-03-06 ENCOUNTER — Other Ambulatory Visit: Payer: Self-pay | Admitting: Family Medicine

## 2014-03-06 MED ORDER — LOSARTAN POTASSIUM-HCTZ 100-25 MG PO TABS: ORAL_TABLET | ORAL | Status: DC

## 2014-03-08 ENCOUNTER — Ambulatory Visit (HOSPITAL_BASED_OUTPATIENT_CLINIC_OR_DEPARTMENT_OTHER): Payer: BC Managed Care – PPO | Admitting: Radiology

## 2014-03-08 ENCOUNTER — Ambulatory Visit (HOSPITAL_COMMUNITY): Payer: BC Managed Care – PPO | Attending: Cardiology | Admitting: Radiology

## 2014-03-08 DIAGNOSIS — Z8249 Family history of ischemic heart disease and other diseases of the circulatory system: Secondary | ICD-10-CM

## 2014-03-08 DIAGNOSIS — R079 Chest pain, unspecified: Secondary | ICD-10-CM | POA: Insufficient documentation

## 2014-03-08 DIAGNOSIS — R0609 Other forms of dyspnea: Secondary | ICD-10-CM

## 2014-03-08 DIAGNOSIS — I10 Essential (primary) hypertension: Secondary | ICD-10-CM | POA: Diagnosis not present

## 2014-03-08 DIAGNOSIS — R0789 Other chest pain: Secondary | ICD-10-CM

## 2014-03-08 HISTORY — PX: TRANSTHORACIC ECHOCARDIOGRAM: SHX275

## 2014-03-08 MED ORDER — TECHNETIUM TC 99M SESTAMIBI GENERIC - CARDIOLITE
30.0000 | Freq: Once | INTRAVENOUS | Status: AC | PRN
  Administered 2014-03-08: 30 via INTRAVENOUS

## 2014-03-08 MED ORDER — REGADENOSON 0.4 MG/5ML IV SOLN
0.4000 mg | Freq: Once | INTRAVENOUS | Status: AC
  Administered 2014-03-08: 0.4 mg via INTRAVENOUS

## 2014-03-08 NOTE — Progress Notes (Signed)
Echocardiogram performed.  

## 2014-03-08 NOTE — Telephone Encounter (Signed)
Done

## 2014-03-08 NOTE — Progress Notes (Signed)
[facility] [address] [place], [place] [address] [phone]    Cardiology Nuclear Med Study  Sabrina [name] is a 53 y.o. female     MRN : [id number]     DOB: August 29, 1960  Procedure Date: 03/08/2014  Nuclear Med Background Indication for Stress Test:  Evaluation for Ischemia History: No prior known history of CAD Cardiac Risk Factors:Strong Family History - CAD, Hypertension and History of Seizures  Symptoms:Chest pain with/without exertion (last occurrence 2 days ago), DOE, Palpitations and SOB   Nuclear Pre-Procedure Caffeine/Decaff Intake:  None> 12 hrs NPO After: 7:30pm   Lungs:  clear O2 Sat: 98% on room air. IV 0.9% NS with Angio Cath:  22g  IV Site: R Antecubital x 1, tolerated well IV Started by:  Irven Baltimore, RN  Chest Size (in):  44 Cup Size: C  Height: 5\' 4"  (1.626 m)  Weight:  245 lb (111.131 kg)  BMI:  Body mass index is 42.03 kg/(m^2). Tech Comments:  N/A    Nuclear Med Study 1 or 2 day study: 2 day  Stress Test Type:  Treadmill/Lexiscan  Reading MD: N/A  Order Authorizing Provider:  Darlin Coco, MD  Resting Radionuclide: Technetium 63m Sestamibi  Resting Radionuclide Dose: 33.0 mCi on 03/14/14   Stress Radionuclide:  Technetium 74m Sestamibi  Stress Radionuclide Dose: 33.0 mCi on 03/08/14          Stress Protocol Rest HR: 76 Stress HR: 127  Rest BP: 125/70 Stress BP: 168/68  Exercise Time (min): 2:00 METS: n/a   Predicted Max HR: 170 bpm % Max HR: 74.71 bpm Rate Pressure Product: 21336   Dose of Adenosine (mg):  n/a Dose of Lexiscan: 0.4 mg  Dose of Atropine (mg): n/a Dose of Dobutamine: n/a mcg/kg/min (at max HR)  Stress Test Technologist: Irven Baltimore, RN  Nuclear Technologist:  Earl Many, CNMT     Rest Procedure:  Myocardial perfusion imaging was performed at rest 45 minutes following the intravenous administration of Technetium 78m Sestamibi. Rest ECG: NSR - Normal EKG  Stress Procedure:  The  patient received IV Lexiscan 0.4 mg over 15-seconds with concurrent low level exercise and then Technetium 28m Sestamibi was injected at 30-seconds while the patient continued walking one more minute.  The patient complained of DOE, upper (L) thigh, buttock pain, and back pain with Lexiscan.Quantitative spect images were obtained after a 45-minute delay. Stress ECG: No significant change from baseline ECG  QPS Raw Data Images:  Normal; no motion artifact; normal heart/lung ratio. Stress Images:  Normal homogeneous uptake in all areas of the myocardium. Rest Images:  Normal homogeneous uptake in all areas of the myocardium. Subtraction (SDS):  There is no evidence of scar or ischemia. Transient Ischemic Dilatation (Normal <1.22):  1.33 Lung/Heart Ratio (Normal <0.45):  0.24  Quantitative Gated Spect Images QGS EDV:  91 ml QGS ESV:  28 ml  Impression Exercise Capacity:  Lexiscan with low level exercise. BP Response:  Normal blood pressure response. Clinical Symptoms:  Short of breath.  ECG Impression:  No significant ST segment change suggestive of ischemia. Comparison with Prior Nuclear Study: No previous nuclear study performed  Overall Impression:  Low risk stress nuclear study with no definite perfusion defect.  However, the TID ratio is visually elevated and measures 1.33.  Need clinical correlation. Balanced ischemia is a concern, albeit unlikely.  LV Ejection Fraction: 69%.  LV Wall Motion:  NL LV Function; NL Wall Motion   Sabrina [name]  Sabrina [name] 03/14/2014

## 2014-03-09 ENCOUNTER — Telehealth: Payer: Self-pay | Admitting: Cardiology

## 2014-03-09 ENCOUNTER — Encounter: Payer: Self-pay | Admitting: *Deleted

## 2014-03-09 NOTE — Telephone Encounter (Signed)
New Message       Pt calling stating that she had testing done 03/08/14 and forgot to get a note for work. Pt wants to know if the note can be e-mailed, sent through my chart, or if she has to come in and pick it up. Please call pt back and advise. Pt states a message can be left on cell phone if she doesn't answer.

## 2014-03-09 NOTE — Telephone Encounter (Signed)
I spoke with the patient and made her aware we can have her note for work today, but will need to have her come pick it up. She is agreeable.

## 2014-03-14 ENCOUNTER — Ambulatory Visit (HOSPITAL_COMMUNITY): Payer: BC Managed Care – PPO | Attending: Cardiology

## 2014-03-14 DIAGNOSIS — R0989 Other specified symptoms and signs involving the circulatory and respiratory systems: Secondary | ICD-10-CM

## 2014-03-14 MED ORDER — TECHNETIUM TC 99M SESTAMIBI GENERIC - CARDIOLITE
30.0000 | Freq: Once | INTRAVENOUS | Status: AC | PRN
  Administered 2014-03-14: 30 via INTRAVENOUS

## 2014-03-15 HISTORY — PX: CARDIOVASCULAR STRESS TEST: SHX262

## 2014-03-19 ENCOUNTER — Ambulatory Visit (INDEPENDENT_AMBULATORY_CARE_PROVIDER_SITE_OTHER): Payer: BC Managed Care – PPO | Admitting: Family Medicine

## 2014-03-19 ENCOUNTER — Encounter: Payer: Self-pay | Admitting: Family Medicine

## 2014-03-19 ENCOUNTER — Encounter: Payer: Self-pay | Admitting: Gastroenterology

## 2014-03-19 ENCOUNTER — Encounter: Payer: Self-pay | Admitting: Pulmonary Disease

## 2014-03-19 ENCOUNTER — Ambulatory Visit: Payer: BC Managed Care – PPO | Admitting: Family Medicine

## 2014-03-19 VITALS — BP 113/76 | HR 87 | Temp 98.4°F | Resp 18 | Ht 64.0 in | Wt 241.0 lb

## 2014-03-19 DIAGNOSIS — R7989 Other specified abnormal findings of blood chemistry: Secondary | ICD-10-CM

## 2014-03-19 DIAGNOSIS — R748 Abnormal levels of other serum enzymes: Secondary | ICD-10-CM

## 2014-03-19 DIAGNOSIS — R609 Edema, unspecified: Secondary | ICD-10-CM

## 2014-03-19 DIAGNOSIS — J029 Acute pharyngitis, unspecified: Secondary | ICD-10-CM

## 2014-03-19 DIAGNOSIS — D509 Iron deficiency anemia, unspecified: Secondary | ICD-10-CM

## 2014-03-19 LAB — POCT RAPID STREP A (OFFICE): Rapid Strep A Screen: NEGATIVE

## 2014-03-19 MED ORDER — LIDOCAINE VISCOUS 2 % MT SOLN: OROMUCOSAL | Status: DC

## 2014-03-19 MED ORDER — FUROSEMIDE 40 MG PO TABS: ORAL_TABLET | ORAL | Status: DC

## 2014-03-19 NOTE — Patient Instructions (Addendum)
Buy OTC ferrous sulfate (FeSO4) 325mg  tabs, and take 1 tab twice a day for 1 mo, then take one once a day.  Take lasix (on pill) no more than 2 days per week for swelling.

## 2014-03-19 NOTE — Progress Notes (Signed)
OFFICE NOTE  03/19/2014  CC:  Chief Complaint  Patient presents with  . Sore Throat     HPI: Patient is a 53 y.o. Caucasian female who is here for 3 d nasal congest w/PND, ST, mild HA, Tm 99. No cough.  No GER.  Advil/ibup no help for throat pain.  Taking zyrtec and flonase.  She expresses ongoing frustration with swelling in her legs, says that despite taking 80mg  lasix daily lately she still has swelling in both LL's.   Recent rheum labs and cardio w/u unrevealing.  No proteinuria, serum protein level wnl. She continues to struggle with chronic fatigue and myofascial pain syndrome.  Pertinent PMH:  Past medical, surgical, social, and family history reviewed and no changes are noted since last office visit.  MEDS:  Outpatient Prescriptions Prior to Visit  Medication Sig Dispense Refill  . albuterol (VENTOLIN HFA) 108 (90 BASE) MCG/ACT inhaler Inhale 2 puffs into the lungs every 4 (four) hours as needed for wheezing or shortness of breath. 1 Inhaler 1  . Armodafinil (NUVIGIL) 150 MG tablet Take 1 tablet (150 mg total) by mouth daily. 30 tablet 1  . CAMILA 0.35 MG tablet     . CELEBREX 200 MG capsule TAKE 1 CAPSULE (200 MG TOTAL) BY MOUTH DAILY. 30 capsule 6  . clonazePAM (KLONOPIN) 1 MG tablet TAKE ONE TABLET BY MOUTH TWICE DAILY. 60 tablet 5  . desvenlafaxine (PRISTIQ) 100 MG 24 hr tablet Take 100 mg by mouth daily.    . fluticasone (FLONASE) 50 MCG/ACT nasal spray Place 2 sprays into the nose daily.     . furosemide (LASIX) 40 MG tablet Take 2T po qd PRN 2 lb weight gain in 24 hours. 30 tablet 1  . gabapentin (NEURONTIN) 300 MG capsule Take 2 caps three times a day 180 capsule 6  . lidocaine (XYLOCAINE) 5 % ointment Apply 1 application topically 3 (three) times daily as needed. 35.44 g 0  . losartan-hydrochlorothiazide (HYZAAR) 100-25 MG per tablet TAKE 1 TABLET BY MOUTH DAILY. 30 tablet 3  . methocarbamol (ROBAXIN) 500 MG tablet TAKE 1/2 TABLET DURING THE DAY AND 1 TABLET AT  NIGHT. 45 tablet 5  . omeprazole (PRILOSEC) 20 MG capsule TAKE ONE CAPSULE BY MOUTH DAILY 90 capsule 3  . Oxycodone HCl 10 MG TABS 1-2 tabs po q6h prn pain 60 tablet 0  . topiramate (TOPIRAGEN) 25 MG tablet Take 25 mg by mouth daily.       No facility-administered medications prior to visit.    PE: Blood pressure 113/76, pulse 87, temperature 98.4 F (36.9 C), temperature source Temporal, resp. rate 18, height 5\' 4"  (1.626 m), weight 241 lb (109.317 kg), last menstrual period 06/12/2011, SpO2 96 %. VS: noted--normal. Gen: alert, NAD, NONTOXIC APPEARING. HEENT: eyes without injection, drainage, or swelling.  Ears: EACs clear, TMs with normal light reflex and landmarks.  Nose: minimal inflammation, with some dried, crusty exudate adherent to mildly injected mucosa.  No purulent d/c.  No paranasal sinus TTP.  No facial swelling.  Throat and mouth without focal lesion.  No pharyngial swelling, erythema, or exudate.   Neck: supple, no LAD.   LUNGS: CTA bilat, nonlabored resps.   CV: RRR, no m/r/g. EXT: no clubbing or cyanosis.  No joint erythema, warmth, or swelling.  She has 1-2 + pitting edema from just below the knees down into both ankles.    No edema in hands or wrists. SKIN: no rash  LAB: rapid strep NEG   Chemistry  Component Value Date/Time   NA 136 02/27/2014 1201   K 3.8 02/27/2014 1201   CL 105 02/27/2014 1201   CO2 25 02/27/2014 1201   BUN 17 02/27/2014 1201   CREATININE 1.1 02/27/2014 1201   CREATININE 0.73 02/16/2014 1640      Component Value Date/Time   CALCIUM 9.2 02/27/2014 1201   ALKPHOS 73 02/16/2014 1640   AST 33 02/16/2014 1640   ALT 30 02/16/2014 1640   BILITOT 0.3 02/16/2014 1640     Lab Results  Component Value Date   WBC 6.7 02/16/2014   HGB 11.2* 02/16/2014   HCT 35.4* 02/16/2014   MCV 76.5* 02/16/2014   PLT 343 02/16/2014   IMPRESSION AND PLAN:  1) viral URI. Send group A strep clx. Viscous lidocaine rx'd.  2) Mild microcytic anemia;  menses are not an issue.  Urinalysis clean in the recent past. Need to do hemoccult x 3, start 325mg  iron sulfate bid x 21mo then 1 qd.  3) Peripheral edema: unclear etiology, possibly early venous insufficiency. I have never appreciated any upper extremity edema on her exams. MANY labs recently all NORMAL.   Myocardial perfusion imaging and echo normal within the last 1 wk. Unfortunately, he Cr has bumped up since getting on regular/double doses of lasix. I told her to start taking lasix at the 40 mg qd prn dosing no more than 2 days per week. I also told her to try stopping camila to see if some of her fluid retention is hormonally induced. She'll continue low Na diet, elevation of legs, and try to keep active with walking.  An After Visit Summary was printed and given to the patient.  FOLLOW UP: she'll need f/u cBC in 1 mo

## 2014-03-19 NOTE — Progress Notes (Signed)
Pre visit review using our clinic review tool, if applicable. No additional management support is needed unless otherwise documented below in the visit note. 

## 2014-03-20 LAB — CULTURE, GROUP A STREP

## 2014-03-22 MED ORDER — AMOXICILLIN 875 MG PO TABS: 875.0000 mg | ORAL_TABLET | Freq: Two times a day (BID) | ORAL | Status: DC

## 2014-03-22 NOTE — Addendum Note (Signed)
Addended by: Jannette Spanner on: 03/22/2014 08:13 AM   Modules accepted: Orders

## 2014-04-02 ENCOUNTER — Other Ambulatory Visit: Payer: Self-pay | Admitting: Family Medicine

## 2014-04-02 ENCOUNTER — Telehealth: Payer: Self-pay | Admitting: Family Medicine

## 2014-04-02 NOTE — Telephone Encounter (Signed)
Patient requesting rf of oxycodone.  Last RX was 02/16/14.   Please advise.

## 2014-04-02 NOTE — Telephone Encounter (Signed)
Pt requesting rf of celebrex.  Patient requesting rf too early.

## 2014-04-03 MED ORDER — OXYCODONE HCL 10 MG PO TABS: ORAL_TABLET | ORAL | Status: DC

## 2014-04-03 NOTE — Telephone Encounter (Signed)
Oxycodone rx printed. 

## 2014-04-03 NOTE — Telephone Encounter (Signed)
Rx and letter at front desk for p/u.

## 2014-04-05 ENCOUNTER — Other Ambulatory Visit: Payer: Self-pay | Admitting: Family Medicine

## 2014-04-05 MED ORDER — OMEPRAZOLE 20 MG PO CPDR: 20.0000 mg | DELAYED_RELEASE_CAPSULE | Freq: Every day | ORAL | Status: DC

## 2014-04-30 ENCOUNTER — Other Ambulatory Visit: Payer: Self-pay | Admitting: Neurology

## 2014-05-07 ENCOUNTER — Other Ambulatory Visit: Payer: Self-pay | Admitting: Family Medicine

## 2014-05-07 MED ORDER — CELECOXIB 200 MG PO CAPS: ORAL_CAPSULE | ORAL | Status: DC

## 2014-05-07 NOTE — Telephone Encounter (Signed)
Rf request for celebrex. Last OV was 03/19/24.  Last Rx was 10/24/13 x 6 rfs.  Please advise.

## 2014-05-23 ENCOUNTER — Other Ambulatory Visit: Payer: Self-pay | Admitting: Family Medicine

## 2014-05-23 NOTE — Telephone Encounter (Signed)
Duplicate request. Please disregard.

## 2014-05-24 ENCOUNTER — Telehealth: Payer: Self-pay | Admitting: Family Medicine

## 2014-05-24 NOTE — Telephone Encounter (Signed)
[name] - Client [facility] Patient Name: Sabrina [name] DOB: 03-05-1961 Initial Comment Caller states she is having wrist pain in the right hand and would like to see if she could get a cortizone shot called in for her? Nurse Assessment Nurse: Markus Daft, RN, Sherre Poot Date/Time Eilene Ghazi Time): 05/24/2014 4:04:10 PM Confirm and document reason for call. If symptomatic, describe symptoms. ---Caller states she is having right hand/wrist pain, and would like to see if she could come in for a cortizone shot or get another other medication called in for her? 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 She tried oxycodone for the wrist pain and it is not helping the pain. She is taking care of her elderly mother and herself, and the more she used it, it made it difficult to even turn a door handle. -- No injury. Has the patient traveled out of the country within the last 30 days? ---Not Applicable Does the patient require triage? ---Yes Related visit to physician within the last 2 weeks? ---No Does the PT have any chronic conditions? (i.e. diabetes, asthma, etc.) ---Yes List chronic conditions. 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 HTN, Neuropathy, on lasix for fluid retention Did the patient indicate they were pregnant? ---No Guidelines Guideline Title Affirmed Question Affirmed Notes Hand and Wrist Pain [1] SEVERE pain (e.g., excruciating, unable to use hand at all) AND [2] not improved after 2 hours of pain medicine rates pain now at 20/10 with any movements; at rest rates pain 3/10. Final Disposition User See Physician within 4 Hours (or PCP triage) Markus Daft, RN, [place] Comments Uses [facility] - [phone]. NKDA. -- No appts  available and pt does not plan to be seen at [facility] or ER d/t co pay costs. RN will msg. MD and see if he can help in the meantime. She will call back in AM to attempt appt.

## 2014-05-28 NOTE — Telephone Encounter (Signed)
noted 

## 2014-05-30 ENCOUNTER — Ambulatory Visit (INDEPENDENT_AMBULATORY_CARE_PROVIDER_SITE_OTHER): Payer: BC Managed Care – PPO | Admitting: Family Medicine

## 2014-05-30 ENCOUNTER — Encounter: Payer: Self-pay | Admitting: Family Medicine

## 2014-05-30 VITALS — BP 146/84 | HR 83 | Temp 97.8°F | Ht 64.0 in | Wt 241.0 lb

## 2014-05-30 DIAGNOSIS — N3941 Urge incontinence: Secondary | ICD-10-CM

## 2014-05-30 DIAGNOSIS — R350 Frequency of micturition: Secondary | ICD-10-CM

## 2014-05-30 LAB — POCT URINALYSIS DIPSTICK
Bilirubin, UA: NEGATIVE
Glucose, UA: NEGATIVE
Ketones, UA: NEGATIVE
Leukocytes, UA: NEGATIVE
Nitrite, UA: NEGATIVE
PROTEIN UA: NEGATIVE
RBC UA: NEGATIVE
SPEC GRAV UA: 1.015
Urobilinogen, UA: 0.2
pH, UA: 7.5

## 2014-05-30 MED ORDER — OXYBUTYNIN CHLORIDE ER 10 MG PO TB24: 10.0000 mg | ORAL_TABLET | Freq: Every day | ORAL | Status: DC

## 2014-05-30 MED ORDER — OXYCODONE HCL 10 MG PO TABS: ORAL_TABLET | ORAL | Status: DC

## 2014-05-30 NOTE — Telephone Encounter (Signed)
Patient Name: Sabrina [name] DOB: 11-Mar-1961 Initial Comment Caller states she is having frequent urination Nurse Assessment Nurse: Julien Girt, RN, Almyra Free Date/Time Eilene Ghazi Time): 05/30/2014 10:53:09 AM Confirm and document reason for call. If symptomatic, describe symptoms. ---Caller states she has always had frequent urination but it has gotten much worse in the last 48 hrs. She has ongoing back pain, which is also worse, and she has burning with urination. No fever, or hematuroia at this time. Has the patient traveled out of the country within the last 30 days? ---Not Applicable Does the patient require triage? ---Yes Related visit to physician within the last 2 weeks? ---No Does the PT have any chronic conditions? (i.e. diabetes, asthma, etc.) ---Yes List chronic conditions. ---UTI 1-1.5 mths ago, chronic urinary frequency, hx back pain/ surgery, fibromylagia, htn Did the patient indicate they were pregnant? ---No Guidelines Guideline Title Affirmed Question Affirmed Notes Urination Pain - Female [1] SEVERE pain with urination (e.g., excruciating) AND [2] not improved after 2 hours of pain medicine and Sitz bath Final Disposition User See Physician within 4 Hours (or PCP triage) Julien Girt, RN, Almyra Free

## 2014-05-30 NOTE — Progress Notes (Signed)
OFFICE NOTE  05/30/2014  CC: urinary frequency.  HPI: Patient is a 54 y.o. Caucasian female who is here for urinary complaints.  Urinary frequency: has continued to urinate many times a day, also 3-4 times a night since she self d/c'd her lasix 2 wks ago.  Mild burning with urination and this has been the last few days only and she thinks maybe from increased wiping lately.  No blood noted in urine.  Some nausea but no abd pain.  No fever.  Tired, as per her usual, but no excessive thirst. Denies any increased intake of sugary drinks or caffeinated drinks coinciding with her sx's.   She does already have a hx of urge incontinence but these sx's are over and above her usual sx's. ROS: HA's lately, low back hurting worse lately than usual.  No vag d/c or bleeding.  Pertinent PMH:  Past medical, surgical, social, and family history reviewed and no changes are noted since last office visit. No menses in the last 4-5 months.  She states she is not sexually active.  MEDS: Not taking amoxil listed below Outpatient Prescriptions Prior to Visit  Medication Sig Dispense Refill  . albuterol (VENTOLIN HFA) 108 (90 BASE) MCG/ACT inhaler Inhale 2 puffs into the lungs every 4 (four) hours as needed for wheezing or shortness of breath. 1 Inhaler 1  . Armodafinil (NUVIGIL) 150 MG tablet Take 1 tablet (150 mg total) by mouth daily. 30 tablet 1  . celecoxib (CELEBREX) 200 MG capsule TAKE 1 CAPSULE (200 MG TOTAL) BY MOUTH DAILY. 30 capsule 6  . clonazePAM (KLONOPIN) 1 MG tablet TAKE ONE TABLET BY MOUTH TWICE DAILY. 60 tablet 5  . desvenlafaxine (PRISTIQ) 100 MG 24 hr tablet Take 100 mg by mouth daily.    . fluticasone (FLONASE) 50 MCG/ACT nasal spray Place 2 sprays into the nose daily.     . furosemide (LASIX) 40 MG tablet 1 tab po qd prn swelling of extremities (take this no more than 2 days per week) 30 tablet 1  . gabapentin (NEURONTIN) 300 MG capsule TAKE 2 CAPS THREE TIMES A DAY 180 capsule 1  . lidocaine  (XYLOCAINE) 2 % solution 2 tsp swish and gargle q3h prn sore throat pain 100 mL 1  . lidocaine (XYLOCAINE) 5 % ointment Apply 1 application topically 3 (three) times daily as needed. 35.44 g 0  . losartan-hydrochlorothiazide (HYZAAR) 100-25 MG per tablet TAKE 1 TABLET BY MOUTH DAILY. 30 tablet 3  . omeprazole (PRILOSEC) 20 MG capsule Take 1 capsule (20 mg total) by mouth daily. 90 capsule 1  . Oxycodone HCl 10 MG TABS 1-2 tabs po q6h prn pain 60 tablet 0  . topiramate (TOPIRAGEN) 25 MG tablet Take 25 mg by mouth daily.      . methocarbamol (ROBAXIN) 500 MG tablet TAKE 1/2 TABLET DURING THE DAY AND 1 TABLET AT NIGHT. (Patient not taking: Reported on 05/30/2014) 45 tablet 5  . amoxicillin (AMOXIL) 875 MG tablet Take 1 tablet (875 mg total) by mouth 2 (two) times daily. (Patient not taking: Reported on 05/30/2014) 20 tablet 0   No facility-administered medications prior to visit.    PE: Blood pressure 146/84, pulse 83, temperature 97.8 F (36.6 C), temperature source Temporal, height 5\' 4"  (1.626 m), weight 241 lb (109.317 kg), last menstrual period 06/12/2011, SpO2 96 %. Gen: Alert, well appearing.  Patient is oriented to person, place, time, and situation.   CC UA today ALL NORMAL Capillary glucose today: (she had grits about  3 hrs ago, some Dt Mt dew as well): 101  IMPRESSION AND PLAN:  Urge incontinence.  Send urine for c/s for completeness. Start generic Ditropan XL 10mg  qhs.  Therapeutic expectations and side effect profile of medication discussed today.  Patient's questions answered.  She may increase this to 2 tabs qhs in 1 week if not getting effective results. She may go back to taking her lasix 2 times per week prn fluid retention.  Spent 25 min with pt today, with >50% of this time spent in counseling and care coordination regarding the above problems.  An After Visit Summary was printed and given to the patient.  FOLLOW UP: 1 mo

## 2014-05-30 NOTE — Progress Notes (Signed)
Pre visit review using our clinic review tool, if applicable. No additional management support is needed unless otherwise documented below in the visit note. 

## 2014-05-30 NOTE — Patient Instructions (Signed)
You can increase your oxybutynin 10mg  tab to TWO tabs at bedtime in 1 week if you are still urinating more than 2 times per night and if your urinary frequency during the day is still problematic.  Watch for dry mouth and constipation.

## 2014-05-31 ENCOUNTER — Telehealth: Payer: Self-pay

## 2014-05-31 LAB — URINE CULTURE
Colony Count: NO GROWTH
Organism ID, Bacteria: NO GROWTH

## 2014-05-31 NOTE — Telephone Encounter (Signed)
A PA request for pt's nuvigil was sent from [facility].  This was filled out via [email or web address].  A response is expected within 24 hours.  If no fax is received, call [phone] for verdict.    Forwarding to [facility] to follow up on.

## 2014-06-04 ENCOUNTER — Telehealth: Payer: Self-pay | Admitting: Family Medicine

## 2014-06-04 MED ORDER — CLONAZEPAM 1 MG PO TABS: 1.0000 mg | ORAL_TABLET | Freq: Two times a day (BID) | ORAL | Status: DC

## 2014-06-04 NOTE — Telephone Encounter (Signed)
Clonaz rx printed. 

## 2014-06-04 NOTE — Telephone Encounter (Signed)
Rx faxed

## 2014-06-04 NOTE — Telephone Encounter (Signed)
Pt requesting rf of clonazepam.  Last OV was 05/30/14.  Last RX looks like it was printed on 09/11/13 x 5 rfs.  Please advise.

## 2014-06-11 NOTE — Telephone Encounter (Signed)
Called number given for approval verdict, waiting 15 minutes for someone to answer, no one answered phone.  Will try to call back at a later time.

## 2014-06-19 ENCOUNTER — Telehealth: Payer: Self-pay | Admitting: *Deleted

## 2014-06-19 NOTE — Telephone Encounter (Signed)
Called [facility] to obtain PA for Nuvigil.  Nuvigil approved for 05/29/14 - 06/19/15.  Letter will be mailed to patient and our office to verify approval.  Nothing further needed.

## 2014-07-09 ENCOUNTER — Other Ambulatory Visit: Payer: Self-pay | Admitting: *Deleted

## 2014-07-09 MED ORDER — LOSARTAN POTASSIUM-HCTZ 100-25 MG PO TABS: ORAL_TABLET | ORAL | Status: DC

## 2014-08-13 ENCOUNTER — Other Ambulatory Visit: Payer: Self-pay | Admitting: *Deleted

## 2014-08-13 MED ORDER — METHOCARBAMOL 500 MG PO TABS: ORAL_TABLET | ORAL | Status: DC

## 2014-08-13 NOTE — Telephone Encounter (Signed)
Refill request for Methocarbamol Last filled by MD on- 12/27/13 #45 x5  Last Appt: 05/30/2014 Next Appt: none Please advise refill?

## 2014-08-19 ENCOUNTER — Other Ambulatory Visit: Payer: Self-pay | Admitting: Neurology

## 2014-08-20 ENCOUNTER — Other Ambulatory Visit: Payer: Self-pay | Admitting: *Deleted

## 2014-08-20 NOTE — Telephone Encounter (Signed)
Left msg on patient's vm to return my call.

## 2014-08-20 NOTE — Telephone Encounter (Signed)
Patient requesting a refill she also thinks she need to increase on this because its not lasting as long Call back [phone]

## 2014-08-21 MED ORDER — GABAPENTIN 300 MG PO CAPS: ORAL_CAPSULE | ORAL | Status: DC

## 2014-08-21 NOTE — Telephone Encounter (Signed)
Spoke with patient about wanting an increase of the Gabapentin. She states she is having break through pain in between her doses. I spoke with Dr. Delice Lesch about this & she wants patient to increase to 3 capsules in the am, take 2 capsules in the afternoon, take 2 capsules at night. Will send in new Rx to reflect change. Will re-evaluate at patient's f/u visit in June.

## 2014-08-22 DIAGNOSIS — R52 Pain, unspecified: Secondary | ICD-10-CM

## 2014-09-12 ENCOUNTER — Ambulatory Visit: Payer: BC Managed Care – PPO | Admitting: Podiatrist

## 2014-09-25 ENCOUNTER — Other Ambulatory Visit: Payer: Self-pay | Admitting: *Deleted

## 2014-09-25 MED ORDER — CELECOXIB 200 MG PO CAPS: ORAL_CAPSULE | ORAL | Status: DC

## 2014-09-25 NOTE — Telephone Encounter (Signed)
Fax from [facility]) requesting refill for Celebrex 200mg  take 1 cap daily. LOV 05/30/14, no up coming ov, last written: 05/07/14 w/ 6RF. Rx sent for #30 w/ 3RF.

## 2014-09-29 ENCOUNTER — Encounter: Payer: Self-pay | Admitting: Family Medicine

## 2014-09-29 ENCOUNTER — Ambulatory Visit (INDEPENDENT_AMBULATORY_CARE_PROVIDER_SITE_OTHER): Payer: BC Managed Care – PPO | Admitting: Family Medicine

## 2014-09-29 VITALS — BP 110/74 | Temp 97.8°F | Wt 235.0 lb

## 2014-09-29 DIAGNOSIS — J0110 Acute frontal sinusitis, unspecified: Secondary | ICD-10-CM | POA: Diagnosis not present

## 2014-09-29 DIAGNOSIS — J019 Acute sinusitis, unspecified: Secondary | ICD-10-CM | POA: Insufficient documentation

## 2014-09-29 MED ORDER — AMOXICILLIN-POT CLAVULANATE 875-125 MG PO TABS: 1.0000 | ORAL_TABLET | Freq: Two times a day (BID) | ORAL | Status: DC

## 2014-09-29 MED ORDER — OXYCODONE HCL 10 MG PO TABS: ORAL_TABLET | ORAL | Status: DC

## 2014-09-29 NOTE — Patient Instructions (Signed)
Stay off zyrtec for now.  Update your regular doc about the nosebleeds.  Start augmentin today.   Take care.  Glad to see you.

## 2014-09-29 NOTE — Assessment & Plan Note (Signed)
Likely, start augmentin, f/u with PCP. Nonotoxic.  Epistaxis- separate issue, was prev ongoing.  Better on only flonase, was worse on flonase + zyrtec.  She may have had too much of a drying effect from the combination.  Will defer to PCP about f/u on that.  Patient agrees.

## 2014-09-29 NOTE — Progress Notes (Signed)
Med list updated per patient report.    H/o R sided nosebleeds.  Had seen ENT prev.  Still on flonase.  She quit zyrtec this week, less R sided bleeding now.   B frontal and max pressure for this week.  No fevers.  No cough, but postnasal gtt and throat clearing noted.   No rash.  Some itching- noted when off zyrtec.   No ear pain.  No ST.    Meds, vitals, and allergies reviewed.   ROS: See HPI.  Otherwise, noncontributory.  GEN: nad, alert and oriented HEENT: mucous membranes moist, tm w/o erythema, nasal exam w/o erythema, clear discharge noted,  OP with cobblestoning, max and frontal sinuses ttp B NECK: supple w/o LA CV: rrr.   PULM: ctab, no inc wob EXT: no edema

## 2014-10-02 ENCOUNTER — Other Ambulatory Visit: Payer: Self-pay | Admitting: *Deleted

## 2014-10-02 MED ORDER — OXYBUTYNIN CHLORIDE ER 10 MG PO TB24: 10.0000 mg | ORAL_TABLET | Freq: Every day | ORAL | Status: DC

## 2014-10-02 NOTE — Telephone Encounter (Signed)
Fax from [facility]) requesting refill for Oxybutynin ER 10mg  take 1 tab qhs. LOV: 05/30/14, no up coming ov, last written: 05/30/14 w/ 3 #30. Please advise. Thanks.

## 2014-10-09 ENCOUNTER — Encounter: Payer: Self-pay | Admitting: Neurology

## 2014-10-09 ENCOUNTER — Ambulatory Visit (INDEPENDENT_AMBULATORY_CARE_PROVIDER_SITE_OTHER): Payer: BC Managed Care – PPO | Admitting: Neurology

## 2014-10-09 VITALS — BP 110/74 | HR 96 | Resp 16 | Ht 63.5 in | Wt 238.0 lb

## 2014-10-09 DIAGNOSIS — R29818 Other symptoms and signs involving the nervous system: Secondary | ICD-10-CM | POA: Diagnosis not present

## 2014-10-09 DIAGNOSIS — M5417 Radiculopathy, lumbosacral region: Secondary | ICD-10-CM | POA: Diagnosis not present

## 2014-10-09 DIAGNOSIS — M792 Neuralgia and neuritis, unspecified: Secondary | ICD-10-CM | POA: Diagnosis not present

## 2014-10-09 DIAGNOSIS — R2689 Other abnormalities of gait and mobility: Secondary | ICD-10-CM

## 2014-10-09 MED ORDER — GABAPENTIN 300 MG PO CAPS: ORAL_CAPSULE | ORAL | Status: DC

## 2014-10-09 NOTE — Progress Notes (Signed)
NEUROLOGY FOLLOW UP OFFICE NOTE  Sabrina [name] [id number]  HISTORY OF PRESENT ILLNESS: I had the pleasure of seeing Sabrina [name] in follow-up in the neurology clinic on 10/09/2014.  The patient was last seen a year ago for burning pain in both feet suggestive of neuropathic pain. Her EMG/NCV done in July 2015 showed mild S1 radiculopathy and very mild bilateral L5 radiculopathy. No evidence of a large fiber neuropathy. Bloodwork for treatable causes of neuropathy showed an HbA1c of 6, vitamin B12 normal, negative SS-A, SS-B, SPEP/UPEP. She has missed her follow-up due to taking care of her mother and work, and reports that the increase in gabapentin to 900, 600, 600mg  did help, however she continues to have burning in her feet constantly, she cannot stand wearing closed shoes. Symptoms are worse in the evening. She denies any side effects on gabapentin, no daytime drowsiness, dizziness, focal symptoms. She has been dealing with back pain and is concerned she cannot do much exercise due to this, but has been dealing with weight gain and obesity.  She has had an increase in migraines since starting progesterone for cervical carcinoma in situ. She will be switched to Provera next month to hopefully help with this.   HPI: This is a pleasant 53 yo RH woman with a history of hypertension, GERD, OSA on CPAP, obesity, fibromyalgiawith burning pain in both feet that she reports started in 2010 after she had surgery for plantar fasciitis. Initially, she felt that there was a bar in the arch of her foot, symptoms had spread to the middle of her sole up to her toes with numbness, burning, and pins and needles that were pretty constant until she started Neurontin in 2015. She denies any worsening of pain when walking, she can feel the same symptoms after waking up in the morning. She has tried different over the counter creams and compression socks which provide some relief. Sometimes stepping on the cold  floor helps, however she feels that her feet are cold and "cannot warm up." She has muscle cramps in both calves frequently. She denies any involvement of the hands or face. She has back pain and had steroid injections last week. She denies any weakness.  There is a family history of neuropathy in her mother and maternal aunt. She has a history of migraines and takes Topamax 25mg  daily, with migraine attacks 1-2 times a month. She takes oxycodone and Pristiq (on taper) for fibromyalgia, and takes the oxycodone for migraine attacks. She has been taking Nuvigil for a couple of years for daytime drowsiness from sleep apnea, but has been instructed to reduce this.   PAST MEDICAL HISTORY: Past Medical History  Diagnosis Date  . GERD (gastroesophageal reflux disease)   . Hypertension   . Allergy     rhinitis  . History of cervical cancer     Dr. Irven Baltimore (carcinoma in situ): Laser and cone bx 1993, margins neg, paps wnl since.  . Hyperlipidemia     NMR 03/2009.Marland KitchenLDL 161(2735/1887)HDL 37,TG 271  . Subclinical hyperthyroidism 06/19/11    TSH .29 at GYN (T4 and T3 wnl).  Repeat 07/21/11 wnl  . Sleep apnea     Dr. Elsworth Soho: CPAP 10 cm H2O with full facemask as per titration study 03/21/12  . Chronic fatigue     +excessive daytime somnolence  . Fibromyalgia syndrome   . Dysfunctional uterine bleeding     Metromenorrhagia+dysmenorrhea  . Obesity   . History of wrist fracture  left  . Disturbance of smell and taste 2013    ENT eval (Dr. Bosie Clos) 10/2011 --recommended flonase, afrin, mucinex, saline nasal rinse and then do intracranial imaging if none of that helped in 2 mo.  [name] METABOLIC SYNDROME X 06/07/5595    Qualifier: Diagnosis of  By: Linna Darner MD, Gwyndolyn Saxon   Elevated trigs, HTN, elevated waist circumference.   . Migraine syndrome     topiramate has helped immensely  . Abdominal pain 03/11/2012  . Peripheral neuropathy     Burning/numbness bottoms of feet-saw neurologist, Dr.  Delice Lesch, 09/2013.  [name] Chronic low back pain     08/2013 L spine MRI showed L2-3 DDD and L5-S1 DDD w/out foraminal/nerve encroachment--Dr. Ellene Route did ESI and pt states this was not helpful and also she says they caused her to gain wt.    MEDICATIONS: Current Outpatient Prescriptions on File Prior to Visit  Medication Sig Dispense Refill  . amoxicillin-clavulanate (AUGMENTIN) 875-125 MG per tablet Take 1 tablet by mouth 2 (two) times daily. 20 tablet 0  . celecoxib (CELEBREX) 200 MG capsule TAKE 1 CAPSULE (200 MG TOTAL) BY MOUTH DAILY. 30 capsule 3  . clonazePAM (KLONOPIN) 1 MG tablet Take 1 tablet (1 mg total) by mouth 2 (two) times daily. 60 tablet 5  . fluticasone (FLONASE) 50 MCG/ACT nasal spray Place 2 sprays into the nose daily.     . furosemide (LASIX) 40 MG tablet 1 tab po qd prn swelling of extremities (take this no more than 2 days per week) 30 tablet 1  . gabapentin (NEURONTIN) 300 MG capsule Take 3 capsules in the morning, 2 capsules in the afternoon, 2 capsules at night 210 capsule 2  . losartan-hydrochlorothiazide (HYZAAR) 100-25 MG per tablet TAKE 1 TABLET BY MOUTH DAILY. 30 tablet 3  . methocarbamol (ROBAXIN) 500 MG tablet TAKE 1/2 TABLET DURING THE DAY AND 1 TABLET AT NIGHT. 45 tablet 5  . omeprazole (PRILOSEC) 20 MG capsule Take 1 capsule (20 mg total) by mouth daily. (Patient taking differently: Take 20 mg by mouth 2 (two) times daily. ) 90 capsule 1  . oxybutynin (DITROPAN-XL) 10 MG 24 hr tablet Take 1 tablet (10 mg total) by mouth at bedtime. (Patient taking differently: Take 10 mg by mouth at bedtime as needed. ) 30 tablet 11  . Oxycodone HCl 10 MG TABS 1-2 tabs po q6h prn pain    . [DISCONTINUED] lisdexamfetamine (VYVANSE) 30 MG capsule 1 cap po qAM x 7d, then open capsule and take 1/2 of contents qd x 6d 10 capsule 0  . [DISCONTINUED] venlafaxine (EFFEXOR-XR) 150 MG 24 hr capsule Take 150 mg by mouth daily.       No current facility-administered medications on file prior to  visit.    ALLERGIES: Allergies  Allergen Reactions  . Hydrocodone-Acetaminophen     Pt states she has itching after taking large quantities.  OK prn.    FAMILY HISTORY: Family History  Problem Relation Age of Onset  . Emphysema Mother   . Heart disease Mother 64    MI  . Emphysema Father   . Heart disease Father 57    MI  . Cancer Maternal Grandmother     BREAST  . Heart disease Maternal Grandmother     MI  . Heart disease Maternal Grandfather     MI  . Cancer Paternal Grandmother     STOMACH  . Cancer Paternal Grandfather     ? INTRA ABDOMINAL  . Heart disease Paternal Grandfather  MI    SOCIAL HISTORY: History   Social History  . Marital Status: Divorced    Spouse Name: N/A  . Number of Children: N/A  . Years of Education: N/A   Occupational History  . 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 Smoking status: Never Smoker   . Smokeless tobacco: Never Used  . Alcohol Use: No  . Drug Use: No  . Sexual Activity: Not on file   Other Topics Concern  . Not on file   Social History Narrative   Divorced, LIVES WITH 1 SON.   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  No T/A/Ds.   [place]    REVIEW OF SYSTEMS: Constitutional: No fevers, chills, or sweats, no generalized fatigue, change in appetite Eyes: No visual changes, double vision, eye pain Ear, nose and throat: No hearing loss, ear pain, nasal congestion, sore throat Cardiovascular: No chest pain, palpitations Respiratory:  No shortness of breath at rest or with exertion, wheezes GastrointestinaI: No nausea, vomiting, diarrhea, abdominal pain, fecal incontinence Genitourinary:  No dysuria, urinary retention or frequency Musculoskeletal:  No neck pain, +back pain Integumentary: No rash, pruritus, skin lesions Neurological: as above Psychiatric: No depression, insomnia, anxiety Endocrine: No palpitations,  fatigue, diaphoresis, mood swings, change in appetite, change in weight, increased thirst Hematologic/Lymphatic:  No anemia, purpura, petechiae. Allergic/Immunologic: no itchy/runny eyes, nasal congestion, recent allergic reactions, rashes  PHYSICAL EXAM: Filed Vitals:   10/09/14 1103  BP: 110/74  Pulse: 96  Resp: 16   General: No acute distress Head:  Normocephalic/atraumatic Neck: supple, no paraspinal tenderness, full range of motion Heart:  Regular rate and rhythm Lungs:  Clear to auscultation bilaterally Back: No paraspinal tenderness Skin/Extremities: No rash, no edema Neurological Exam: alert and oriented to person, place, and time. No aphasia or dysarthria. Fund of knowledge is appropriate.  Recent and remote memory are intact.  Attention and concentration are normal.    Able to name objects and repeat phrases. Cranial nerves: Pupils equal, round, reactive to light.  Fundoscopic exam unremarkable, no papilledema. Extraocular movements intact with no nystagmus. Visual fields full. Facial sensation intact. No facial asymmetry. Tongue, uvula, palate midline.  Motor: Bulk and tone normal, muscle strength 5/5 throughout with no pronator drift.  Sensation decreased to cold in a stocking distribution up to ankles bilaterally, intact to pin and light touch, vibration. Intact on both UE. No extinction to double simultaneous stimulation.  Deep tendon reflexes 2+ throughout except for +1 bilateral ankles, toes downgoing.  Finger to nose testing intact.  Gait narrow-based and steady, able to tandem walk adequately.  Romberg negative.  IMPRESSION: This is a 54 yo RH woman with a history of hypertension, GERD, OSA on CPAP, obesity, fibromyalgia, with numbness, burning, and pins and needles sensation in both feet, clinically suggestive of neuropathic pain. EMG did not show large fiber neuropathy, however small fiber neuropathy cannot be ruled out. She also has significant back pain, EMG did show  radiculopathy at L5 and S1. She will be referred to PT for the back pain and balance. She will increase gabapentin to 900mg  TID, other options including tricyclics were discussed, however she is trying to taper off Pristiq and other medications which could potentially alter her mood. This may be added on her next visit. She will follow-up in 2-3 months and knows to call our office for any problems.  Thank you for allowing me to participate in her care.  Please do not hesitate to call for any questions or concerns.  The duration of this appointment visit was 25 minutes of face-to-face time with the patient.  Greater than 50% of this time was spent in counseling, explanation of diagnosis, planning of further management, and coordination of care.   Ellouise Newer, M.D.   CC: Dr. Anitra Lauth

## 2014-10-09 NOTE — Patient Instructions (Signed)
1. Increase gabapentin 300mg : take 3 caps three times a day 2. Refer to Physical Therapy for balance therapy, back pain from radiculopathy 3. Follow-up in 2-3 months

## 2014-10-17 ENCOUNTER — Ambulatory Visit: Payer: BC Managed Care – PPO | Attending: Neurology

## 2014-10-17 DIAGNOSIS — M25652 Stiffness of left hip, not elsewhere classified: Secondary | ICD-10-CM | POA: Insufficient documentation

## 2014-10-17 DIAGNOSIS — R269 Unspecified abnormalities of gait and mobility: Secondary | ICD-10-CM

## 2014-10-17 DIAGNOSIS — M5442 Lumbago with sciatica, left side: Secondary | ICD-10-CM | POA: Diagnosis not present

## 2014-10-17 NOTE — Therapy (Signed)
[facility] [address] [place], [place], [address] Phone: [phone]   Fax:  [phone]  Physical Therapy Evaluation  Patient Details  Name: Sabrina [name] MRN: [id number] Date of Birth: 1961-02-22 Referring Provider:  Cameron Sprang, MD  Encounter Date: 10/17/2014      PT End of Session - 10/17/14 1120    Visit Number 1   Date for PT Re-Evaluation 12/12/14   PT Start Time 1019   PT Stop Time 1110   PT Time Calculation (min) 51 min   Activity Tolerance Patient tolerated treatment well   Behavior During Therapy [facility] for tasks assessed/performed      Past Medical History  Diagnosis Date  . GERD (gastroesophageal reflux disease)   . Hypertension   . Allergy     rhinitis  . History of cervical cancer     Dr. Irven Baltimore (carcinoma in situ): Laser and cone bx 1993, margins neg, paps wnl since.  . Hyperlipidemia     NMR 03/2009.Marland KitchenLDL 161(2735/1887)HDL 37,TG 271  . Subclinical hyperthyroidism 06/19/11    TSH .29 at GYN (T4 and T3 wnl).  Repeat 07/21/11 wnl  . Sleep apnea     Dr. Elsworth Soho: CPAP 10 cm H2O with full facemask as per titration study 03/21/12  . Chronic fatigue     +excessive daytime somnolence  . Fibromyalgia syndrome   . Dysfunctional uterine bleeding     Metromenorrhagia+dysmenorrhea  . Obesity   . History of wrist fracture     left  . Disturbance of smell and taste 2013    ENT eval (Dr. Bosie Clos) 10/2011 --recommended flonase, afrin, mucinex, saline nasal rinse and then do intracranial imaging if none of that helped in 2 mo.  [name] METABOLIC SYNDROME X 6/96/2952    Qualifier: Diagnosis of  By: Linna Darner MD, Gwyndolyn Saxon   Elevated trigs, HTN, elevated waist circumference.   . Migraine syndrome     topiramate has helped immensely  . Abdominal pain 03/11/2012  . Peripheral neuropathy     Burning/numbness bottoms of feet-saw neurologist, Dr. Delice Lesch, 09/2013.  [name] Chronic low back pain     08/2013 L  spine MRI showed L2-3 DDD and L5-S1 DDD w/out foraminal/nerve encroachment--Dr. Ellene Route did ESI and pt states this was not helpful and also she says they caused her to gain wt.  . Cancer     Past Surgical History  Procedure Laterality Date  . Sinus surgery for septal deviation  1999    & polyps  . Wrist surgery      left  . Knee arthroscopy  04/1988    x2,open procedure for hamstring tendon injury)  . Microdiscectomy lumbar  09/2003    L5/S1 left microdiscectomy (Dr. Patrice Paradise)  . Plantar fascia release  02/2009    Bilateral (Dr. Shellia Carwin)  . Combined hysteroscopy diagnostic / d&c  03/2008    Done for thickened posterior endometrium found on w/u for menorrhagia.  Pathology-benign.  . Anal sphincterotomy  2007    for deep anal fissure (Dr. Brantley Stage)  . Hemorrhoid surgery  2006    Prolapsed internal hemorrhoids (Dr. Brantley Stage)  . 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 Cardiovascular stress test  03/08/14    Normal  . Transthoracic echocardiogram  03/08/14    Normal    There were no vitals filed for this visit.  Visit Diagnosis:  Bilateral low back pain with left-sided sciatica - Plan: PT plan of care cert/re-cert  Hip stiffness, left - Plan: PT plan of care cert/re-cert  Abnormality of gait - Plan: PT plan of care cert/re-cert      Subjective Assessment - 10/17/14 1032    Subjective 54 y.o female with chronic low back pain and has noticed increase in pain since gaining weight over the years. Also has hx of peripheral neuropathy in both feet; currently taking medicine for that. Has noticed increased loss of balance and falls; has a fear of taking steps at school. Hx of cervical cancer may also be contributing to LBP; currently taking medicatoin and being monitored  by MD for CA dx.     Pertinent History Hx of back surgery (12 years ago), fibromyalgia, hx of CA    Limitations Standing;Walking   How long can you sit comfortably? Not limited    How long can you  stand comfortably? 15 minutes    How long can you walk comfortably? 10 minutes    Diagnostic tests Not had one yet    Patient Stated Goals Walking for 30 minutes for exercise, reduce pain, to be able to do household activities without pain    Currently in Pain? Yes   Pain Score 4    Pain Location Back   Pain Orientation Right   Pain Descriptors / Indicators Squeezing;Aching   Pain Type Chronic pain   Pain Radiating Towards Sometimes goes down the Lt. leg (may occur at the end of the day)    Pain Onset More than a month ago   Pain Frequency Intermittent   Aggravating Factors  Walking, bending over, lifting heavy objects, standing   Pain Relieving Factors stretches, sitting down, medications            [facility] PT Assessment - 10/17/14 0001    Assessment   Medical Diagnosis lumbosacral radiculopathy, balance problems   Onset Date/Surgical Date 10/16/00   Next MD Visit June 2017   Prior Therapy Physical therapy for fibromyalgia, LBP 3 years ago     Precautions   Precautions Fall;Other (comment)  Hx of CA    Restrictions   Weight Bearing Restrictions No   Balance Screen   Has the patient fallen in the past 6 months Yes   How many times? 4   Has the patient had a decrease in activity level because of a fear of falling?  Yes  Afraid to take steps at school    Is the patient reluctant to leave their home because of a fear of falling?  No   Home [employer] residence   Living Arrangements Parent  currently staying with mom    Type of [place] to enter   Entrance Stairs-Number of Steps 2   [place] Two level   Alternate Level Stairs-Number of Steps 14   Alternate Level Stairs-Rails --  1 Freight forwarder - single point   Prior Function   Level of Independence Independent   Cognition   Overall Cognitive Status Within Functional Limits for tasks assessed   Observation/Other Assessments   Focus on Therapeutic  Outcomes (FOTO)  62% limitation   ROM / Strength   AROM / PROM / Strength AROM;PROM;Strength   AROM   Overall AROM  Within functional limits for tasks performed   Overall AROM Comments Lumbar motions WFL but painful with bil SB; extension and repeated flexion   1 rep flexion improved sx.; no pain with bil rotation  PROM   Overall PROM  Deficits   Overall PROM Comments Limited Lt. hip IR motion 25% vs. Rt.; increase in pain sx with Lt. hip IR   Strength   Overall Strength Deficits   Overall Strength Comments 4/5 for bilateral LE;   Increased shaking with exertion    Flexibility   Soft Tissue Assessment /Muscle Length yes   Hamstrings SLR: normal flexibilty    Piriformis Tenderness to palpation bilaterally; increase in tingling/pain symptoms with Lt. piriformis palpation    Palpation   Palpation comment P-A mobs L1-L5 stiffness; increased lordosis at L4-L5; L5 P-A caused raditaing pain into Lt. buttock   Special Tests    Special Tests Lumbar   Lumbar Tests Slump Test   Slump test   Findings Positive   Side Left   Balance   Balance Assessed Yes  SLS test: 13 sec Lt; 12 sec on Rt.                            PT Education - 10/17/14 1053    Education provided Yes   Education Details HEP: single knee to chest, seated hamstring stretch, seated piriformis   Person(s) Educated Patient   Methods Explanation;Demonstration;Handout   Comprehension Verbalized understanding;Returned demonstration          PT Short Term Goals - 10/17/14 1138    PT SHORT TERM GOAL #1   Title Independent with HEP    Time 4   Period Weeks   Status New   PT SHORT TERM GOAL #2   Title Increase SLS time to 20 second bilaterally to improve balance with stair climbing    Time 4   Period Weeks   Status New   PT SHORT TERM GOAL #3   Title Improve walking time to 15 minutes without increase in LBP in order to perform errands    Time 4   Period Weeks   Status New   PT SHORT TERM  GOAL #4   Title Report 20% decrease pain in LBP when doing household activities 4   Time 4   Period Weeks   Status New           PT Long Term Goals - 10/17/14 1144    PT LONG TERM GOAL #1   Title Independent with advanced HEP    Time 8   Period Weeks   Status New   PT LONG TERM GOAL #2   Title Reduce FOTO limitation to < or = 48%    Time 8   Period Weeks   Status New   PT LONG TERM GOAL #3   Title Increasing walking ability to 30 minutes to participate in exercise program    Time 8   Period Weeks   Status New   PT LONG TERM GOAL #4   Title Improve SLS to 30 seconds bilaterally to decrease risks for falling when performing ADLs    Time 8   Period Weeks   Status New   PT LONG TERM GOAL #5   Title Verbalize knowledge of doing personal exericse progam for weight loss    Time 8   Period Weeks   Status New               Plan - 10/17/14 1127    Clinical Impression Statement 54 y.o female presents with decreased balance and chronic low back pain with intermittent sciatica that has lasted for 12 years. Has noticed  an increase in LBP with continued weight gain. Limited Lt. hip IR rotation and tenderness at Lt. piriformis indicate muscle tightness contributing to LBP.  Lumbar ROM WNL for all motions but painful with all motions; extension peripheralized LBP to hip. SLS score indicates high falls risk for given age; peripheral neuroapthy sx may be contributing to decreased balance. Will benefit from skilled PT to increase balance, increased LE strength/flexibilty and for postural/body mechanics education to decrease LBP and improve balance.        Pt will benefit from skilled therapeutic intervention in order to improve on the following deficits Decreased endurance;Decreased activity tolerance;Pain;Improper body mechanics;Decreased balance;Decreased mobility;Decreased strength;Impaired sensation   Rehab Potential Good   PT Frequency 2x / week   PT Duration 8 weeks   PT  Treatment/Interventions Moist Heat;Traction;Therapeutic activities;Therapeutic exercise;Energy conservation;Balance training;Neuromuscular re-education;Stair training;Functional mobility training;Patient/family education;Passive range of motion   PT Next Visit Plan Begin on bike, try core stabilizing in supine and standing, stair training, SLS at trampoline/foam mat; review HEP; review proper squatting techniques    Consulted and Agree with Plan of Care Patient         Problem List Patient Active Problem List   Diagnosis Date Noted  . Lumbosacral radiculopathy 10/09/2014  . Acute sinus infection 09/29/2014  . SOB (shortness of breath) on exertion 03/03/2014  . Rapid weight gain 03/03/2014  . ECG abnormality 03/03/2014  . Lumbago 02/20/2014  . SOB (shortness of breath) 02/20/2014  . Myalgia and myositis 02/20/2014  . Peripheral edema 02/20/2014  . Subclinical hypothyroidism 02/20/2014  . 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 Neuropathic pain 09/27/2013  . Sinusitis, acute 09/11/2013  . Metatarsalgia 03/31/2013  . Health maintenance examination 03/31/2013  . Urine frequency 12/13/2012  . Non-healing skin lesion of nose 12/13/2012  . Watery eyes 06/16/2012  . Abdominal pain 03/11/2012  . Obesities, morbid 03/08/2012  . Subclinical hyperthyroidism 12/06/2011  . Weight gain, abnormal 11/11/2011  . 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 Fibromyalgia syndrome 08/05/2011  . Anxiety 07/11/2011  . ABDOMINAL PAIN, EPIGASTRIC 07/17/2010  . Anemia 12/31/2009  . VISUAL ACUITY, DECREASED 12/31/2009  . Other and unspecified hyperlipidemia 11/26/2008  . METABOLIC SYNDROME X 88/89/1694  . NONSPECIFIC ABNORM RESULTS THYROID FUNCT STUDY 11/26/2008  . ALLERGIC RHINITIS 08/29/2008  . ELEVATED DIAPHRAM 08/29/2008  . HYPERSOMNIA, ASSOCIATED WITH SLEEP APNEA 08/29/2008  . Other dyspnea and respiratory abnormality 08/14/2008  . HYPERTENSION, ESSENTIAL NOS 09/23/2007  . GERD 09/23/2007   Reginal Lutes,  SPT 10/17/2014 12:11 PM During this treatment session, the therapist was present, participating in, and directing the treatment. TAKACS,KELLY 10/17/2014, 12:11 PM  [facility] [address] [place], [place], [address] Phone: [phone]   Fax:  [phone]

## 2014-10-17 NOTE — Patient Instructions (Addendum)
Knee to Chest   Lying supine, bend involved knee to chest.  Hold 20 seconds, repeat 3 ___ times. Repeat with other leg. Do _3__ times per day.  Copyright  VHI. All rights reserved.  HIP: Hamstrings - Short Sitting   Rest leg on raised surface. Keep knee straight. Lift chest. Hold _20__ seconds. _3__ reps per set, _3__ sets per day  Copyright  VHI. All rights reserved.  Piriformis Stretch, Sitting   Sit, one ankle on opposite knee, same-side hand on crossed knee. Push down on knee, keeping spine straight. Lean torso forward, with flat back, until tension is felt in hamstrings and gluteals of crossed-leg side. Hold _20__ seconds.  Repeat _3__ times per session. Do _3__ sessions per day.  Copyright  VHI. All rights reserved.

## 2014-10-22 ENCOUNTER — Ambulatory Visit: Payer: BC Managed Care – PPO

## 2014-10-22 DIAGNOSIS — R269 Unspecified abnormalities of gait and mobility: Secondary | ICD-10-CM

## 2014-10-22 DIAGNOSIS — M5442 Lumbago with sciatica, left side: Secondary | ICD-10-CM

## 2014-10-22 DIAGNOSIS — M25652 Stiffness of left hip, not elsewhere classified: Secondary | ICD-10-CM

## 2014-10-22 NOTE — Therapy (Signed)
[facility] [address], [place], [place], [address] Phone: [phone]   Fax:  [phone]  Physical Therapy Treatment  Patient Details  Name: Sabrina [name] MRN: [id number] Date of Birth: 05-23-1960 Referring Provider:  Tammi Sou, MD  Encounter Date: 10/22/2014      PT End of Session - 10/22/14 1216    Visit Number 2   Date for PT Re-Evaluation 12/12/14   PT Start Time 1143   PT Stop Time 1240   PT Time Calculation (min) 57 min   Activity Tolerance Patient tolerated treatment well   Behavior During Therapy [facility] for tasks assessed/performed      Past Medical History  Diagnosis Date  . GERD (gastroesophageal reflux disease)   . Hypertension   . Allergy     rhinitis  . History of cervical cancer     Dr. Irven Baltimore (carcinoma in situ): Laser and cone bx 1993, margins neg, paps wnl since.  . Hyperlipidemia     NMR 03/2009.Marland KitchenLDL 161(2735/1887)HDL 37,TG 271  . Subclinical hyperthyroidism 06/19/11    TSH .29 at GYN (T4 and T3 wnl).  Repeat 07/21/11 wnl  . Sleep apnea     Dr. Elsworth Soho: CPAP 10 cm H2O with full facemask as per titration study 03/21/12  . Chronic fatigue     +excessive daytime somnolence  . Fibromyalgia syndrome   . Dysfunctional uterine bleeding     Metromenorrhagia+dysmenorrhea  . Obesity   . History of wrist fracture     left  . Disturbance of smell and taste 2013    ENT eval (Dr. Bosie Clos) 10/2011 --recommended flonase, afrin, mucinex, saline nasal rinse and then do intracranial imaging if none of that helped in 2 mo.  [name] METABOLIC SYNDROME X 07/17/1759    Qualifier: Diagnosis of  By: Linna Darner MD, Gwyndolyn Saxon   Elevated trigs, HTN, elevated waist circumference.   . Migraine syndrome     topiramate has helped immensely  . Abdominal pain 03/11/2012  . Peripheral neuropathy     Burning/numbness bottoms of feet-saw neurologist, Dr. Delice Lesch, 09/2013.  [name] Chronic low back pain     08/2013 L  spine MRI showed L2-3 DDD and L5-S1 DDD w/out foraminal/nerve encroachment--Dr. Ellene Route did ESI and pt states this was not helpful and also she says they caused her to gain wt.  . Cancer     Past Surgical History  Procedure Laterality Date  . Sinus surgery for septal deviation  1999    & polyps  . Wrist surgery      left  . Knee arthroscopy  04/1988    x2,open procedure for hamstring tendon injury)  . Microdiscectomy lumbar  09/2003    L5/S1 left microdiscectomy (Dr. Patrice Paradise)  . Plantar fascia release  02/2009    Bilateral (Dr. Shellia Carwin)  . Combined hysteroscopy diagnostic / d&c  03/2008    Done for thickened posterior endometrium found on w/u for menorrhagia.  Pathology-benign.  . Anal sphincterotomy  2007    for deep anal fissure (Dr. Brantley Stage)  . Hemorrhoid surgery  2006    Prolapsed internal hemorrhoids (Dr. Brantley Stage)  . 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 Cardiovascular stress test  03/08/14    Normal  . Transthoracic echocardiogram  03/08/14    Normal    There were no vitals filed for this visit.  Visit Diagnosis:  Bilateral low back pain with left-sided sciatica - Plan: PT plan of care cert/re-cert  Hip stiffness, left - Plan: PT plan of care cert/re-cert  Abnormality of gait - Plan: PT plan of care cert/re-cert      Subjective Assessment - 10/22/14 1150    Subjective Pt reports that she has been doing exercises with consistency.     Currently in Pain? Yes   Pain Score 2    Pain Location Back   Pain Orientation Left;Right   Pain Descriptors / Indicators Aching;Tightness   Pain Type Chronic pain   Pain Onset More than a month ago   Pain Frequency Intermittent   Aggravating Factors  walking, bending over, cooking/housework, lifting, standing   Pain Relieving Factors stretching, sitting down, medications            [facility] PT Assessment - 10/22/14 0001    Assessment   Medical Diagnosis lumbosacral radiculopathy, balance problems   Onset  Date/Surgical Date 10/16/00   Next MD Visit June 2017   Precautions   Precautions Fall;Other (comment)  Hx of CA    Restrictions   Weight Bearing Restrictions No   Prior Function   Level of Independence Independent   Cognition   Overall Cognitive Status Within Functional Limits for tasks assessed   Observation/Other Assessments   Focus on Therapeutic Outcomes (FOTO)  62% limitation                     [facility] Adult PT Treatment/Exercise - 10/22/14 0001    Exercises   Exercises Lumbar;Knee/Hip   Lumbar Exercises: Stretches   Active Hamstring Stretch 3 reps;20 seconds  performed seated and supine with strap   Single Knee to Chest Stretch 3 reps;20 seconds   Piriformis Stretch 3 reps;20 seconds   Lumbar Exercises: Aerobic   Stationary Bike Level 1 x 6 minutes   Knee/Hip Exercises: Standing   Other Standing Knee Exercises mini tramp: weight shifting 3 ways x 1 minute   Modalities   Modalities Electrical Stimulation;Cryotherapy   Moist Heat Therapy   Number Minutes Moist Heat --   Moist Heat Location --   Cryotherapy   Number Minutes Cryotherapy 15 Minutes   Cryotherapy Location Lumbar Spine   Type of Cryotherapy Ice pack   Electrical Stimulation   Electrical Stimulation Location lumbar   Electrical Stimulation Action IFC   Electrical Stimulation Parameters 15 minutes   Electrical Stimulation Goals Pain                  PT Short Term Goals - 10/22/14 1154    PT SHORT TERM GOAL #1   Title Independent with HEP    Time 4   Period Weeks   Status On-going  independent with initial exercises   PT SHORT TERM GOAL #2   Title Increase SLS time to 20 second bilaterally to improve balance with stair climbing    Time 4   Period Weeks   Status On-going   PT SHORT TERM GOAL #3   Title Improve walking time to 15 minutes without increase in LBP in order to perform errands    Time 4   Period Weeks   Status On-going   PT SHORT TERM GOAL #4   Title Report  20% decrease pain in LBP when doing household activities 4   Time 4   Period Weeks   Status On-going           PT Long Term Goals - 10/17/14 1144    PT LONG TERM GOAL #1   Title Independent with advanced HEP  Time 8   Period Weeks   Status New   PT LONG TERM GOAL #2   Title Reduce FOTO limitation to < or = 48%    Time 8   Period Weeks   Status New   PT LONG TERM GOAL #3   Title Increasing walking ability to 30 minutes to participate in exercise program    Time 8   Period Weeks   Status New   PT LONG TERM GOAL #4   Title Improve SLS to 30 seconds bilaterally to decrease risks for falling when performing ADLs    Time 8   Period Weeks   Status New   PT LONG TERM GOAL #5   Title Verbalize knowledge of doing personal exericse progam for weight loss    Time 8   Period Weeks   Status New               Plan - 10/22/14 1156    Clinical Impression Statement Pt with only 1 session after evaluation so limited progress toward goals.  Pt has chronic hip stiffness and pain with long periods of standing.  Pt will benefit from skilled PT to improve flexiblity, core strength, balance and pain management.     Pt will benefit from skilled therapeutic intervention in order to improve on the following deficits Decreased endurance;Decreased activity tolerance;Pain;Improper body mechanics;Decreased balance;Decreased mobility;Decreased strength;Impaired sensation   Rehab Potential Good   PT Frequency 2x / week   PT Duration 8 weeks   PT Treatment/Interventions Moist Heat;Traction;Therapeutic activities;Therapeutic exercise;Energy conservation;Balance training;Neuromuscular re-education;Stair training;Functional mobility training;Patient/family education;Passive range of motion;Cryotherapy;Electrical Stimulation;Manual techniques;ADLs/Self Care Home Management   PT Next Visit Plan Begin on bike, try core stabilizing in supine and standing, stair training, SLS at trampoline/foam mat;  review HEP; review proper squatting techniques         Problem List Patient Active Problem List   Diagnosis Date Noted  . Lumbosacral radiculopathy 10/09/2014  . Acute sinus infection 09/29/2014  . SOB (shortness of breath) on exertion 03/03/2014  . Rapid weight gain 03/03/2014  . ECG abnormality 03/03/2014  . Lumbago 02/20/2014  . SOB (shortness of breath) 02/20/2014  . Myalgia and myositis 02/20/2014  . Peripheral edema 02/20/2014  . Subclinical hypothyroidism 02/20/2014  . 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 Neuropathic pain 09/27/2013  . Sinusitis, acute 09/11/2013  . Metatarsalgia 03/31/2013  . Health maintenance examination 03/31/2013  . Urine frequency 12/13/2012  . Non-healing skin lesion of nose 12/13/2012  . Watery eyes 06/16/2012  . Abdominal pain 03/11/2012  . Obesities, morbid 03/08/2012  . Subclinical hyperthyroidism 12/06/2011  . Weight gain, abnormal 11/11/2011  . 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 Fibromyalgia syndrome 08/05/2011  . Anxiety 07/11/2011  . ABDOMINAL PAIN, EPIGASTRIC 07/17/2010  . Anemia 12/31/2009  . VISUAL ACUITY, DECREASED 12/31/2009  . Other and unspecified hyperlipidemia 11/26/2008  . METABOLIC SYNDROME X 28/76/8115  . NONSPECIFIC ABNORM RESULTS THYROID FUNCT STUDY 11/26/2008  . ALLERGIC RHINITIS 08/29/2008  . ELEVATED DIAPHRAM 08/29/2008  . HYPERSOMNIA, ASSOCIATED WITH SLEEP APNEA 08/29/2008  . Other dyspnea and respiratory abnormality 08/14/2008  . HYPERTENSION, ESSENTIAL NOS 09/23/2007  . GERD 09/23/2007    TAKACS,KELLY, PT 10/22/2014, 12:27 PM  [facility] [address] [place], [place], [address] Phone: [phone]   Fax:  [phone]

## 2014-10-24 ENCOUNTER — Encounter: Payer: Self-pay | Admitting: Physical Therapy

## 2014-10-24 ENCOUNTER — Ambulatory Visit: Payer: BC Managed Care – PPO | Admitting: Physical Therapy

## 2014-10-24 DIAGNOSIS — R269 Unspecified abnormalities of gait and mobility: Secondary | ICD-10-CM

## 2014-10-24 DIAGNOSIS — M25652 Stiffness of left hip, not elsewhere classified: Secondary | ICD-10-CM

## 2014-10-24 DIAGNOSIS — M5442 Lumbago with sciatica, left side: Secondary | ICD-10-CM | POA: Diagnosis not present

## 2014-10-24 NOTE — Therapy (Signed)
[facility] [address] [place], [place], [address] Phone: [phone]   Fax:  [phone]  Physical Therapy Treatment  Patient Details  Name: Sabrina [name] MRN: [id number] Date of Birth: 04/25/1961 Referring Provider:  Tammi Sou, MD  Encounter Date: 10/24/2014      PT End of Session - 10/24/14 1717    Visit Number 3   Date for PT Re-Evaluation 12/12/14   PT Start Time 1622   PT Stop Time 2633   PT Time Calculation (min) 53 min   Activity Tolerance Patient tolerated treatment well   Behavior During Therapy [facility] for tasks assessed/performed      Past Medical History  Diagnosis Date  . GERD (gastroesophageal reflux disease)   . Hypertension   . Allergy     rhinitis  . History of cervical cancer     Dr. Irven Baltimore (carcinoma in situ): Laser and cone bx 1993, margins neg, paps wnl since.  . Hyperlipidemia     NMR 03/2009.Marland KitchenLDL 161(2735/1887)HDL 37,TG 271  . Subclinical hyperthyroidism 06/19/11    TSH .29 at GYN (T4 and T3 wnl).  Repeat 07/21/11 wnl  . Sleep apnea     Dr. Elsworth Soho: CPAP 10 cm H2O with full facemask as per titration study 03/21/12  . Chronic fatigue     +excessive daytime somnolence  . Fibromyalgia syndrome   . Dysfunctional uterine bleeding     Metromenorrhagia+dysmenorrhea  . Obesity   . History of wrist fracture     left  . Disturbance of smell and taste 2013    ENT eval (Dr. Bosie Clos) 10/2011 --recommended flonase, afrin, mucinex, saline nasal rinse and then do intracranial imaging if none of that helped in 2 mo.  [name] METABOLIC SYNDROME X 3/54/5625    Qualifier: Diagnosis of  By: Linna Darner MD, Gwyndolyn Saxon   Elevated trigs, HTN, elevated waist circumference.   . Migraine syndrome     topiramate has helped immensely  . Abdominal pain 03/11/2012  . Peripheral neuropathy     Burning/numbness bottoms of feet-saw neurologist, Dr. Delice Lesch, 09/2013.  [name] Chronic low back pain     08/2013 L  spine MRI showed L2-3 DDD and L5-S1 DDD w/out foraminal/nerve encroachment--Dr. Ellene Route did ESI and pt states this was not helpful and also she says they caused her to gain wt.  . Cancer     Past Surgical History  Procedure Laterality Date  . Sinus surgery for septal deviation  1999    & polyps  . Wrist surgery      left  . Knee arthroscopy  04/1988    x2,open procedure for hamstring tendon injury)  . Microdiscectomy lumbar  09/2003    L5/S1 left microdiscectomy (Dr. Patrice Paradise)  . Plantar fascia release  02/2009    Bilateral (Dr. Shellia Carwin)  . Combined hysteroscopy diagnostic / d&c  03/2008    Done for thickened posterior endometrium found on w/u for menorrhagia.  Pathology-benign.  . Anal sphincterotomy  2007    for deep anal fissure (Dr. Brantley Stage)  . Hemorrhoid surgery  2006    Prolapsed internal hemorrhoids (Dr. Brantley Stage)  . 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 Cardiovascular stress test  03/08/14    Normal  . Transthoracic echocardiogram  03/08/14    Normal    There were no vitals filed for this visit.  Visit Diagnosis:  Bilateral low back pain with left-sided sciatica  Hip stiffness, left  Abnormality of gait  Subjective Assessment - 10/24/14 1709    Subjective Pt reports compliance with HEP   Pertinent History Hx of back surgery (12 years ago), fibromyalgia, hx of CA ,Left hamstring rupture with sx reattachment   Currently in Pain? Yes   Pain Score 2    Pain Location Back   Pain Orientation Right;Left   Pain Descriptors / Indicators Aching;Tightness   Pain Type Chronic pain   Multiple Pain Sites No                         [facility] Adult PT Treatment/Exercise - 10/24/14 0001    Exercises   Exercises Lumbar   Lumbar Exercises: Stretches   Active Hamstring Stretch 3 reps;20 seconds  performed supine and sitting   Single Knee to Chest Stretch 3 reps;20 seconds   Piriformis Stretch 3 reps;20 seconds  in supine   Lumbar  Exercises: Aerobic   Stationary Bike Level 1 x 6 minutes   Knee/Hip Exercises: Standing   Other Standing Knee Exercises mini tramp: weight shifting 3 ways x 1 minute   Modalities   Modalities Electrical Stimulation;Moist Heat   Moist Heat Therapy   Number Minutes Moist Heat 15 Minutes   Moist Heat Location Lumbar Spine   Electrical Stimulation   Electrical Stimulation Location lumbar   Electrical Stimulation Action IFC   Electrical Stimulation Parameters 15   Electrical Stimulation Goals Pain                  PT Short Term Goals - 10/22/14 1154    PT SHORT TERM GOAL #1   Title Independent with HEP    Time 4   Period Weeks   Status On-going  independent with initial exercises   PT SHORT TERM GOAL #2   Title Increase SLS time to 20 second bilaterally to improve balance with stair climbing    Time 4   Period Weeks   Status On-going   PT SHORT TERM GOAL #3   Title Improve walking time to 15 minutes without increase in LBP in order to perform errands    Time 4   Period Weeks   Status On-going   PT SHORT TERM GOAL #4   Title Report 20% decrease pain in LBP when doing household activities 4   Time 4   Period Weeks   Status On-going           PT Long Term Goals - 10/17/14 1144    PT LONG TERM GOAL #1   Title Independent with advanced HEP    Time 8   Period Weeks   Status New   PT LONG TERM GOAL #2   Title Reduce FOTO limitation to < or = 48%    Time 8   Period Weeks   Status New   PT LONG TERM GOAL #3   Title Increasing walking ability to 30 minutes to participate in exercise program    Time 8   Period Weeks   Status New   PT LONG TERM GOAL #4   Title Improve SLS to 30 seconds bilaterally to decrease risks for falling when performing ADLs    Time 8   Period Weeks   Status New   PT LONG TERM GOAL #5   Title Verbalize knowledge of doing personal exericse progam for weight loss    Time 8   Period Weeks   Status New  Plan -  10/24/14 1718    Clinical Impression Statement Pt with 2nd session after evaluation and due to chronic hip stifness and history of back surgery no major changes reported. Pt will benfit from PT to improve flexibility,, core strength, balance and pain managment   Pt will benefit from skilled therapeutic intervention in order to improve on the following deficits Decreased endurance;Decreased activity tolerance;Pain;Improper body mechanics;Decreased balance;Decreased mobility;Decreased strength;Impaired sensation   Rehab Potential Good   PT Frequency 2x / week   PT Duration 8 weeks   PT Treatment/Interventions Moist Heat;Traction;Therapeutic activities;Therapeutic exercise;Energy conservation;Balance training;Neuromuscular re-education;Stair training;Functional mobility training;Patient/family education;Passive range of motion;Cryotherapy;Electrical Stimulation;Manual techniques;ADLs/Self Care Home Management   PT Next Visit Plan Begin on bike, try core stabilizing in supine and standing, stair training, SLS at trampoline/foam mat; review HEP; review proper squatting techniques    Consulted and Agree with Plan of Care Patient        Problem List Patient Active Problem List   Diagnosis Date Noted  . Lumbosacral radiculopathy 10/09/2014  . Acute sinus infection 09/29/2014  . SOB (shortness of breath) on exertion 03/03/2014  . Rapid weight gain 03/03/2014  . ECG abnormality 03/03/2014  . Lumbago 02/20/2014  . SOB (shortness of breath) 02/20/2014  . Myalgia and myositis 02/20/2014  . Peripheral edema 02/20/2014  . Subclinical hypothyroidism 02/20/2014  . 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 Neuropathic pain 09/27/2013  . Sinusitis, acute 09/11/2013  . Metatarsalgia 03/31/2013  . Health maintenance examination 03/31/2013  . Urine frequency 12/13/2012  . Non-healing skin lesion of nose 12/13/2012  . Watery eyes 06/16/2012  . Abdominal pain 03/11/2012  . Obesities, morbid 03/08/2012  . Subclinical  hyperthyroidism 12/06/2011  . Weight gain, abnormal 11/11/2011  . 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 Fibromyalgia syndrome 08/05/2011  . Anxiety 07/11/2011  . ABDOMINAL PAIN, EPIGASTRIC 07/17/2010  . Anemia 12/31/2009  . VISUAL ACUITY, DECREASED 12/31/2009  . Other and unspecified hyperlipidemia 11/26/2008  . METABOLIC SYNDROME X 95/01/3266  . NONSPECIFIC ABNORM RESULTS THYROID FUNCT STUDY 11/26/2008  . ALLERGIC RHINITIS 08/29/2008  . ELEVATED DIAPHRAM 08/29/2008  . HYPERSOMNIA, ASSOCIATED WITH SLEEP APNEA 08/29/2008  . Other dyspnea and respiratory abnormality 08/14/2008  . HYPERTENSION, ESSENTIAL NOS 09/23/2007  . GERD 09/23/2007    NAUMANN-HOUEGNIFIO,Jaqualin Serpa PTA 10/24/2014, 5:22 PM  [facility] [address], [place], [place], [address] Phone: [phone]   Fax:  [phone]

## 2014-10-29 ENCOUNTER — Ambulatory Visit: Payer: BC Managed Care – PPO | Admitting: Physical Therapy

## 2014-10-29 ENCOUNTER — Encounter: Payer: Self-pay | Admitting: Physical Therapy

## 2014-10-29 DIAGNOSIS — R269 Unspecified abnormalities of gait and mobility: Secondary | ICD-10-CM

## 2014-10-29 DIAGNOSIS — M25652 Stiffness of left hip, not elsewhere classified: Secondary | ICD-10-CM

## 2014-10-29 DIAGNOSIS — M5442 Lumbago with sciatica, left side: Secondary | ICD-10-CM

## 2014-10-29 NOTE — Therapy (Signed)
[facility] [address] [place], [place], [address] Phone: [phone]   Fax:  [phone]  Physical Therapy Treatment  Patient Details  Name: Sabrina [name] MRN: [id number] Date of Birth: 26-Jan-1961 Referring Provider:  Tammi Sou, MD  Encounter Date: 10/29/2014      PT End of Session - 10/29/14 1319    Visit Number 4   Date for PT Re-Evaluation 12/12/14   PT Start Time 1238   PT Stop Time 1330   PT Time Calculation (min) 52 min   Activity Tolerance Patient tolerated treatment well   Behavior During Therapy [facility] for tasks assessed/performed      Past Medical History  Diagnosis Date  . GERD (gastroesophageal reflux disease)   . Hypertension   . Allergy     rhinitis  . History of cervical cancer     Dr. Irven Baltimore (carcinoma in situ): Laser and cone bx 1993, margins neg, paps wnl since.  . Hyperlipidemia     NMR 03/2009.Marland KitchenLDL 161(2735/1887)HDL 37,TG 271  . Subclinical hyperthyroidism 06/19/11    TSH .29 at GYN (T4 and T3 wnl).  Repeat 07/21/11 wnl  . Sleep apnea     Dr. Elsworth Soho: CPAP 10 cm H2O with full facemask as per titration study 03/21/12  . Chronic fatigue     +excessive daytime somnolence  . Fibromyalgia syndrome   . Dysfunctional uterine bleeding     Metromenorrhagia+dysmenorrhea  . Obesity   . History of wrist fracture     left  . Disturbance of smell and taste 2013    ENT eval (Dr. Bosie Clos) 10/2011 --recommended flonase, afrin, mucinex, saline nasal rinse and then do intracranial imaging if none of that helped in 2 mo.  [name] METABOLIC SYNDROME X 2/59/5638    Qualifier: Diagnosis of  By: Linna Darner MD, Gwyndolyn Saxon   Elevated trigs, HTN, elevated waist circumference.   . Migraine syndrome     topiramate has helped immensely  . Abdominal pain 03/11/2012  . Peripheral neuropathy     Burning/numbness bottoms of feet-saw neurologist, Dr. Delice Lesch, 09/2013.  [name] Chronic low back pain     08/2013 L  spine MRI showed L2-3 DDD and L5-S1 DDD w/out foraminal/nerve encroachment--Dr. Ellene Route did ESI and pt states this was not helpful and also she says they caused her to gain wt.  . Cancer     Past Surgical History  Procedure Laterality Date  . Sinus surgery for septal deviation  1999    & polyps  . Wrist surgery      left  . Knee arthroscopy  04/1988    x2,open procedure for hamstring tendon injury)  . Microdiscectomy lumbar  09/2003    L5/S1 left microdiscectomy (Dr. Patrice Paradise)  . Plantar fascia release  02/2009    Bilateral (Dr. Shellia Carwin)  . Combined hysteroscopy diagnostic / d&c  03/2008    Done for thickened posterior endometrium found on w/u for menorrhagia.  Pathology-benign.  . Anal sphincterotomy  2007    for deep anal fissure (Dr. Brantley Stage)  . Hemorrhoid surgery  2006    Prolapsed internal hemorrhoids (Dr. Brantley Stage)  . 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 Cardiovascular stress test  03/08/14    Normal  . Transthoracic echocardiogram  03/08/14    Normal    There were no vitals filed for this visit.  Visit Diagnosis:  Bilateral low back pain with left-sided sciatica  Hip stiffness, left  Abnormality of gait  Subjective Assessment - 10/29/14 1247    Subjective Pt reports pain in low back and neck   Currently in Pain? Yes   Pain Score 6    Pain Location Back   Pain Orientation Lower;Right;Left   Pain Descriptors / Indicators Tightness;Stabbing;Aching   Pain Type Chronic pain   Pain Onset More than a month ago   Pain Frequency Intermittent   Multiple Pain Sites Yes   Pain Score 3   Pain Location Neck   Pain Orientation Right;Left   Pain Descriptors / Indicators Aching   Pain Type Chronic pain   Pain Onset More than a month ago   Pain Frequency Intermittent   Aggravating Factors  to much activities and after night sleep   Pain Relieving Factors meds -oxicodin heat and ice   Effect of Pain on Daily Activities limits daily activities                          [facility] Adult PT Treatment/Exercise - 10/29/14 0001    Exercises   Exercises Lumbar   Lumbar Exercises: Stretches   Active Hamstring Stretch 3 reps;20 seconds  in supine while on HMP   Single Knee to Chest Stretch 3 reps;20 seconds  using towel, on HMP   Double Knee to Chest Stretch 3 reps;20 seconds  using towel, on HMP   Lower Trunk Rotation 3 reps;20 seconds  on HMP   Piriformis Stretch 3 reps;20 seconds  in supine using towel   Lumbar Exercises: Aerobic   UBE (Upper Arm Bike) L 2  49min (3/3)   Lumbar Exercises: Prone   Other Prone Lumbar Exercises childpose x 3 with 20 sec hold   Modalities   Modalities Electrical Stimulation;Moist Heat   Moist Heat Therapy   Number Minutes Moist Heat 15 Minutes   Moist Heat Location Lumbar Spine   Electrical Stimulation   Electrical Stimulation Location lumbar   Electrical Stimulation Action IFC   Electrical Stimulation Parameters 15   Electrical Stimulation Goals Pain                  PT Short Term Goals - 10/29/14 1322    PT SHORT TERM GOAL #1   Title Independent with HEP    Time 4   Period Weeks   Status On-going   PT SHORT TERM GOAL #2   Title Increase SLS time to 20 second bilaterally to improve balance with stair climbing    Time 4   Period Weeks   Status On-going   PT SHORT TERM GOAL #3   Title Improve walking time to 15 minutes without increase in LBP in order to perform errands    Time 4   Period Weeks   Status On-going   PT SHORT TERM GOAL #4   Title Report 20% decrease pain in LBP when doing household activities 4   Time 4   Period Weeks   Status On-going           PT Long Term Goals - 10/29/14 1323    PT LONG TERM GOAL #1   Title Independent with advanced HEP    Time 8   Period Weeks   Status On-going   PT LONG TERM GOAL #2   Title Reduce FOTO limitation to < or = 48%    Time 8   Period Weeks   Status On-going   PT LONG TERM GOAL #3   Title  Increasing walking ability to 30  minutes to participate in exercise program    Time 8   Period Weeks   Status On-going   PT LONG TERM GOAL #4   Title Improve SLS to 30 seconds bilaterally to decrease risks for falling when performing ADLs    Time 8   Period Weeks   Status On-going   PT LONG TERM GOAL #5   Title Verbalize knowledge of doing personal exericse progam for weight loss    Time 8   Period Weeks   Status On-going               Plan - 10/29/14 1320    Clinical Impression Statement Pt feels very tight n low back today, and stretching was beneficial. Pt will continue to benefit from skilled PT to improve flexibility, core strength, balance and pain managment   Pt will benefit from skilled therapeutic intervention in order to improve on the following deficits Decreased endurance;Decreased activity tolerance;Pain;Improper body mechanics;Decreased balance;Decreased mobility;Decreased strength;Impaired sensation   Rehab Potential Good   PT Frequency 2x / week   PT Duration 8 weeks   PT Treatment/Interventions Moist Heat;Traction;Therapeutic activities;Therapeutic exercise;Energy conservation;Balance training;Neuromuscular re-education;Stair training;Functional mobility training;Patient/family education;Passive range of motion;Cryotherapy;Electrical Stimulation;Manual techniques;ADLs/Self Care Home Management   PT Next Visit Plan Begin on bike, try core stabilizing in supine and standing, stair training, SLS at trampoline/foam mat; review HEP; review proper squatting techniques    Consulted and Agree with Plan of Care Patient        Problem List Patient Active Problem List   Diagnosis Date Noted  . Lumbosacral radiculopathy 10/09/2014  . Acute sinus infection 09/29/2014  . SOB (shortness of breath) on exertion 03/03/2014  . Rapid weight gain 03/03/2014  . ECG abnormality 03/03/2014  . Lumbago 02/20/2014  . SOB (shortness of breath) 02/20/2014  . Myalgia and myositis  02/20/2014  . Peripheral edema 02/20/2014  . Subclinical hypothyroidism 02/20/2014  . 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 Neuropathic pain 09/27/2013  . Sinusitis, acute 09/11/2013  . Metatarsalgia 03/31/2013  . Health maintenance examination 03/31/2013  . Urine frequency 12/13/2012  . Non-healing skin lesion of nose 12/13/2012  . Watery eyes 06/16/2012  . Abdominal pain 03/11/2012  . Obesities, morbid 03/08/2012  . Subclinical hyperthyroidism 12/06/2011  . Weight gain, abnormal 11/11/2011  . 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 Fibromyalgia syndrome 08/05/2011  . Anxiety 07/11/2011  . ABDOMINAL PAIN, EPIGASTRIC 07/17/2010  . Anemia 12/31/2009  . VISUAL ACUITY, DECREASED 12/31/2009  . Other and unspecified hyperlipidemia 11/26/2008  . METABOLIC SYNDROME X 03/88/8280  . NONSPECIFIC ABNORM RESULTS THYROID FUNCT STUDY 11/26/2008  . ALLERGIC RHINITIS 08/29/2008  . ELEVATED DIAPHRAM 08/29/2008  . HYPERSOMNIA, ASSOCIATED WITH SLEEP APNEA 08/29/2008  . Other dyspnea and respiratory abnormality 08/14/2008  . HYPERTENSION, ESSENTIAL NOS 09/23/2007  . GERD 09/23/2007    NAUMANN-HOUEGNIFIO,Keshav Winegar PTA 10/29/2014, 1:27 PM  [facility] [address] [place], [place], [address] Phone: [phone]   Fax:  [phone]

## 2014-10-31 ENCOUNTER — Ambulatory Visit: Payer: BC Managed Care – PPO

## 2014-10-31 DIAGNOSIS — M5442 Lumbago with sciatica, left side: Secondary | ICD-10-CM

## 2014-10-31 DIAGNOSIS — M25652 Stiffness of left hip, not elsewhere classified: Secondary | ICD-10-CM

## 2014-10-31 NOTE — Therapy (Signed)
[facility] [address] [place], [place], [address] Phone: [phone]   Fax:  [phone]  Physical Therapy Treatment  Patient Details  Name: Sabrina [name] MRN: [id number] Date of Birth: Jul 19, 1960 Referring Provider:  Tammi Sou, MD  Encounter Date: 10/31/2014      PT End of Session - 10/31/14 1117    Visit Number 5   Date for PT Re-Evaluation 12/12/14   PT Start Time 1111   PT Stop Time 1205   PT Time Calculation (min) 54 min   Activity Tolerance Patient tolerated treatment well   Behavior During Therapy [facility] for tasks assessed/performed      Past Medical History  Diagnosis Date  . GERD (gastroesophageal reflux disease)   . Hypertension   . Allergy     rhinitis  . History of cervical cancer     Dr. Irven Baltimore (carcinoma in situ): Laser and cone bx 1993, margins neg, paps wnl since.  . Hyperlipidemia     NMR 03/2009.Marland KitchenLDL 161(2735/1887)HDL 37,TG 271  . Subclinical hyperthyroidism 06/19/11    TSH .29 at GYN (T4 and T3 wnl).  Repeat 07/21/11 wnl  . Sleep apnea     Dr. Elsworth Soho: CPAP 10 cm H2O with full facemask as per titration study 03/21/12  . Chronic fatigue     +excessive daytime somnolence  . Fibromyalgia syndrome   . Dysfunctional uterine bleeding     Metromenorrhagia+dysmenorrhea  . Obesity   . History of wrist fracture     left  . Disturbance of smell and taste 2013    ENT eval (Dr. Bosie Clos) 10/2011 --recommended flonase, afrin, mucinex, saline nasal rinse and then do intracranial imaging if none of that helped in 2 mo.  [name] METABOLIC SYNDROME X 10/29/3660    Qualifier: Diagnosis of  By: Linna Darner MD, Gwyndolyn Saxon   Elevated trigs, HTN, elevated waist circumference.   . Migraine syndrome     topiramate has helped immensely  . Abdominal pain 03/11/2012  . Peripheral neuropathy     Burning/numbness bottoms of feet-saw neurologist, Dr. Delice Lesch, 09/2013.  [name] Chronic low back pain     08/2013 L  spine MRI showed L2-3 DDD and L5-S1 DDD w/out foraminal/nerve encroachment--Dr. Ellene Route did ESI and pt states this was not helpful and also she says they caused her to gain wt.  . Cancer     Past Surgical History  Procedure Laterality Date  . Sinus surgery for septal deviation  1999    & polyps  . Wrist surgery      left  . Knee arthroscopy  04/1988    x2,open procedure for hamstring tendon injury)  . Microdiscectomy lumbar  09/2003    L5/S1 left microdiscectomy (Dr. Patrice Paradise)  . Plantar fascia release  02/2009    Bilateral (Dr. Shellia Carwin)  . Combined hysteroscopy diagnostic / d&c  03/2008    Done for thickened posterior endometrium found on w/u for menorrhagia.  Pathology-benign.  . Anal sphincterotomy  2007    for deep anal fissure (Dr. Brantley Stage)  . Hemorrhoid surgery  2006    Prolapsed internal hemorrhoids (Dr. Brantley Stage)  . 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 Cardiovascular stress test  03/08/14    Normal  . Transthoracic echocardiogram  03/08/14    Normal    There were no vitals filed for this visit.  Visit Diagnosis:  Bilateral low back pain with left-sided sciatica  Hip stiffness, left  Subjective Assessment - 10/31/14 1114    Subjective Felt a sharp pain in back yesterday sitting on the couch. Had increase in pain after doing household chores (cooking, cleaning) activities    Pertinent History Hx of back surgery (12 years ago), fibromyalgia, hx of CA ,Left hamstring rupture with sx reattachment   How long can you stand comfortably? 5-6 minutes    Patient Stated Goals Walking for 30 minutes for exercise, reduce pain, to be able to do household activities without pain    Currently in Pain? Yes   Pain Score 3    Aggravating Factors  household cleaning activities    Pain Relieving Factors rest, stretches, medicine                          [facility] Adult PT Treatment/Exercise - 10/31/14 0001    Lumbar Exercises: Stretches    Standing Extension 5 reps  Hold for 3; did not alleviate pain    Lumbar Exercises: Aerobic   Stationary Bike L1 for 6 minjutes    Lumbar Exercises: Machines for Strengthening   Other Lumbar Machine Exercise Standing multifidi stabilzation; 20#    Lumbar Exercises: Standing   Heel Raises --   Other Standing Lumbar Exercises Horizontal abduction 3x10 with pool noodle behind   unable to perform due to pain with standing   Lumbar Exercises: Seated   Other Seated Lumbar Exercises Pelvic circles on green ball; 1 min each direction    Lumbar Exercises: Supine   Other Supine Lumbar Exercises pelvic tilt x 20   Moist Heat Therapy   Number Minutes Moist Heat 15 Minutes   Moist Heat Location Lumbar Spine   Electrical Stimulation   Electrical Stimulation Location lumbar   Electrical Stimulation Action IFC   Electrical Stimulation Parameters 15   Electrical Stimulation Goals Pain                  PT Short Term Goals - 10/29/14 1322    PT SHORT TERM GOAL #1   Title Independent with HEP    Time 4   Period Weeks   Status On-going   PT SHORT TERM GOAL #2   Title Increase SLS time to 20 second bilaterally to improve balance with stair climbing    Time 4   Period Weeks   Status On-going   PT SHORT TERM GOAL #3   Title Improve walking time to 15 minutes without increase in LBP in order to perform errands    Time 4   Period Weeks   Status On-going   PT SHORT TERM GOAL #4   Title Report 20% decrease pain in LBP when doing household activities 4   Time 4   Period Weeks   Status On-going           PT Long Term Goals - 10/29/14 1323    PT LONG TERM GOAL #1   Title Independent with advanced HEP    Time 8   Period Weeks   Status On-going   PT LONG TERM GOAL #2   Title Reduce FOTO limitation to < or = 48%    Time 8   Period Weeks   Status On-going   PT LONG TERM GOAL #3   Title Increasing walking ability to 30 minutes to participate in exercise program    Time 8    Period Weeks   Status On-going   PT LONG TERM GOAL #4   Title Improve SLS  to 30 seconds bilaterally to decrease risks for falling when performing ADLs    Time 8   Period Weeks   Status On-going   PT LONG TERM GOAL #5   Title Verbalize knowledge of doing personal exericse progam for weight loss    Time 8   Period Weeks   Status On-going               Plan - 10/31/14 1200    Clinical Impression Statement Felt increased pain in low back today; potentially due to new medication for CA and increased cleaning activity at home. Tolerated core strengthening/stabilization exercises well today; increased pain with standing exercises and with increased back extension. Will benefit from skilled PT for continued core strengthening , postural re-education and  pain management.    Pt will benefit from skilled therapeutic intervention in order to improve on the following deficits Decreased endurance;Decreased activity tolerance;Pain;Improper body mechanics;Decreased balance;Decreased mobility;Decreased strength;Impaired sensation   Rehab Potential Good   PT Frequency 2x / week   PT Duration 8 weeks   PT Treatment/Interventions Moist Heat;Traction;Therapeutic activities;Therapeutic exercise;Energy conservation;Balance training;Neuromuscular re-education;Stair training;Functional mobility training;Patient/family education;Passive range of motion;Cryotherapy;Electrical Stimulation;Manual techniques;ADLs/Self Care Home Management   PT Next Visit Plan Work on standing balance, continue seated core stabilization exercise, pelvic tilts in supine, TA activiation, SLS on trampoline, exercise ball exercises    Consulted and Agree with Plan of Care Patient        Problem List Patient Active Problem List   Diagnosis Date Noted  . Lumbosacral radiculopathy 10/09/2014  . Acute sinus infection 09/29/2014  . SOB (shortness of breath) on exertion 03/03/2014  . Rapid weight gain 03/03/2014  . ECG  abnormality 03/03/2014  . Lumbago 02/20/2014  . SOB (shortness of breath) 02/20/2014  . Myalgia and myositis 02/20/2014  . Peripheral edema 02/20/2014  . Subclinical hypothyroidism 02/20/2014  . 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 Neuropathic pain 09/27/2013  . Sinusitis, acute 09/11/2013  . Metatarsalgia 03/31/2013  . Health maintenance examination 03/31/2013  . Urine frequency 12/13/2012  . Non-healing skin lesion of nose 12/13/2012  . Watery eyes 06/16/2012  . Abdominal pain 03/11/2012  . Obesities, morbid 03/08/2012  . Subclinical hyperthyroidism 12/06/2011  . Weight gain, abnormal 11/11/2011  . 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 Fibromyalgia syndrome 08/05/2011  . Anxiety 07/11/2011  . ABDOMINAL PAIN, EPIGASTRIC 07/17/2010  . Anemia 12/31/2009  . VISUAL ACUITY, DECREASED 12/31/2009  . Other and unspecified hyperlipidemia 11/26/2008  . METABOLIC SYNDROME X 40/02/2724  . NONSPECIFIC ABNORM RESULTS THYROID FUNCT STUDY 11/26/2008  . ALLERGIC RHINITIS 08/29/2008  . ELEVATED DIAPHRAM 08/29/2008  . HYPERSOMNIA, ASSOCIATED WITH SLEEP APNEA 08/29/2008  . Other dyspnea and respiratory abnormality 08/14/2008  . HYPERTENSION, ESSENTIAL NOS 09/23/2007  . GERD 09/23/2007  Reginal Lutes, SPT 10/31/2014 12:44 PM During this treatment session, the therapist was present, participating in, and directing the treatment. TAKACS,KELLY 10/31/2014, 12:43 PM  [facility] [address] [place], [place], [address] Phone: [phone]   Fax:  [phone]

## 2014-11-07 ENCOUNTER — Encounter: Payer: Self-pay | Admitting: Physical Therapy

## 2014-11-07 ENCOUNTER — Ambulatory Visit: Payer: BC Managed Care – PPO | Attending: Neurology | Admitting: Physical Therapy

## 2014-11-07 DIAGNOSIS — R269 Unspecified abnormalities of gait and mobility: Secondary | ICD-10-CM | POA: Diagnosis present

## 2014-11-07 DIAGNOSIS — M5442 Lumbago with sciatica, left side: Secondary | ICD-10-CM | POA: Insufficient documentation

## 2014-11-07 DIAGNOSIS — M25652 Stiffness of left hip, not elsewhere classified: Secondary | ICD-10-CM | POA: Diagnosis present

## 2014-11-07 NOTE — Therapy (Signed)
[facility] [address], [place], [place], [address] Phone: [phone]   Fax:  [phone]  Physical Therapy Treatment  Patient Details  Name: Sabrina [name] MRN: [id number] Date of Birth: [birth year] Referring Provider:  Tammi Sou, MD  Encounter Date: 11/07/2014      PT End of Session - 11/07/14 1038    Visit Number 6   Date for PT Re-Evaluation 12/12/14   PT Start Time 1022   PT Stop Time 1117   PT Time Calculation (min) 55 min   Activity Tolerance Patient tolerated treatment well   Behavior During Therapy [facility] for tasks assessed/performed      Past Medical History  Diagnosis Date  . GERD (gastroesophageal reflux disease)   . Hypertension   . Allergy     rhinitis  . History of cervical cancer     Dr. Irven Baltimore (carcinoma in situ): Laser and cone bx 1993, margins neg, paps wnl since.  . Hyperlipidemia     NMR 03/2009.Marland KitchenLDL 161(2735/1887)HDL 37,TG 271  . Subclinical hyperthyroidism 06/19/11    TSH .29 at GYN (T4 and T3 wnl).  Repeat 07/21/11 wnl  . Sleep apnea     Dr. Elsworth Soho: CPAP 10 cm H2O with full facemask as per titration study 03/21/12  . Chronic fatigue     +excessive daytime somnolence  . Fibromyalgia syndrome   . Dysfunctional uterine bleeding     Metromenorrhagia+dysmenorrhea  . Obesity   . History of wrist fracture     left  . Disturbance of smell and taste 2013    ENT eval (Dr. Bosie Clos) 10/2011 --recommended flonase, afrin, mucinex, saline nasal rinse and then do intracranial imaging if none of that helped in 2 mo.  [name] METABOLIC SYNDROME X 8/45/3646    Qualifier: Diagnosis of  By: Linna Darner MD, Gwyndolyn Saxon   Elevated trigs, HTN, elevated waist circumference.   . Migraine syndrome     topiramate has helped immensely  . Abdominal pain 03/11/2012  . Peripheral neuropathy     Burning/numbness bottoms of feet-saw neurologist, Dr. Delice Lesch, 09/2013.  [name] Chronic low back pain     08/2013 L  spine MRI showed L2-3 DDD and L5-S1 DDD w/out foraminal/nerve encroachment--Dr. Ellene Route did ESI and pt states this was not helpful and also she says they caused her to gain wt.  . Cancer     Past Surgical History  Procedure Laterality Date  . Sinus surgery for septal deviation  1999    & polyps  . Wrist surgery      left  . Knee arthroscopy  04/1988    x2,open procedure for hamstring tendon injury)  . Microdiscectomy lumbar  09/2003    L5/S1 left microdiscectomy (Dr. Patrice Paradise)  . Plantar fascia release  02/2009    Bilateral (Dr. Shellia Carwin)  . Combined hysteroscopy diagnostic / d&c  03/2008    Done for thickened posterior endometrium found on w/u for menorrhagia.  Pathology-benign.  . Anal sphincterotomy  2007    for deep anal fissure (Dr. Brantley Stage)  . Hemorrhoid surgery  2006    Prolapsed internal hemorrhoids (Dr. Brantley Stage)  . 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 Cardiovascular stress test  03/08/14    Normal  . Transthoracic echocardiogram  03/08/14    Normal    There were no vitals filed for this visit.  Visit Diagnosis:  Bilateral low back pain with left-sided sciatica  Hip stiffness, left  Abnormality of gait  Subjective Assessment - 11/07/14 1025    Subjective Yesterday was bad, had to travel for 2 hours x 2  in the car what increased back pain.    Currently in Pain? Yes   Pain Score 3   yesterday 8/10   Pain Location Back   Pain Orientation Right;Left;Lower   Pain Descriptors / Indicators Tightness;Stabbing;Aching   Pain Type Chronic pain   Pain Onset More than a month ago   Pain Frequency Intermittent   Multiple Pain Sites Yes   Pain Score 1   Pain Location Neck   Pain Orientation Right;Left   Pain Descriptors / Indicators Aching   Pain Type Chronic pain   Pain Onset More than a month ago   Pain Frequency Intermittent                         [facility] Adult PT Treatment/Exercise - 11/07/14 0001    Exercises    Exercises Lumbar   Lumbar Exercises: Aerobic   Stationary Bike Seat#10, L1 for 6 min    UBE (Upper Arm Bike) L 2  45min (3/3) sitting on green ball   Lumbar Exercises: Machines for Strengthening   Other Lumbar Machine Exercise Standing multifidi stabilzation; 30#   while walking backward   Other Lumbar Machine Exercise Rows 30# 3 x10, with pelvic floor contraction   Lumbar Exercises: Seated   Other Seated Lumbar Exercises Pelvic ant/post & circles on green ball; 1 min each direction    Lumbar Exercises: Supine   Other Supine Lumbar Exercises pelvic tilt x 20  x 1 min   Other Supine Lumbar Exercises TA activation: baseline x 10 with 5 sec hold, added unilateral leg movement x 3 each   Modalities   Modalities Electrical Stimulation   Moist Heat Therapy   Number Minutes Moist Heat 15 Minutes   Moist Heat Location Lumbar Spine   Electrical Stimulation   Electrical Stimulation Location lumbar   Electrical Stimulation Action IFC   Electrical Stimulation Parameters 15   Electrical Stimulation Goals Pain                PT Education - 11/07/14 1110    Education provided Yes   Education Details TA activation   Person(s) Educated Patient   Methods Explanation;Demonstration   Comprehension Verbalized understanding;Returned demonstration          PT Short Term Goals - 11/07/14 1046    PT SHORT TERM GOAL #1   Title Independent with HEP    Time 4   Period Weeks   Status On-going   PT SHORT TERM GOAL #2   Title Increase SLS time to 20 second bilaterally to improve balance with stair climbing    Time 4   Period Weeks   Status On-going   PT SHORT TERM GOAL #3   Title Improve walking time to 15 minutes without increase in LBP in order to perform errands    Time 4   Period Weeks   Status On-going   PT SHORT TERM GOAL #4   Title Report 20% decrease pain in LBP when doing household activities 4   Time 4   Period Weeks   Status On-going           PT Long Term Goals -  11/07/14 1048    PT LONG TERM GOAL #1   Title Independent with advanced HEP    Time 8   Period Weeks   Status On-going  PT LONG TERM GOAL #2   Title Reduce FOTO limitation to < or = 48%    Time 8   Period Weeks   Status On-going   PT LONG TERM GOAL #3   Title Increasing walking ability to 30 minutes to participate in exercise program    Time 8   Period Weeks   Status On-going   PT LONG TERM GOAL #4   Title Improve SLS to 30 seconds bilaterally to decrease risks for falling when performing ADLs    Time 8   Period Weeks   Status On-going   PT LONG TERM GOAL #5   Title Verbalize knowledge of doing personal exericse progam for weight loss    Time 8   Period Weeks   Status On-going               Plan - 11/07/14 1039    Clinical Impression Statement Pt able to tolerate activities in PT well, Pt will continue to benefit from skilled PT for continued core strengthening, postural re-education and pain managment   Rehab Potential Good   PT Frequency 2x / week   PT Duration 8 weeks   PT Next Visit Plan Continue with TA activation, balance, SLS on trampoline, ball exercises   Consulted and Agree with Plan of Care Patient        Problem List Patient Active Problem List   Diagnosis Date Noted  . Lumbosacral radiculopathy 10/09/2014  . Acute sinus infection 09/29/2014  . SOB (shortness of breath) on exertion 03/03/2014  . Rapid weight gain 03/03/2014  . ECG abnormality 03/03/2014  . Lumbago 02/20/2014  . SOB (shortness of breath) 02/20/2014  . Myalgia and myositis 02/20/2014  . Peripheral edema 02/20/2014  . Subclinical hypothyroidism 02/20/2014  . 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 Neuropathic pain 09/27/2013  . Sinusitis, acute 09/11/2013  . Metatarsalgia 03/31/2013  . Health maintenance examination 03/31/2013  . Urine frequency 12/13/2012  . Non-healing skin lesion of nose 12/13/2012  . Watery eyes 06/16/2012  . Abdominal pain 03/11/2012  . Obesities, morbid  03/08/2012  . Subclinical hyperthyroidism 12/06/2011  . Weight gain, abnormal 11/11/2011  . 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 Fibromyalgia syndrome 08/05/2011  . Anxiety 07/11/2011  . ABDOMINAL PAIN, EPIGASTRIC 07/17/2010  . Anemia 12/31/2009  . VISUAL ACUITY, DECREASED 12/31/2009  . Other and unspecified hyperlipidemia 11/26/2008  . METABOLIC SYNDROME X 59/16/3846  . NONSPECIFIC ABNORM RESULTS THYROID FUNCT STUDY 11/26/2008  . ALLERGIC RHINITIS 08/29/2008  . ELEVATED DIAPHRAM 08/29/2008  . HYPERSOMNIA, ASSOCIATED WITH SLEEP APNEA 08/29/2008  . Other dyspnea and respiratory abnormality 08/14/2008  . HYPERTENSION, ESSENTIAL NOS 09/23/2007  . GERD 09/23/2007    [name],Sabrina Pascal PTA 11/07/2014, 11:13 AM  [facility] [address] [place], [place], [address] Phone: [phone]   Fax:  [phone]

## 2014-11-07 NOTE — Patient Instructions (Signed)

## 2014-11-09 ENCOUNTER — Encounter: Payer: BC Managed Care – PPO | Admitting: Physical Therapy

## 2014-11-13 ENCOUNTER — Ambulatory Visit: Payer: BC Managed Care – PPO

## 2014-11-13 DIAGNOSIS — R269 Unspecified abnormalities of gait and mobility: Secondary | ICD-10-CM

## 2014-11-13 DIAGNOSIS — M5442 Lumbago with sciatica, left side: Secondary | ICD-10-CM | POA: Diagnosis not present

## 2014-11-13 DIAGNOSIS — M25652 Stiffness of left hip, not elsewhere classified: Secondary | ICD-10-CM

## 2014-11-13 NOTE — Therapy (Signed)
[facility] [address] [place], [place], [address] Phone: [phone]   Fax:  [phone]  Physical Therapy Treatment  Patient Details  Name: Sabrina [name] MRN: [id number] Date of Birth: [birth year] Referring Provider:  Cameron Sprang, MD  Encounter Date: 11/13/2014      PT End of Session - 11/13/14 1029    Visit Number 7   Date for PT Re-Evaluation 12/12/14   PT Start Time 1020   PT Stop Time 1111   PT Time Calculation (min) 51 min   Activity Tolerance Patient tolerated treatment well   Behavior During Therapy [facility] for tasks assessed/performed      Past Medical History  Diagnosis Date  . GERD (gastroesophageal reflux disease)   . Hypertension   . Allergy     rhinitis  . History of cervical cancer     Dr. Irven Baltimore (carcinoma in situ): Laser and cone bx 1993, margins neg, paps wnl since.  . Hyperlipidemia     NMR 03/2009.Marland KitchenLDL 161(2735/1887)HDL 37,TG 271  . Subclinical hyperthyroidism 06/19/11    TSH .29 at GYN (T4 and T3 wnl).  Repeat 07/21/11 wnl  . Sleep apnea     Dr. Elsworth Soho: CPAP 10 cm H2O with full facemask as per titration study 03/21/12  . Chronic fatigue     +excessive daytime somnolence  . Fibromyalgia syndrome   . Dysfunctional uterine bleeding     Metromenorrhagia+dysmenorrhea  . Obesity   . History of wrist fracture     left  . Disturbance of smell and taste 2013    ENT eval (Dr. Bosie Clos) 10/2011 --recommended flonase, afrin, mucinex, saline nasal rinse and then do intracranial imaging if none of that helped in 2 mo.  [name] METABOLIC SYNDROME X 3/42/8768    Qualifier: Diagnosis of  By: Linna Darner MD, Gwyndolyn Saxon   Elevated trigs, HTN, elevated waist circumference.   . Migraine syndrome     topiramate has helped immensely  . Abdominal pain 03/11/2012  . Peripheral neuropathy     Burning/numbness bottoms of feet-saw neurologist, Dr. Delice Lesch, 09/2013.  [name] Chronic low back pain     08/2013 L  spine MRI showed L2-3 DDD and L5-S1 DDD w/out foraminal/nerve encroachment--Dr. Ellene Route did ESI and pt states this was not helpful and also she says they caused her to gain wt.  . Cancer     Past Surgical History  Procedure Laterality Date  . Sinus surgery for septal deviation  1999    & polyps  . Wrist surgery      left  . Knee arthroscopy  04/1988    x2,open procedure for hamstring tendon injury)  . Microdiscectomy lumbar  09/2003    L5/S1 left microdiscectomy (Dr. Patrice Paradise)  . Plantar fascia release  02/2009    Bilateral (Dr. Shellia Carwin)  . Combined hysteroscopy diagnostic / d&c  03/2008    Done for thickened posterior endometrium found on w/u for menorrhagia.  Pathology-benign.  . Anal sphincterotomy  2007    for deep anal fissure (Dr. Brantley Stage)  . Hemorrhoid surgery  2006    Prolapsed internal hemorrhoids (Dr. Brantley Stage)  . 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 Cardiovascular stress test  03/08/14    Normal  . Transthoracic echocardiogram  03/08/14    Normal    There were no vitals filed for this visit.  Visit Diagnosis:  Bilateral low back pain with left-sided sciatica  Hip stiffness, left  Abnormality of gait  Subjective Assessment - 11/13/14 1411    Subjective Had increased pain in low back after performing core stabilization/pelvic tilt exercises at home; felt extremely sore in all positions today.    Pertinent History Hx of back surgery (12 years ago), fibromyalgia, hx of CA ,Left hamstring rupture with sx reattachment                         [facility] Adult PT Treatment/Exercise - 11/13/14 0001    Lumbar Exercises: Stretches   Active Hamstring Stretch 3 reps;20 seconds  in supine while on HMP   Passive Hamstring Stretch 3 reps;20 seconds  green strap    Lower Trunk Rotation 3 reps;20 seconds  cueing to keep back flat on the mat    Piriformis Stretch 3 reps;20 seconds  seated   Lumbar Exercises: Aerobic   Stationary Bike  L1 for 6 minjutes    Lumbar Exercises: Standing   Other Standing Lumbar Exercises --   Lumbar Exercises: Supine   Other Supine Lumbar Exercises TA activation x 3   Reviewed HEP; required cueing to keep back flat   Knee/Hip Exercises: Stretches   Active Hamstring Stretch 2 reps   Knee/Hip Exercises: Standing   Functional Squat 20 reps  VC for appropriate form; chair for ext. cue   Moist Heat Therapy   Number Minutes Moist Heat 15 Minutes   Moist Heat Location Lumbar Spine   Electrical Stimulation   Electrical Stimulation Location lumbar   Electrical Stimulation Action IFC   Electrical Stimulation Parameters 15   Electrical Stimulation Goals Pain                  PT Short Term Goals - 11/07/14 1046    PT SHORT TERM GOAL #1   Title Independent with HEP    Time 4   Period Weeks   Status On-going   PT SHORT TERM GOAL #2   Title Increase SLS time to 20 second bilaterally to improve balance with stair climbing    Time 4   Period Weeks   Status On-going   PT SHORT TERM GOAL #3   Title Improve walking time to 15 minutes without increase in LBP in order to perform errands    Time 4   Period Weeks   Status On-going   PT SHORT TERM GOAL #4   Title Report 20% decrease pain in LBP when doing household activities 4   Time 4   Period Weeks   Status On-going           PT Long Term Goals - 11/07/14 1048    PT LONG TERM GOAL #1   Title Independent with advanced HEP    Time 8   Period Weeks   Status On-going   PT LONG TERM GOAL #2   Title Reduce FOTO limitation to < or = 48%    Time 8   Period Weeks   Status On-going   PT LONG TERM GOAL #3   Title Increasing walking ability to 30 minutes to participate in exercise program    Time 8   Period Weeks   Status On-going   PT LONG TERM GOAL #4   Title Improve SLS to 30 seconds bilaterally to decrease risks for falling when performing ADLs    Time 8   Period Weeks   Status On-going   PT LONG TERM GOAL #5   Title  Verbalize knowledge of doing personal exericse progam for weight loss  Time 8   Period Weeks   Status On-going               Plan - 11/13/14 1414    Clinical Impression Statement LBP extremely irritated today after doing HEP/activites over the weekend. Performed gentle stretches and core stabilization exericses to decrease pain. Practiced squatting exercises and reviewed proper body mechanics. Will required continued skilled PT for LE/core strengthening, postural strengthening, pain management and body mechanics review.   Pt will benefit from skilled therapeutic intervention in order to improve on the following deficits Decreased endurance;Decreased activity tolerance;Pain;Improper body mechanics;Decreased mobility;Decreased strength;Impaired sensation   Rehab Potential Good   PT Frequency 2x / week   PT Duration 8 weeks   PT Treatment/Interventions Moist Heat;Traction;Therapeutic activities;Therapeutic exercise;Energy conservation;Balance training;Neuromuscular re-education;Stair training;Functional mobility training;Patient/family education;Passive range of motion;Cryotherapy;Electrical Stimulation;Manual techniques;ADLs/Self Care Home Management   PT Next Visit Plan Ball exercises, standing exercises, try deep breathing exercises, practcie squats with proper form.  Check goals.   Consulted and Agree with Plan of Care Patient        Problem List Patient Active Problem List   Diagnosis Date Noted  . Lumbosacral radiculopathy 10/09/2014  . Acute sinus infection 09/29/2014  . SOB (shortness of breath) on exertion 03/03/2014  . Rapid weight gain 03/03/2014  . ECG abnormality 03/03/2014  . Lumbago 02/20/2014  . SOB (shortness of breath) 02/20/2014  . Myalgia and myositis 02/20/2014  . Peripheral edema 02/20/2014  . Subclinical hypothyroidism 02/20/2014  . 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 Neuropathic pain 09/27/2013  . Sinusitis, acute 09/11/2013  . Metatarsalgia 03/31/2013  .  Health maintenance examination 03/31/2013  . Urine frequency 12/13/2012  . Non-healing skin lesion of nose 12/13/2012  . Watery eyes 06/16/2012  . Abdominal pain 03/11/2012  . Obesities, morbid 03/08/2012  . Subclinical hyperthyroidism 12/06/2011  . Weight gain, abnormal 11/11/2011  . 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 Fibromyalgia syndrome 08/05/2011  . Anxiety 07/11/2011  . ABDOMINAL PAIN, EPIGASTRIC 07/17/2010  . Anemia 12/31/2009  . VISUAL ACUITY, DECREASED 12/31/2009  . Other and unspecified hyperlipidemia 11/26/2008  . METABOLIC SYNDROME X 22/48/2500  . NONSPECIFIC ABNORM RESULTS THYROID FUNCT STUDY 11/26/2008  . ALLERGIC RHINITIS 08/29/2008  . ELEVATED DIAPHRAM 08/29/2008  . HYPERSOMNIA, ASSOCIATED WITH SLEEP APNEA 08/29/2008  . Other dyspnea and respiratory abnormality 08/14/2008  . HYPERTENSION, ESSENTIAL NOS 09/23/2007  . GERD 09/23/2007   Reginal Lutes, SPT 11/13/2014 2:24 PM  During this treatment session, the therapist was present, participating in, and directing the treatment. TAKACS,KELLY 11/13/2014, 2:24 PM  [facility] [address], [place], [place], [address] Phone: [phone]   Fax:  [phone]

## 2014-11-15 ENCOUNTER — Ambulatory Visit: Payer: BC Managed Care – PPO

## 2014-11-15 DIAGNOSIS — M25652 Stiffness of left hip, not elsewhere classified: Secondary | ICD-10-CM

## 2014-11-15 DIAGNOSIS — M5442 Lumbago with sciatica, left side: Secondary | ICD-10-CM | POA: Diagnosis not present

## 2014-11-15 NOTE — Therapy (Signed)
[facility] [address], [place], [place], [address] Phone: [phone]   Fax:  [phone]  Physical Therapy Treatment  Patient Details  Name: Sabrina [name] MRN: [id number] Date of Birth: [birth year] Referring Provider:  Tammi Sou, MD  Encounter Date: 11/15/2014      PT End of Session - 11/15/14 0944    Visit Number 8   Date for PT Re-Evaluation 12/12/14   PT Start Time 0942   PT Stop Time 1031   PT Time Calculation (min) 49 min   Activity Tolerance Patient tolerated treatment well   Behavior During Therapy [facility] for tasks assessed/performed      Past Medical History  Diagnosis Date  . GERD (gastroesophageal reflux disease)   . Hypertension   . Allergy     rhinitis  . History of cervical cancer     Dr. Irven Baltimore (carcinoma in situ): Laser and cone bx 1993, margins neg, paps wnl since.  . Hyperlipidemia     NMR 03/2009.Marland KitchenLDL 161(2735/1887)HDL 37,TG 271  . Subclinical hyperthyroidism 06/19/11    TSH .29 at GYN (T4 and T3 wnl).  Repeat 07/21/11 wnl  . Sleep apnea     Dr. Elsworth Soho: CPAP 10 cm H2O with full facemask as per titration study 03/21/12  . Chronic fatigue     +excessive daytime somnolence  . Fibromyalgia syndrome   . Dysfunctional uterine bleeding     Metromenorrhagia+dysmenorrhea  . Obesity   . History of wrist fracture     left  . Disturbance of smell and taste 2013    ENT eval (Dr. Bosie Clos) 10/2011 --recommended flonase, afrin, mucinex, saline nasal rinse and then do intracranial imaging if none of that helped in 2 mo.  [name] METABOLIC SYNDROME X 7/61/9509    Qualifier: Diagnosis of  By: Linna Darner MD, Gwyndolyn Saxon   Elevated trigs, HTN, elevated waist circumference.   . Migraine syndrome     topiramate has helped immensely  . Abdominal pain 03/11/2012  . Peripheral neuropathy     Burning/numbness bottoms of feet-saw neurologist, Dr. Delice Lesch, 09/2013.  [name] Chronic low back pain     08/2013 L  spine MRI showed L2-3 DDD and L5-S1 DDD w/out foraminal/nerve encroachment--Dr. Ellene Route did ESI and pt states this was not helpful and also she says they caused her to gain wt.  . Cancer     Past Surgical History  Procedure Laterality Date  . Sinus surgery for septal deviation  1999    & polyps  . Wrist surgery      left  . Knee arthroscopy  04/1988    x2,open procedure for hamstring tendon injury)  . Microdiscectomy lumbar  09/2003    L5/S1 left microdiscectomy (Dr. Patrice Paradise)  . Plantar fascia release  02/2009    Bilateral (Dr. Shellia Carwin)  . Combined hysteroscopy diagnostic / d&c  03/2008    Done for thickened posterior endometrium found on w/u for menorrhagia.  Pathology-benign.  . Anal sphincterotomy  2007    for deep anal fissure (Dr. Brantley Stage)  . Hemorrhoid surgery  2006    Prolapsed internal hemorrhoids (Dr. Brantley Stage)  . 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 Cardiovascular stress test  03/08/14    Normal  . Transthoracic echocardiogram  03/08/14    Normal    There were no vitals filed for this visit.  Visit Diagnosis:  Bilateral low back pain with left-sided sciatica  Hip stiffness, left  Subjective Assessment - 11/15/14 0943    Subjective Still feeling some pain but not doing as bad.    How long can you walk comfortably? 3 minutes     Patient Stated Goals Walking for 30 minutes for exercise, reduce pain, to be able to do household activities without pain    Currently in Pain? Yes   Pain Score 4    Pain Location Back   Pain Orientation Left;Right   Pain Descriptors / Indicators Tightness;Aching   Pain Type Chronic pain   Pain Onset More than a month ago   Pain Frequency Intermittent   Aggravating Factors  sitting, standing    Pain Relieving Factors medication, ice pack                          [facility] Adult PT Treatment/Exercise - 11/15/14 0001    Lumbar Exercises: Stretches   Active Hamstring Stretch 3 reps;20 seconds    Prone Mid Back Stretch 1 rep;20 seconds  Standing in doorway; 20 seconds    Piriformis Stretch 3 reps;20 seconds  seated   Lumbar Exercises: Aerobic   Stationary Bike L2 for 6 minjutes    Lumbar Exercises: Machines for Strengthening   Other Lumbar Machine Exercise Standing multifidi stabilzation; 30#   while walking backward   Lumbar Exercises: Standing   Functional Squats 10 reps  3 setsx10; rest break between sets    Knee/Hip Exercises: Standing   Functional Squat 20 reps  VC for appropriate form; table 23 in from floor    Modalities   Modalities --  Guided medication used while on e-stim and heat   Moist Heat Therapy   Number Minutes Moist Heat 15 Minutes   Moist Heat Location Lumbar Spine   Electrical Stimulation   Electrical Stimulation Location lumbar   Electrical Stimulation Action IFC   Electrical Stimulation Parameters 15    Electrical Stimulation Goals Pain                  PT Short Term Goals - 11/15/14 1023    PT SHORT TERM GOAL #1   Title Independent with HEP   Reports doing core stabilization exericses at home when LBP  is irritating her    Time 4   Period Weeks   Status On-going   PT SHORT TERM GOAL #3   Title Improve walking time to 15 minutes without increase in LBP in order to perform errands   Reports only being able to walk for about 3 minutes before feeling pain    Time 4   Period Weeks   Status On-going           PT Long Term Goals - 11/07/14 1048    PT LONG TERM GOAL #1   Title Independent with advanced HEP    Time 8   Period Weeks   Status On-going   PT LONG TERM GOAL #2   Title Reduce FOTO limitation to < or = 48%    Time 8   Period Weeks   Status On-going   PT LONG TERM GOAL #3   Title Increasing walking ability to 30 minutes to participate in exercise program    Time 8   Period Weeks   Status On-going   PT LONG TERM GOAL #4   Title Improve SLS to 30 seconds bilaterally to decrease risks for falling when performing  ADLs    Time 8   Period Weeks   Status On-going  PT LONG TERM GOAL #5   Title Verbalize knowledge of doing personal exericse progam for weight loss    Time 8   Period Weeks   Status On-going               Plan - 11/15/14 1019    Clinical Impression Statement Able to tolerate more standing exercises today with stretch break in between each set. Pt able to self - correct exercises when performing them. Need to continue to practice proper body mechanics with lifting items off the floor safely and with appropriate balance. Will benefit from skilled PT for continued postural training, LE/core stabilization and body mechanics.    Pt will benefit from skilled therapeutic intervention in order to improve on the following deficits Decreased endurance;Decreased activity tolerance;Pain;Improper body mechanics;Decreased mobility;Decreased strength;Impaired sensation   Rehab Potential Good   PT Frequency 2x / week   PT Duration 8 weeks   PT Treatment/Interventions Moist Heat;Traction;Therapeutic activities;Therapeutic exercise;Energy conservation;Balance training;Neuromuscular re-education;Stair training;Functional mobility training;Patient/family education;Passive range of motion;Cryotherapy;Electrical Stimulation;Manual techniques;ADLs/Self Care Home Management   PT Next Visit Plan Continue to practice squatting at lower level at the table, practice golfer's pose for picking up objects, continue with guided meditation, practice SLS and check goals again.    Consulted and Agree with Plan of Care Patient        Problem List Patient Active Problem List   Diagnosis Date Noted  . Lumbosacral radiculopathy 10/09/2014  . Acute sinus infection 09/29/2014  . SOB (shortness of breath) on exertion 03/03/2014  . Rapid weight gain 03/03/2014  . ECG abnormality 03/03/2014  . Lumbago 02/20/2014  . SOB (shortness of breath) 02/20/2014  . Myalgia and myositis 02/20/2014  . Peripheral edema  02/20/2014  . Subclinical hypothyroidism 02/20/2014  . 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 Neuropathic pain 09/27/2013  . Sinusitis, acute 09/11/2013  . Metatarsalgia 03/31/2013  . Health maintenance examination 03/31/2013  . Urine frequency 12/13/2012  . Non-healing skin lesion of nose 12/13/2012  . Watery eyes 06/16/2012  . Abdominal pain 03/11/2012  . Obesities, morbid 03/08/2012  . Subclinical hyperthyroidism 12/06/2011  . Weight gain, abnormal 11/11/2011  . 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 Fibromyalgia syndrome 08/05/2011  . Anxiety 07/11/2011  . ABDOMINAL PAIN, EPIGASTRIC 07/17/2010  . Anemia 12/31/2009  . VISUAL ACUITY, DECREASED 12/31/2009  . Other and unspecified hyperlipidemia 11/26/2008  . METABOLIC SYNDROME X 57/32/2025  . NONSPECIFIC ABNORM RESULTS THYROID FUNCT STUDY 11/26/2008  . ALLERGIC RHINITIS 08/29/2008  . ELEVATED DIAPHRAM 08/29/2008  . HYPERSOMNIA, ASSOCIATED WITH SLEEP APNEA 08/29/2008  . Other dyspnea and respiratory abnormality 08/14/2008  . HYPERTENSION, ESSENTIAL NOS 09/23/2007  . GERD 09/23/2007   Reginal Lutes, SPT 11/15/2014 10:53 AM   During this treatment session, the therapist was present, participating in, and directing the treatment. TAKACS,KELLY, PT 11/15/2014, 10:53 AM  [facility] [address] [place], [place], [address] Phone: [phone]   Fax:  [phone]

## 2014-11-20 ENCOUNTER — Ambulatory Visit: Payer: BC Managed Care – PPO

## 2014-11-20 DIAGNOSIS — M5442 Lumbago with sciatica, left side: Secondary | ICD-10-CM | POA: Diagnosis not present

## 2014-11-20 DIAGNOSIS — R269 Unspecified abnormalities of gait and mobility: Secondary | ICD-10-CM

## 2014-11-20 DIAGNOSIS — M25652 Stiffness of left hip, not elsewhere classified: Secondary | ICD-10-CM

## 2014-11-20 NOTE — Therapy (Signed)
[facility] [address] [place], [place], [address] Phone: [phone]   Fax:  [phone]  Physical Therapy Treatment  Patient Details  Name: Sabrina [name] MRN: [id number] Date of Birth: [birth year] Referring Provider:  Cameron Sprang, MD  Encounter Date: 11/20/2014      PT End of Session - 11/20/14 0925    Visit Number 9   Date for PT Re-Evaluation 12/12/14   PT Start Time 4481   PT Stop Time 0944   PT Time Calculation (min) 47 min   Activity Tolerance Patient tolerated treatment well   Behavior During Therapy [facility] for tasks assessed/performed      Past Medical History  Diagnosis Date  . GERD (gastroesophageal reflux disease)   . Hypertension   . Allergy     rhinitis  . History of cervical cancer     Dr. Irven Baltimore (carcinoma in situ): Laser and cone bx 1993, margins neg, paps wnl since.  . Hyperlipidemia     NMR 03/2009.Marland KitchenLDL 161(2735/1887)HDL 37,TG 271  . Subclinical hyperthyroidism 06/19/11    TSH .29 at GYN (T4 and T3 wnl).  Repeat 07/21/11 wnl  . Sleep apnea     Dr. Elsworth Soho: CPAP 10 cm H2O with full facemask as per titration study 03/21/12  . Chronic fatigue     +excessive daytime somnolence  . Fibromyalgia syndrome   . Dysfunctional uterine bleeding     Metromenorrhagia+dysmenorrhea  . Obesity   . History of wrist fracture     left  . Disturbance of smell and taste 2013    ENT eval (Dr. Bosie Clos) 10/2011 --recommended flonase, afrin, mucinex, saline nasal rinse and then do intracranial imaging if none of that helped in 2 mo.  [name] METABOLIC SYNDROME X 8/56/3149    Qualifier: Diagnosis of  By: Linna Darner MD, Gwyndolyn Saxon   Elevated trigs, HTN, elevated waist circumference.   . Migraine syndrome     topiramate has helped immensely  . Abdominal pain 03/11/2012  . Peripheral neuropathy     Burning/numbness bottoms of feet-saw neurologist, Dr. Delice Lesch, 09/2013.  [name] Chronic low back pain     08/2013 L  spine MRI showed L2-3 DDD and L5-S1 DDD w/out foraminal/nerve encroachment--Dr. Ellene Route did ESI and pt states this was not helpful and also she says they caused her to gain wt.  . Cancer     Past Surgical History  Procedure Laterality Date  . Sinus surgery for septal deviation  1999    & polyps  . Wrist surgery      left  . Knee arthroscopy  04/1988    x2,open procedure for hamstring tendon injury)  . Microdiscectomy lumbar  09/2003    L5/S1 left microdiscectomy (Dr. Patrice Paradise)  . Plantar fascia release  02/2009    Bilateral (Dr. Shellia Carwin)  . Combined hysteroscopy diagnostic / d&c  03/2008    Done for thickened posterior endometrium found on w/u for menorrhagia.  Pathology-benign.  . Anal sphincterotomy  2007    for deep anal fissure (Dr. Brantley Stage)  . Hemorrhoid surgery  2006    Prolapsed internal hemorrhoids (Dr. Brantley Stage)  . 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 Cardiovascular stress test  03/08/14    Normal  . Transthoracic echocardiogram  03/08/14    Normal    There were no vitals filed for this visit.  Visit Diagnosis:  Bilateral low back pain with left-sided sciatica  Hip stiffness, left  Abnormality of gait  Subjective Assessment - 11/20/14 0859    Subjective I have had a lot of things going on lately.  A lot of MD appoinements.     Pertinent History Hx of back surgery (12 years ago), fibromyalgia, hx of CA ,Left hamstring rupture with sx reattachment   Currently in Pain? Yes   Pain Score 5    Pain Location Back   Pain Orientation Right;Left   Pain Descriptors / Indicators Tightness;Aching   Pain Type Chronic pain   Pain Onset More than a month ago   Pain Frequency Intermittent   Aggravating Factors  sitting, standing   Pain Relieving Factors meds, ice pack                         [facility] Adult PT Treatment/Exercise - 11/20/14 0001    Exercises   Exercises Lumbar   Lumbar Exercises: Stretches   Active Hamstring Stretch  3 reps;20 seconds  with strap   Single Knee to Chest Stretch 3 reps;20 seconds   Lower Trunk Rotation 3 reps;20 seconds  cueing to keep back flat on the mat    Active Hamstring Stretch 3 reps;20 seconds   Lumbar Exercises: Aerobic   Stationary Bike L2 for 6 minjutes    Lumbar Exercises: Machines for Strengthening   Other Lumbar Machine Exercise Standing multifidi stabilzation; 30#   while walking backward   Lumbar Exercises: Supine   Bridge 10 reps;5 seconds   Modalities   Modalities Electrical Stimulation;Moist Heat   Moist Heat Therapy   Number Minutes Moist Heat 15 Minutes   Moist Heat Location Lumbar Spine   Electrical Stimulation   Electrical Stimulation Location lumbar   Electrical Stimulation Action IFC   Electrical Stimulation Parameters 15   Electrical Stimulation Goals Pain                  PT Short Term Goals - 11/20/14 0901    PT SHORT TERM GOAL #1   Title Independent with HEP    Status Achieved   PT SHORT TERM GOAL #3   Title Improve walking time to 15 minutes without increase in LBP in order to perform errands    Time 4   Period Weeks   Status On-going  5 minute max   PT SHORT TERM GOAL #4   Title Report 20% decrease pain in LBP when doing household activities 4   Time 4   Period Weeks   Status On-going  Pt reports a lot of stress and this is contributing           PT Long Term Goals - 11/07/14 1048    PT LONG TERM GOAL #1   Title Independent with advanced HEP    Time 8   Period Weeks   Status On-going   PT LONG TERM GOAL #2   Title Reduce FOTO limitation to < or = 48%    Time 8   Period Weeks   Status On-going   PT LONG TERM GOAL #3   Title Increasing walking ability to 30 minutes to participate in exercise program    Time 8   Period Weeks   Status On-going   PT LONG TERM GOAL #4   Title Improve SLS to 30 seconds bilaterally to decrease risks for falling when performing ADLs    Time 8   Period Weeks   Status On-going   PT  LONG TERM GOAL #5   Title Verbalize knowledge of doing personal  exericse progam for weight loss    Time 8   Period Weeks   Status On-going               Plan - 11/20/14 0902    Clinical Impression Statement Pt with continued constant LBP.  Pt with a lot of stress reported over the past few weeks and reports that this is contributing to her pain.  Pt is independent in HEP and able to tolerate all gym exercises without limitation today.  Pt will benefit from skilled PT for posture, core strenth and body mechanics.   Pt will benefit from skilled therapeutic intervention in order to improve on the following deficits Decreased endurance;Decreased activity tolerance;Pain;Improper body mechanics;Decreased mobility;Decreased strength;Impaired sensation   Rehab Potential Good   PT Frequency 2x / week   PT Duration 8 weeks   PT Treatment/Interventions Moist Heat;Traction;Therapeutic activities;Therapeutic exercise;Energy conservation;Balance training;Neuromuscular re-education;Stair training;Functional mobility training;Patient/family education;Passive range of motion;Cryotherapy;Electrical Stimulation;Manual techniques;ADLs/Self Care Home Management   PT Next Visit Plan Core strength, mobility, flexibilty, modalities for pain   Consulted and Agree with Plan of Care Patient        Problem List Patient Active Problem List   Diagnosis Date Noted  . Lumbosacral radiculopathy 10/09/2014  . Acute sinus infection 09/29/2014  . SOB (shortness of breath) on exertion 03/03/2014  . Rapid weight gain 03/03/2014  . ECG abnormality 03/03/2014  . Lumbago 02/20/2014  . SOB (shortness of breath) 02/20/2014  . Myalgia and myositis 02/20/2014  . Peripheral edema 02/20/2014  . Subclinical hypothyroidism 02/20/2014  . 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 Neuropathic pain 09/27/2013  . Sinusitis, acute 09/11/2013  . Metatarsalgia 03/31/2013  . Health maintenance examination 03/31/2013  . Urine frequency  12/13/2012  . Non-healing skin lesion of nose 12/13/2012  . Watery eyes 06/16/2012  . Abdominal pain 03/11/2012  . Obesities, morbid 03/08/2012  . Subclinical hyperthyroidism 12/06/2011  . Weight gain, abnormal 11/11/2011  . 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 Fibromyalgia syndrome 08/05/2011  . Anxiety 07/11/2011  . ABDOMINAL PAIN, EPIGASTRIC 07/17/2010  . Anemia 12/31/2009  . VISUAL ACUITY, DECREASED 12/31/2009  . Other and unspecified hyperlipidemia 11/26/2008  . METABOLIC SYNDROME X 99/24/2683  . NONSPECIFIC ABNORM RESULTS THYROID FUNCT STUDY 11/26/2008  . ALLERGIC RHINITIS 08/29/2008  . ELEVATED DIAPHRAM 08/29/2008  . HYPERSOMNIA, ASSOCIATED WITH SLEEP APNEA 08/29/2008  . Other dyspnea and respiratory abnormality 08/14/2008  . HYPERTENSION, ESSENTIAL NOS 09/23/2007  . GERD 09/23/2007    Nguyen Butler, PT 11/20/2014, 9:26 AM  [facility] [address] [place], [place], [address] Phone: [phone]   Fax:  [phone]

## 2014-11-21 ENCOUNTER — Ambulatory Visit: Payer: BC Managed Care – PPO | Admitting: Physical Therapy

## 2014-11-23 ENCOUNTER — Ambulatory Visit: Payer: BC Managed Care – PPO | Admitting: Physical Therapy

## 2014-11-28 ENCOUNTER — Ambulatory Visit: Payer: BC Managed Care – PPO | Admitting: Physical Therapy

## 2014-11-28 ENCOUNTER — Other Ambulatory Visit: Payer: Self-pay | Admitting: *Deleted

## 2014-11-28 MED ORDER — LOSARTAN POTASSIUM-HCTZ 100-25 MG PO TABS: ORAL_TABLET | ORAL | Status: DC

## 2014-11-28 NOTE — Telephone Encounter (Signed)
RF request for Losartan/ hctz LOV: 05/30/14 Next ov: 12/04/14 apt made today (11/28/14) Last written: 07/09/14 #30 w/ 3RF Pt aware only 30 day supply has been sent in and she will need to keep ov on 12/04/14.

## 2014-11-29 ENCOUNTER — Ambulatory Visit: Payer: BC Managed Care – PPO | Admitting: Physical Therapy

## 2014-11-29 DIAGNOSIS — M25652 Stiffness of left hip, not elsewhere classified: Secondary | ICD-10-CM

## 2014-11-29 DIAGNOSIS — M5442 Lumbago with sciatica, left side: Secondary | ICD-10-CM | POA: Diagnosis not present

## 2014-11-29 NOTE — Therapy (Addendum)
[facility] [address], [place], [place], [address] Phone: [phone]   Fax:  [phone]  Physical Therapy Treatment  Patient Details  Name: Sabrina [name] MRN: [id number] Date of Birth: Dec 30, 1960 Referring Provider:  Cameron Sprang, MD  Encounter Date: 11/29/2014      PT End of Session - 11/29/14 1625    Visit Number 10   Date for PT Re-Evaluation 12/12/14   PT Start Time 1623   PT Stop Time 1712   PT Time Calculation (min) 49 min   Activity Tolerance Patient tolerated treatment well      Past Medical History  Diagnosis Date  . GERD (gastroesophageal reflux disease)   . Hypertension   . Allergy     rhinitis  . History of cervical cancer     Dr. Irven Baltimore (carcinoma in situ): Laser and cone bx 1993, margins neg, paps wnl since.  . Hyperlipidemia     NMR 03/2009.Marland KitchenLDL 161(2735/1887)HDL 37,TG 271  . Subclinical hyperthyroidism 06/19/11    TSH .29 at GYN (T4 and T3 wnl).  Repeat 07/21/11 wnl  . Sleep apnea     Dr. Elsworth Soho: CPAP 10 cm H2O with full facemask as per titration study 03/21/12  . Chronic fatigue     +excessive daytime somnolence  . Fibromyalgia syndrome   . Dysfunctional uterine bleeding     Metromenorrhagia+dysmenorrhea  . Obesity   . History of wrist fracture     left  . Disturbance of smell and taste 2013    ENT eval (Dr. Bosie Clos) 10/2011 --recommended flonase, afrin, mucinex, saline nasal rinse and then do intracranial imaging if none of that helped in 2 mo.  [name] METABOLIC SYNDROME X 7/90/3833    Qualifier: Diagnosis of  By: Linna Darner MD, Gwyndolyn Saxon   Elevated trigs, HTN, elevated waist circumference.   . Migraine syndrome     topiramate has helped immensely  . Abdominal pain 03/11/2012  . Peripheral neuropathy     Burning/numbness bottoms of feet-saw neurologist, Dr. Delice Lesch, 09/2013.  [name] Chronic low back pain     08/2013 L spine MRI showed L2-3 DDD and L5-S1 DDD w/out foraminal/nerve  encroachment--Dr. Ellene Route did ESI and pt states this was not helpful and also she says they caused her to gain wt.  . Cancer     Past Surgical History  Procedure Laterality Date  . Sinus surgery for septal deviation  1999    & polyps  . Wrist surgery      left  . Knee arthroscopy  04/1988    x2,open procedure for hamstring tendon injury)  . Microdiscectomy lumbar  09/2003    L5/S1 left microdiscectomy (Dr. Patrice Paradise)  . Plantar fascia release  02/2009    Bilateral (Dr. Shellia Carwin)  . Combined hysteroscopy diagnostic / d&c  03/2008    Done for thickened posterior endometrium found on w/u for menorrhagia.  Pathology-benign.  . Anal sphincterotomy  2007    for deep anal fissure (Dr. Brantley Stage)  . Hemorrhoid surgery  2006    Prolapsed internal hemorrhoids (Dr. Brantley Stage)  . 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 Cardiovascular stress test  03/08/14    Normal  . Transthoracic echocardiogram  03/08/14    Normal    There were no vitals filed for this visit.  Visit Diagnosis:  Bilateral low back pain with left-sided sciatica  Hip stiffness, left      Subjective Assessment - 11/29/14 1625  Subjective Last night was bad when trying to clean up from dinner. Loading dishwasher and hand wash a few things. Today pretty good because haven't done anything. I don't think the sciatica has flared up too bad lately.   How long can you walk comfortably? 5-6 minutes    Currently in Pain? Yes   Pain Score 3    Pain Location Back   Pain Orientation Right;Left   Pain Descriptors / Indicators Tightness;Aching   Pain Type Chronic pain   Pain Frequency Intermittent   Aggravating Factors  standing and walking   Pain Relieving Factors sitting and stretching                         [facility] Adult PT Treatment/Exercise - 11/29/14 0001    Lumbar Exercises: Stretches   Active Hamstring Stretch 3 reps;20 seconds  with strap   Single Knee to Chest Stretch 3 reps;20  seconds   Lumbar Exercises: Aerobic   Stationary Bike Nustep L4 x 8 min (94-96 SPM)   Lumbar Exercises: Machines for Strengthening   Other Lumbar Machine Exercise Standing multifidi stabilzation; 45#   while walking backward x 20   Lumbar Exercises: Standing   Other Standing Lumbar Exercises Multifidus with green band x 10 each way.   Lumbar Exercises: Supine   Glut Set 10 reps   Bridge 10 reps   Lumbar Exercises: Prone   Other Prone Lumbar Exercises pelvic press 10 x 5 sec; then with knee bends x 5 ea; then bil knees; then with hip ext x 5   Knee/Hip Exercises: Stretches   Active Hamstring Stretch --   Moist Heat Therapy   Number Minutes Moist Heat 15 Minutes   Moist Heat Location Lumbar Spine   Electrical Stimulation   Electrical Stimulation Location lumbar   Electrical Stimulation Action IFC    Electrical Stimulation Parameters 15   Electrical Stimulation Goals Pain                  PT Short Term Goals - 11/29/14 1708    PT SHORT TERM GOAL #1   Title Independent with HEP    Time 4   Period Weeks   Status Achieved   PT SHORT TERM GOAL #2   Title Increase SLS time to 20 second bilaterally to improve balance with stair climbing   15 seconds on Rt; 10 sec on left   Time 4   Period Weeks   Status On-going   PT SHORT TERM GOAL #3   Title Improve walking time to 15 minutes without increase in LBP in order to perform errands   5-6 minutes   Time 4   Period Weeks   Status On-going   PT SHORT TERM GOAL #4   Title Report 20% decrease pain in LBP when doing household activities 4   Time 4   Period Weeks   Status On-going           PT Long Term Goals - 11/29/14 1707    PT LONG TERM GOAL #1   Title Independent with advanced HEP    Time 8   Period Weeks   Status On-going               Plan - 11/29/14 1709    Clinical Impression Statement Patient has not met any goals at this time. She continues to have constant pain. She reports feeling better  after therapy, but then the pain returns. PT recommended  water exercises however patient stated the [facility] is too [facility].  She shows minor improvements in walking tolerance. If no goals met in next  week consider discharge.   PT Next Visit Plan Primary PT discuss continuation or dc with patient.   Consulted and Agree with Plan of Care Patient        Problem List Patient Active Problem List   Diagnosis Date Noted  . Lumbosacral radiculopathy 10/09/2014  . Acute sinus infection 09/29/2014  . SOB (shortness of breath) on exertion 03/03/2014  . Rapid weight gain 03/03/2014  . ECG abnormality 03/03/2014  . Lumbago 02/20/2014  . SOB (shortness of breath) 02/20/2014  . Myalgia and myositis 02/20/2014  . Peripheral edema 02/20/2014  . Subclinical hypothyroidism 02/20/2014  . 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 Neuropathic pain 09/27/2013  . Sinusitis, acute 09/11/2013  . Metatarsalgia 03/31/2013  . Health maintenance examination 03/31/2013  . Urine frequency 12/13/2012  . Non-healing skin lesion of nose 12/13/2012  . Watery eyes 06/16/2012  . Abdominal pain 03/11/2012  . Obesities, morbid 03/08/2012  . Subclinical hyperthyroidism 12/06/2011  . Weight gain, abnormal 11/11/2011  . 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 Fibromyalgia syndrome 08/05/2011  . Anxiety 07/11/2011  . ABDOMINAL PAIN, EPIGASTRIC 07/17/2010  . Anemia 12/31/2009  . VISUAL ACUITY, DECREASED 12/31/2009  . Other and unspecified hyperlipidemia 11/26/2008  . METABOLIC SYNDROME X 88/50/2774  . NONSPECIFIC ABNORM RESULTS THYROID FUNCT STUDY 11/26/2008  . ALLERGIC RHINITIS 08/29/2008  . ELEVATED DIAPHRAM 08/29/2008  . HYPERSOMNIA, ASSOCIATED WITH SLEEP APNEA 08/29/2008  . Other dyspnea and respiratory abnormality 08/14/2008  . HYPERTENSION, ESSENTIAL NOS 09/23/2007  . GERD 09/23/2007   Madelyn Flavors PT  11/29/2014, 5:22 PM PHYSICAL THERAPY DISCHARGE SUMMARY  Visits from Start of Care: 10  Current functional level related to  goals / functional outcomes: See above for current status.  Pt has not met goals at this time.     Remaining deficits: Pt with continued chronic pain.  Pt has HEP in place and has had body mechanics education.  Pt will follow-up with MD as needed.     Education / Equipment: HEP, Economist education  Plan: Patient agrees to discharge.  Patient goals were partially met. Patient is being discharged due to not returning since the last visit.  ?????  And limited progress toward goals.   Sigurd Sos, PT 12/13/2014 2:49 PM   [facility] [address], [place], [place], [address] Phone: [phone]   Fax:  [phone]

## 2014-11-30 ENCOUNTER — Encounter: Payer: BC Managed Care – PPO | Admitting: Physical Therapy

## 2014-12-04 ENCOUNTER — Ambulatory Visit (INDEPENDENT_AMBULATORY_CARE_PROVIDER_SITE_OTHER): Payer: BC Managed Care – PPO | Admitting: Family Medicine

## 2014-12-04 ENCOUNTER — Encounter: Payer: Self-pay | Admitting: Family Medicine

## 2014-12-04 VITALS — BP 128/87 | HR 96 | Temp 98.4°F | Resp 16 | Ht 63.5 in | Wt 240.0 lb

## 2014-12-04 DIAGNOSIS — R609 Edema, unspecified: Secondary | ICD-10-CM | POA: Diagnosis not present

## 2014-12-04 DIAGNOSIS — I10 Essential (primary) hypertension: Secondary | ICD-10-CM | POA: Diagnosis not present

## 2014-12-04 DIAGNOSIS — M797 Fibromyalgia: Secondary | ICD-10-CM

## 2014-12-04 DIAGNOSIS — M654 Radial styloid tenosynovitis [de Quervain]: Secondary | ICD-10-CM | POA: Diagnosis not present

## 2014-12-04 DIAGNOSIS — M5417 Radiculopathy, lumbosacral region: Secondary | ICD-10-CM | POA: Diagnosis not present

## 2014-12-04 MED ORDER — CLONAZEPAM 1 MG PO TABS: 1.0000 mg | ORAL_TABLET | Freq: Two times a day (BID) | ORAL | Status: DC

## 2014-12-04 MED ORDER — LOSARTAN POTASSIUM-HCTZ 100-25 MG PO TABS
ORAL_TABLET | ORAL | Status: DC
Start: 2014-12-04 — End: 2015-12-09

## 2014-12-04 MED ORDER — LIDOCAINE HCL 1 % IJ SOLN
1.5000 mL | Freq: Once | INTRAMUSCULAR | Status: AC
  Administered 2014-12-04: 1.5 mL

## 2014-12-04 MED ORDER — OXYCODONE HCL 10 MG PO TABS: ORAL_TABLET | ORAL | Status: DC

## 2014-12-04 MED ORDER — FUROSEMIDE 40 MG PO TABS: ORAL_TABLET | ORAL | Status: DC

## 2014-12-04 MED ORDER — METHYLPREDNISOLONE ACETATE 40 MG/ML IJ SUSP
40.0000 mg | Freq: Once | INTRAMUSCULAR | Status: AC
  Administered 2014-12-04: 40 mg via INTRA_ARTICULAR

## 2014-12-04 MED ORDER — OMEPRAZOLE 20 MG PO CPDR: 20.0000 mg | DELAYED_RELEASE_CAPSULE | Freq: Two times a day (BID) | ORAL | Status: DC

## 2014-12-04 MED ORDER — CELECOXIB 200 MG PO CAPS: ORAL_CAPSULE | ORAL | Status: DC

## 2014-12-04 NOTE — Progress Notes (Signed)
OFFICE VISIT  12/05/2014   CC:  Chief Complaint  Patient presents with  . Follow-up   HPI:    Patient is a 54 y.o. Caucasian female who presents for f/u fibromyalgia, HTN, chronic low back pain/DDD with radiculopathy, and urge incontinence.  Last time I saw her was about 8 mo ago and I started her on ditropan XL and this has helped. She has been going through PT for the last 2-3 mo for LBP with L sided sciatica.  Says it is not helping any.  Injections have not helped in past.   Says "my life is being run by pain, depressin, and obesity".  Home bp: avg 120s over 70s.  She is stressed and in pain today.  Right wrist hurting several years, focal pain in wrist near radial styloid region.  Thumb spica splint does help but when takes splint off she has return of sx's.  Hand feels numb intermittently: all fingers/palm.  Feels weak due to the pain--drops cups, etc.    Past Medical History  Diagnosis Date  . GERD (gastroesophageal reflux disease)   . Hypertension   . Allergy     rhinitis  . History of cervical cancer     Dr. Irven Baltimore (carcinoma in situ): Laser and cone bx 1993, margins neg, paps wnl since.  . Hyperlipidemia     NMR 03/2009.Marland KitchenLDL 161(2735/1887)HDL 37,TG 271  . Subclinical hyperthyroidism 06/19/11    TSH .29 at GYN (T4 and T3 wnl).  Repeat 07/21/11 wnl  . Sleep apnea     Dr. Elsworth Soho: CPAP 10 cm H2O with full facemask as per titration study 03/21/12  . Chronic fatigue     +excessive daytime somnolence  . Fibromyalgia syndrome   . Dysfunctional uterine bleeding     Metromenorrhagia+dysmenorrhea  . Obesity   . History of wrist fracture     left  . Disturbance of smell and taste 2013    ENT eval (Dr. Bosie Clos) 10/2011 --recommended flonase, afrin, mucinex, saline nasal rinse and then do intracranial imaging if none of that helped in 2 mo.  [name] METABOLIC SYNDROME X [id number]/02/9322    Qualifier: Diagnosis of  By: Linna Darner MD, Gwyndolyn Saxon   Elevated trigs, HTN, elevated waist  circumference.   . Migraine syndrome     topiramate has helped immensely  . Abdominal pain 03/11/2012  . Peripheral neuropathy     Burning/numbness bottoms of feet-saw neurologist, Dr. Delice Lesch, 09/2013.  [name] Chronic low back pain     08/2013 L spine MRI showed L2-3 DDD and L5-S1 DDD w/out foraminal/nerve encroachment--Dr. Ellene Route did ESI and pt states this was not helpful and also she says they caused her to gain wt.  . Cancer     Past Surgical History  Procedure Laterality Date  . Sinus surgery for septal deviation  1999    & polyps  . Wrist surgery      left  . Knee arthroscopy  04/1988    x2,open procedure for hamstring tendon injury)  . Microdiscectomy lumbar  09/2003    L5/S1 left microdiscectomy (Dr. Patrice Paradise)  . Plantar fascia release  02/2009    Bilateral (Dr. Shellia Carwin)  . Combined hysteroscopy diagnostic / d&c  03/2008    Done for thickened posterior endometrium found on w/u for menorrhagia.  Pathology-benign.  . Anal sphincterotomy  2007    for deep anal fissure (Dr. Brantley Stage)  . Hemorrhoid surgery  2006    Prolapsed internal hemorrhoids (Dr. Brantley Stage)  . 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 Cardiovascular stress test  03/08/14    Normal  . Transthoracic echocardiogram  03/08/14    Normal    Outpatient Prescriptions Prior to Visit  Medication Sig Dispense Refill  . fluticasone (FLONASE) 50 MCG/ACT nasal spray Place 2 sprays into the nose daily.     [name] gabapentin (NEURONTIN) 300 MG capsule Take 3 capsules three times a day 270 capsule 6  . medroxyPROGESTERone (PROVERA) 5 MG tablet Take 5 mg by mouth daily.    [name] oxybutynin (DITROPAN-XL) 10 MG 24 hr tablet Take 1 tablet (10 mg total) by mouth at bedtime. (Patient taking differently: Take 10 mg by mouth at bedtime as needed. ) 30 tablet 11  . celecoxib (CELEBREX) 200 MG capsule TAKE 1 CAPSULE (200 MG TOTAL) BY MOUTH DAILY. 30 capsule 3  . clonazePAM (KLONOPIN) 1 MG tablet Take 1 tablet (1 mg total) by mouth  2 (two) times daily. 60 tablet 5  . furosemide (LASIX) 40 MG tablet 1 tab po qd prn swelling of extremities (take this no more than 2 days per week) 30 tablet 1  . losartan-hydrochlorothiazide (HYZAAR) 100-25 MG per tablet TAKE 1 TABLET BY MOUTH DAILY. 30 tablet 0  . omeprazole (PRILOSEC) 20 MG capsule Take 1 capsule (20 mg total) by mouth daily. (Patient taking differently: Take 20 mg by mouth 2 (two) times daily. ) 90 capsule 1  . Oxycodone HCl 10 MG TABS 1-2 tabs po q6h prn pain    . amoxicillin-clavulanate (AUGMENTIN) 875-125 MG per tablet Take 1 tablet by mouth 2 (two) times daily. (Patient not taking: Reported on 10/17/2014) 20 tablet 0  . desvenlafaxine (PRISTIQ) 50 MG 24 hr tablet Take 50 mg by mouth daily.    . methocarbamol (ROBAXIN) 500 MG tablet TAKE 1/2 TABLET DURING THE DAY AND 1 TABLET AT NIGHT. (Patient not taking: Reported on 12/04/2014) 45 tablet 5   No facility-administered medications prior to visit.    Allergies  Allergen Reactions  . Hydrocodone-Acetaminophen     Pt states she has itching after taking large quantities.  OK prn.    ROS As per HPI  PE: Blood pressure 128/87, pulse 96, temperature 98.4 F (36.9 C), temperature source Oral, resp. rate 16, height 5' 3.5" (1.613 m), weight 240 lb (108.863 kg), SpO2 95 %. Gen: Alert, well appearing.  Patient is oriented to person, place, time, and situation. Phalen's: neg Tinel's neg. Mild TTP around area of radial styloid on R wrist, with minimal TTP along aBductor and extensor tendons of thumb.  No joint tenderness in fingers/hand.  No muscle atrophy of hand.     LABS:  Lab Results  Component Value Date   TSH 0.537 02/16/2014   Lab Results  Component Value Date   WBC 6.7 02/16/2014   HGB 11.2* 02/16/2014   HCT 35.4* 02/16/2014   MCV 76.5* 02/16/2014   PLT 343 02/16/2014   Lab Results  Component Value Date   CREATININE 1.1 02/27/2014   BUN 17 02/27/2014   NA 136 02/27/2014   K 3.8 02/27/2014   CL 105  02/27/2014   CO2 25 02/27/2014   Lab Results  Component Value Date   ALT 30 02/16/2014   AST 33 02/16/2014   ALKPHOS 73 02/16/2014   BILITOT 0.3 02/16/2014   Lab Results  Component Value Date   CHOL 296* 03/29/2013   Lab Results  Component Value Date   HDL 41 03/29/2013   Lab Results  Component Value Date  [place] 202* 03/29/2013   Lab Results  Component Value Date   TRIG 263* 03/29/2013   Lab Results  Component Value Date   CHOLHDL 7.2 03/29/2013   IMPRESSION AND PLAN:  1) Fibromyalgia: stable--celebrex and occ oxycodone.  2) L spine DDD/HNP with radiculopathy: not improving with conservative/first line therapies, and L spine injections have failed in the past as well.  I recommended she call her neurosurgeon to discuss the option of surgery (Dr. Ellene Route). I did RF hx oxycodone 5mg  today, #60.  3) HTN: The current medical regimen is effective;  continue present plan and medications.  4) Urge incontinence: improved/responds to ditropan XL hs prn  5) De Quervain's tenosynovitis R wrist/thumb: pt desired steroid injection today.  Procedure: therapeutic tendon sheath injection, R thumb extensor/abductor:[name]  The patient's clinical condition is marked by substantial pain and/or significant functional disability.  Other conservative therapy has not provided relief, is contraindicated, or not appropriate.  There is a reasonable likelihood that injection will significantly improve the patient's pain and/or functional disability. Cleaned skin with alcohol swab, landmarks identified and pt's hand positioned, Injected 1  ml of 40mg /ml depo medrol + 1.5 ml of 1% lidocaine w/out epi into tendon sheath just distal to R radial styloid.  No immediate complications.  Patient tolerated procedure well.  Post-injection care discussed, including 20 min of icing 1-2 times in the next 4-8 hours, frequent non weight-bearing ROM exercises over the next few days, and general pain medication  management.  An After Visit Summary was printed and given to the patient.  FOLLOW UP: Return in about 6 months (around 06/06/2015) for routine chronic illness f/u.

## 2014-12-04 NOTE — Progress Notes (Signed)
Pre visit review using our clinic review tool, if applicable. No additional management support is needed unless otherwise documented below in the visit note. 

## 2014-12-05 ENCOUNTER — Telehealth: Payer: Self-pay | Admitting: Family Medicine

## 2014-12-05 MED ORDER — OMEPRAZOLE 20 MG PO CPDR: 20.0000 mg | DELAYED_RELEASE_CAPSULE | Freq: Two times a day (BID) | ORAL | Status: DC

## 2014-12-05 MED ORDER — CELECOXIB 200 MG PO CAPS: ORAL_CAPSULE | ORAL | Status: DC

## 2014-12-05 NOTE — Telephone Encounter (Signed)
Spoke to pt and she states that when she saw her ENT he upped her omeprazole because he could see that she was still having trouble with reflux. Is it okay to change Rx to omeprazole 40mg  BID? Also pt is requesting a new Rx be sent to [facility] for brand Celebrex, she stated that she has a discount card for this medication and it will only cost her $4 compared to the $12 she would have to pay for the generic. Please advise Thanks.

## 2014-12-05 NOTE — Telephone Encounter (Signed)
Patient does not want generic version of Celebrex [facility]

## 2014-12-05 NOTE — Telephone Encounter (Signed)
Patient went to pick up Omeprazole Rx. She has been taking 40 MG twice a day. Please contact her.

## 2014-12-05 NOTE — Telephone Encounter (Signed)
Both rx's authorized/filled.

## 2014-12-05 NOTE — Telephone Encounter (Signed)
Left message for pt to call back  °

## 2014-12-06 MED ORDER — OMEPRAZOLE 40 MG PO CPDR: 40.0000 mg | DELAYED_RELEASE_CAPSULE | Freq: Two times a day (BID) | ORAL | Status: DC

## 2014-12-06 NOTE — Telephone Encounter (Signed)
Left message advising pt that Rx have been sent.

## 2014-12-06 NOTE — Telephone Encounter (Signed)
Rx for 20mg  sent in Error, per Dr. Anitra Lauth okay to resend for 40mg . Rx resent.

## 2014-12-27 ENCOUNTER — Other Ambulatory Visit: Payer: Self-pay | Admitting: Neurological Surgery

## 2015-01-09 ENCOUNTER — Ambulatory Visit: Payer: BC Managed Care – PPO | Admitting: Neurology

## 2015-01-10 ENCOUNTER — Encounter (HOSPITAL_COMMUNITY): Payer: Self-pay

## 2015-01-10 ENCOUNTER — Encounter (HOSPITAL_COMMUNITY)
Admission: RE | Admit: 2015-01-10 | Discharge: 2015-01-10 | Disposition: A | Discharge status: Home / Self Care | Payer: BC Managed Care – PPO | Source: Ambulatory Visit | Attending: Neurological Surgery | Admitting: Neurological Surgery

## 2015-01-10 DIAGNOSIS — M5416 Radiculopathy, lumbar region: Secondary | ICD-10-CM | POA: Insufficient documentation

## 2015-01-10 DIAGNOSIS — E785 Hyperlipidemia, unspecified: Secondary | ICD-10-CM | POA: Insufficient documentation

## 2015-01-10 DIAGNOSIS — E8881 Metabolic syndrome: Secondary | ICD-10-CM | POA: Insufficient documentation

## 2015-01-10 DIAGNOSIS — Z79899 Other long term (current) drug therapy: Secondary | ICD-10-CM | POA: Diagnosis not present

## 2015-01-10 DIAGNOSIS — Z0183 Encounter for blood typing: Secondary | ICD-10-CM | POA: Insufficient documentation

## 2015-01-10 DIAGNOSIS — K219 Gastro-esophageal reflux disease without esophagitis: Secondary | ICD-10-CM | POA: Diagnosis not present

## 2015-01-10 DIAGNOSIS — Z8541 Personal history of malignant neoplasm of cervix uteri: Secondary | ICD-10-CM | POA: Diagnosis not present

## 2015-01-10 DIAGNOSIS — G4733 Obstructive sleep apnea (adult) (pediatric): Secondary | ICD-10-CM | POA: Insufficient documentation

## 2015-01-10 DIAGNOSIS — Z01818 Encounter for other preprocedural examination: Secondary | ICD-10-CM | POA: Insufficient documentation

## 2015-01-10 DIAGNOSIS — Z01812 Encounter for preprocedural laboratory examination: Secondary | ICD-10-CM | POA: Insufficient documentation

## 2015-01-10 DIAGNOSIS — I10 Essential (primary) hypertension: Secondary | ICD-10-CM | POA: Insufficient documentation

## 2015-01-10 HISTORY — DX: Major depressive disorder, single episode, unspecified: F32.9

## 2015-01-10 HISTORY — DX: Anemia, unspecified: D64.9

## 2015-01-10 HISTORY — DX: Other specified postprocedural states: Z98.890

## 2015-01-10 HISTORY — DX: Nausea with vomiting, unspecified: R11.2

## 2015-01-10 HISTORY — DX: Anxiety disorder, unspecified: F41.9

## 2015-01-10 LAB — BASIC METABOLIC PANEL WITH GFR
Anion gap: 11 (ref 5–15)
BUN: 15 mg/dL (ref 6–20)
CO2: 23 mmol/L (ref 22–32)
Calcium: 9.4 mg/dL (ref 8.9–10.3)
Chloride: 104 mmol/L (ref 101–111)
Creatinine, Ser: 0.82 mg/dL (ref 0.44–1.00)
GFR calc Af Amer: >60 mL/min
GFR calc non Af Amer: >60 mL/min
Glucose, Bld: 89 mg/dL (ref 65–99)
Potassium: 4 mmol/L (ref 3.5–5.1)
Sodium: 138 mmol/L (ref 135–145)

## 2015-01-10 LAB — TYPE AND SCREEN
ABO/RH(D): O POS
ANTIBODY SCREEN: NEGATIVE

## 2015-01-10 LAB — CBC
HCT: 39.2 % (ref 36.0–46.0)
HEMOGLOBIN: 12.4 g/dL (ref 12.0–15.0)
MCH: 24.5 pg — ABNORMAL LOW (ref 26.0–34.0)
MCHC: 31.6 g/dL (ref 30.0–36.0)
MCV: 77.5 fL — ABNORMAL LOW (ref 78.0–100.0)
PLATELETS: 328 10*3/uL (ref 150–400)
RBC: 5.06 MIL/uL (ref 3.87–5.11)
RDW: 16 % — AB (ref 11.5–15.5)
WBC: 8.1 10*3/uL (ref 4.0–10.5)

## 2015-01-10 LAB — ABO/RH: ABO/RH(D): O POS

## 2015-01-10 LAB — SURGICAL PCR SCREEN
MRSA, PCR: NEGATIVE
Staphylococcus aureus: POSITIVE — AB

## 2015-01-10 NOTE — Pre-Procedure Instructions (Signed)
Sabrina [name]  01/10/2015      [id number] [place], [place] - [address] [place] [address] Phone: [phone] Fax: [phone]  [id number] - [place], [place] - [address] [place] [address] Phone: [phone] Fax: [phone]    Your procedure is scheduled on Tuesday January 15 2015 at 1100am.  Report to [facility] at 800 A.M.  Call this number if you have problems the morning of surgery:  [phone]   Call this number if you have problems in the days leading up to your surgery that the doctor's office  cannot answer:  [phone]    Remember:  Do not eat food or drink liquids after midnight Monday September 12.   Take these medicines the morning of surgery with A SIP OF WATER Clonazepam (Klonopin) if needed. Gabapentin (Neurontin) if needed. Provera (Medroxyprogesterone) if needed.  Omeprazole (Prilosec) if needed. Sertraline (zoloft) if needed. Methocarbamol (robaxin) if needed. Oxycodone HCl (pain pill) if needed.   STOP: ALL Vitamins, Supplements, Effient and Herbal Medications, Fish Oils, Aspirins, NSAIDs (Nonsteroidal Anti-inflammatories such as Ibuprofen, Aleve, or Advil), and Goody's/BC Powders today until after surgery as directed by your physician.  This includes: Celecoxib (celebrex).    Do not wear jewelry, make-up or nail polish.  Do not wear lotions, powders, or perfumes.  You may wear deodorant.  Do not shave 48 hours prior to surgery.  Men may shave face and neck.  Do not bring valuables to the hospital.  [facility] is not responsible for any belongings or valuables.  Contacts, dentures or bridgework may not be worn into surgery.  Leave your suitcase in the car.  After surgery it may be brought to your room.  For patients admitted to the hospital, discharge time will be determined by your treatment  team.  Patients discharged the day of surgery will not be allowed to drive home.   Special instructions: Please follow these instructions carefully:  1. Shower with CHG Soap the night before surgery and the morning of Surgery. 2. If you choose to wash your hair, wash your hair first as usual with your normal shampoo. 3. After you shampoo, rinse your hair and body thoroughly to remove the Shampoo. 4. Use CHG as you would any other liquid soap. You can apply chg directly to the skin and wash gently with scrungie or a clean washcloth. 5. Apply the CHG Soap to your body ONLY FROM THE NECK DOWN. Do not use on open wounds or open sores. Avoid contact with your eyes, ears, mouth and genitals (private parts). Wash genitals (private parts) with your normal soap. 6. Wash thoroughly, paying special attention to the area where your surgery will be performed. 7. Thoroughly rinse your body with warm water from the neck down. 8. DO NOT shower/wash with your normal soap after using and rinsing off the CHG Soap. 9. Pat yourself dry with a clean towel.  10. Wear clean pajamas.  11. Place clean sheets on your bed the night of your first shower and do not sleep with pets.  Day of Surgery  Do not apply any lotions/deodorants the morning of surgery. Please wear clean clothes to the hospital/surgery center.    Please read over the following fact sheets that you were given. Pain Booklet, Coughing and Deep Breathing, Blood Transfusion Information, MRSA Information and Surgical Site Infection Prevention

## 2015-01-10 NOTE — Progress Notes (Signed)
Patient's PCP is Dr. Anitra Lauth.  ECHO: 03/08/14 Stress: 03/08/14 EKG: 02/27/2014  All in Epic.  Pt denies ever having had a cardiac catheterization and states she does not currently follow-up with a cardiologist.

## 2015-01-11 NOTE — Progress Notes (Signed)
Anesthesia Chart Review:  Pt is 54 year old female scheduled for L5-S1 PLIF on 01/15/2015 with Dr. Ellene Route.   PMH includes: HTN, hyperlipidemia, OSA, metabolic syndrome, anemia, cervical cancer, GERD. Never smoker. BMI 42.   Medications include: lasix, losartan-hctz, prilosec.   Preoperative labs reviewed.    Chest x-ray 03/01/2014 reviewed. No active cardiopulmonary disease.   EKG 02/27/2014: sinus rhythm.   Echo 03/08/2014:  - Left ventricle: The cavity size was normal. There was mild concentric hypertrophy. Systolic function was normal. The estimated ejection fraction was in the range of 60% to 65%. Wall motion was normal; there were no regional wall motion abnormalities. - Mitral valve: There was trivial regurgitation. - Atrial septum: There was increased thickness of the septum, consistent with lipomatous hypertrophy. - Tricuspid valve: There was trivial regurgitation.  Nuclear stress test 03/08/2014:  -Low risk stress nuclear study with no definite perfusion defect. However, the TID ratio is visually elevated and measures 1.33. Need clinical correlation. Balanced ischemia is a concern, albeit unlikely. LV Ejection Fraction: 69%. LV Wall Motion: NL LV Function; NL Wall Motion.   Saw Dr. Mare Ferrari with cardiology 02/2014 for chest tightness. Echo and stress test "good" per Dr. Mare Ferrari. Pt does has not seen cardiology since.   If no changes, I anticipate pt can proceed with surgery as scheduled.   Willeen Cass, FNP-BC [facility] Short Stay Surgical Center/Anesthesiology Phone: [phone] 01/11/2015 12:42 PM

## 2015-01-15 ENCOUNTER — Inpatient Hospital Stay (HOSPITAL_COMMUNITY): Payer: BC Managed Care – PPO | Admitting: Anesthesiology

## 2015-01-15 ENCOUNTER — Inpatient Hospital Stay (HOSPITAL_COMMUNITY)
Admission: RE | Admit: 2015-01-15 | Discharge: 2015-01-19 | DRG: 460 | Disposition: A | Discharge status: Home Health | Payer: BC Managed Care – PPO | Source: Ambulatory Visit | Attending: Neurological Surgery | Admitting: Neurological Surgery

## 2015-01-15 ENCOUNTER — Inpatient Hospital Stay (HOSPITAL_COMMUNITY): Payer: BC Managed Care – PPO | Admitting: Emergency Medicine

## 2015-01-15 ENCOUNTER — Encounter (HOSPITAL_COMMUNITY): Payer: Self-pay | Admitting: *Deleted

## 2015-01-15 ENCOUNTER — Encounter (HOSPITAL_COMMUNITY)
Admission: RE | Disposition: A | Discharge status: Home Health | Payer: BC Managed Care – PPO | Source: Ambulatory Visit | Attending: Neurological Surgery

## 2015-01-15 ENCOUNTER — Inpatient Hospital Stay (HOSPITAL_COMMUNITY): Payer: BC Managed Care – PPO

## 2015-01-15 DIAGNOSIS — G4730 Sleep apnea, unspecified: Secondary | ICD-10-CM | POA: Diagnosis present

## 2015-01-15 DIAGNOSIS — M4806 Spinal stenosis, lumbar region: Secondary | ICD-10-CM | POA: Diagnosis present

## 2015-01-15 DIAGNOSIS — M5117 Intervertebral disc disorders with radiculopathy, lumbosacral region: Principal | ICD-10-CM | POA: Diagnosis present

## 2015-01-15 DIAGNOSIS — Z419 Encounter for procedure for purposes other than remedying health state, unspecified: Secondary | ICD-10-CM

## 2015-01-15 DIAGNOSIS — M4310 Spondylolisthesis, site unspecified: Secondary | ICD-10-CM | POA: Diagnosis present

## 2015-01-15 DIAGNOSIS — K219 Gastro-esophageal reflux disease without esophagitis: Secondary | ICD-10-CM | POA: Diagnosis present

## 2015-01-15 DIAGNOSIS — M48061 Spinal stenosis, lumbar region without neurogenic claudication: Secondary | ICD-10-CM | POA: Diagnosis present

## 2015-01-15 DIAGNOSIS — I10 Essential (primary) hypertension: Secondary | ICD-10-CM | POA: Diagnosis present

## 2015-01-15 DIAGNOSIS — Z8541 Personal history of malignant neoplasm of cervix uteri: Secondary | ICD-10-CM

## 2015-01-15 DIAGNOSIS — Z6841 Body Mass Index (BMI) 40.0 and over, adult: Secondary | ICD-10-CM

## 2015-01-15 DIAGNOSIS — M5416 Radiculopathy, lumbar region: Secondary | ICD-10-CM | POA: Diagnosis present

## 2015-01-15 DIAGNOSIS — M797 Fibromyalgia: Secondary | ICD-10-CM | POA: Diagnosis present

## 2015-01-15 LAB — HCG, SERUM, QUALITATIVE: PREG SERUM: NEGATIVE

## 2015-01-15 SURGERY — POSTERIOR LUMBAR FUSION 1 LEVEL
Anesthesia: General | Site: Back

## 2015-01-15 MED ORDER — SODIUM CHLORIDE 0.9 % IV SOLN
250.0000 mL | INTRAVENOUS | Status: DC
Start: 2015-01-15 — End: 2015-01-19

## 2015-01-15 MED ORDER — PROPOFOL 10 MG/ML IV BOLUS
INTRAVENOUS | Status: AC
  Filled 2015-01-15: qty 20

## 2015-01-15 MED ORDER — HYDROCODONE-ACETAMINOPHEN 5-325 MG PO TABS: 1.0000 | ORAL_TABLET | ORAL | Status: DC | PRN

## 2015-01-15 MED ORDER — LOSARTAN POTASSIUM 50 MG PO TABS
100.0000 mg | ORAL_TABLET | Freq: Every day | ORAL | Status: DC
  Administered 2015-01-16 – 2015-01-19 (×2): 100 mg via ORAL
  Filled 2015-01-15 (×4): qty 2

## 2015-01-15 MED ORDER — FLUTICASONE PROPIONATE 50 MCG/ACT NA SUSP
2.0000 | Freq: Every day | NASAL | Status: DC
  Administered 2015-01-15 – 2015-01-18 (×4): 2 via NASAL
  Filled 2015-01-15: qty 16

## 2015-01-15 MED ORDER — SENNA 8.6 MG PO TABS
1.0000 | ORAL_TABLET | Freq: Two times a day (BID) | ORAL | Status: DC
  Administered 2015-01-15 – 2015-01-19 (×8): 8.6 mg via ORAL
  Filled 2015-01-15 (×8): qty 1

## 2015-01-15 MED ORDER — FLEET ENEMA 7-19 GM/118ML RE ENEM: 1.0000 | ENEMA | Freq: Once | RECTAL | Status: DC | PRN

## 2015-01-15 MED ORDER — KETOROLAC TROMETHAMINE 15 MG/ML IJ SOLN
15.0000 mg | Freq: Four times a day (QID) | INTRAMUSCULAR | Status: AC
  Administered 2015-01-15 – 2015-01-16 (×5): 15 mg via INTRAVENOUS
  Filled 2015-01-15 (×5): qty 1

## 2015-01-15 MED ORDER — METHOCARBAMOL 500 MG PO TABS
500.0000 mg | ORAL_TABLET | Freq: Every day | ORAL | Status: DC
  Administered 2015-01-15 – 2015-01-18 (×4): 500 mg via ORAL
  Filled 2015-01-15 (×4): qty 1

## 2015-01-15 MED ORDER — FUROSEMIDE 40 MG PO TABS: 40.0000 mg | ORAL_TABLET | Freq: Every day | ORAL | Status: DC | PRN

## 2015-01-15 MED ORDER — PHENYLEPHRINE 40 MCG/ML (10ML) SYRINGE FOR IV PUSH (FOR BLOOD PRESSURE SUPPORT)
PREFILLED_SYRINGE | INTRAVENOUS | Status: AC
  Filled 2015-01-15: qty 10

## 2015-01-15 MED ORDER — SODIUM CHLORIDE 0.9 % IJ SOLN: 3.0000 mL | INTRAMUSCULAR | Status: DC | PRN

## 2015-01-15 MED ORDER — LIDOCAINE HCL (CARDIAC) 20 MG/ML IV SOLN
INTRAVENOUS | Status: AC
  Filled 2015-01-15: qty 10

## 2015-01-15 MED ORDER — OXYCODONE-ACETAMINOPHEN 5-325 MG PO TABS
1.0000 | ORAL_TABLET | ORAL | Status: DC | PRN
  Administered 2015-01-15 – 2015-01-19 (×11): 2 via ORAL
  Filled 2015-01-15 (×10): qty 2

## 2015-01-15 MED ORDER — ONDANSETRON HCL 4 MG/2ML IJ SOLN
4.0000 mg | Freq: Once | INTRAMUSCULAR | Status: AC | PRN
  Administered 2015-01-15: 4 mg via INTRAVENOUS

## 2015-01-15 MED ORDER — SODIUM CHLORIDE 0.9 % IR SOLN
Status: DC | PRN
  Administered 2015-01-15: 09:00:00

## 2015-01-15 MED ORDER — THROMBIN 5000 UNITS EX SOLR
CUTANEOUS | Status: DC | PRN
  Administered 2015-01-15: 09:00:00 via TOPICAL

## 2015-01-15 MED ORDER — PHENYLEPHRINE HCL 10 MG/ML IJ SOLN
10.0000 mg | INTRAVENOUS | Status: DC | PRN
  Administered 2015-01-15 (×2): 50 ug/min via INTRAVENOUS

## 2015-01-15 MED ORDER — NEOSTIGMINE METHYLSULFATE 10 MG/10ML IV SOLN
INTRAVENOUS | Status: DC | PRN
  Administered 2015-01-15: 4 mg via INTRAVENOUS

## 2015-01-15 MED ORDER — OXYBUTYNIN CHLORIDE ER 10 MG PO TB24
10.0000 mg | ORAL_TABLET | Freq: Every evening | ORAL | Status: DC | PRN
  Filled 2015-01-15: qty 1

## 2015-01-15 MED ORDER — SENNA-DOCUSATE SODIUM 8.6-50 MG PO TABS
2.0000 | ORAL_TABLET | Freq: Every day | ORAL | Status: DC
Start: 2015-01-15 — End: 2015-01-15

## 2015-01-15 MED ORDER — HYDROMORPHONE HCL 1 MG/ML IJ SOLN
INTRAMUSCULAR | Status: AC
  Filled 2015-01-15: qty 1

## 2015-01-15 MED ORDER — LIDOCAINE HCL (CARDIAC) 20 MG/ML IV SOLN
INTRAVENOUS | Status: DC | PRN
  Administered 2015-01-15: 20 mg via INTRAVENOUS

## 2015-01-15 MED ORDER — CEFAZOLIN SODIUM-DEXTROSE 2-3 GM-% IV SOLR
2.0000 g | INTRAVENOUS | Status: AC
  Administered 2015-01-15: 2 g via INTRAVENOUS

## 2015-01-15 MED ORDER — SCOPOLAMINE 1 MG/3DAYS TD PT72
MEDICATED_PATCH | TRANSDERMAL | Status: DC | PRN
  Administered 2015-01-15: 1 via TRANSDERMAL

## 2015-01-15 MED ORDER — LIDOCAINE-EPINEPHRINE 1 %-1:100000 IJ SOLN
INTRAMUSCULAR | Status: DC | PRN
  Administered 2015-01-15: 10 mL

## 2015-01-15 MED ORDER — ROCURONIUM BROMIDE 50 MG/5ML IV SOLN
INTRAVENOUS | Status: AC
  Filled 2015-01-15: qty 1

## 2015-01-15 MED ORDER — DOCUSATE SODIUM 100 MG PO CAPS
100.0000 mg | ORAL_CAPSULE | Freq: Two times a day (BID) | ORAL | Status: DC
  Administered 2015-01-15 – 2015-01-19 (×8): 100 mg via ORAL
  Filled 2015-01-15 (×8): qty 1

## 2015-01-15 MED ORDER — ROCURONIUM BROMIDE 100 MG/10ML IV SOLN
INTRAVENOUS | Status: DC | PRN
  Administered 2015-01-15: 50 mg via INTRAVENOUS

## 2015-01-15 MED ORDER — SODIUM CHLORIDE 0.9 % IJ SOLN
INTRAMUSCULAR | Status: AC
  Filled 2015-01-15: qty 10

## 2015-01-15 MED ORDER — FENTANYL CITRATE (PF) 100 MCG/2ML IJ SOLN
25.0000 ug | INTRAMUSCULAR | Status: DC | PRN
  Administered 2015-01-15 (×3): 50 ug via INTRAVENOUS

## 2015-01-15 MED ORDER — METHOCARBAMOL 500 MG PO TABS
250.0000 mg | ORAL_TABLET | Freq: Every day | ORAL | Status: DC
  Administered 2015-01-16 – 2015-01-19 (×4): 250 mg via ORAL
  Filled 2015-01-15 (×4): qty 1

## 2015-01-15 MED ORDER — PROPOFOL 10 MG/ML IV BOLUS
INTRAVENOUS | Status: DC | PRN
  Administered 2015-01-15: 170 mg via INTRAVENOUS

## 2015-01-15 MED ORDER — METHOCARBAMOL 500 MG PO TABS
ORAL_TABLET | ORAL | Status: AC
  Filled 2015-01-15: qty 1

## 2015-01-15 MED ORDER — HYDROMORPHONE HCL 1 MG/ML IJ SOLN
0.5000 mg | INTRAMUSCULAR | Status: DC | PRN
  Administered 2015-01-16 – 2015-01-18 (×2): 1 mg via INTRAVENOUS
  Filled 2015-01-15 (×2): qty 1

## 2015-01-15 MED ORDER — GLYCOPYRROLATE 0.2 MG/ML IJ SOLN
INTRAMUSCULAR | Status: DC | PRN
  Administered 2015-01-15: 0.2 mg via INTRAVENOUS
  Administered 2015-01-15: 0.6 mg via INTRAVENOUS

## 2015-01-15 MED ORDER — LIDOCAINE HCL 4 % MT SOLN
OROMUCOSAL | Status: DC | PRN
  Administered 2015-01-15: 4 mL via TOPICAL

## 2015-01-15 MED ORDER — HYDROMORPHONE HCL 1 MG/ML IJ SOLN
0.2500 mg | INTRAMUSCULAR | Status: DC | PRN
  Administered 2015-01-15 (×4): 0.5 mg via INTRAVENOUS

## 2015-01-15 MED ORDER — GABAPENTIN 300 MG PO CAPS
900.0000 mg | ORAL_CAPSULE | Freq: Three times a day (TID) | ORAL | Status: DC
  Administered 2015-01-15 – 2015-01-19 (×11): 900 mg via ORAL
  Filled 2015-01-15 (×12): qty 3

## 2015-01-15 MED ORDER — KETOROLAC TROMETHAMINE 30 MG/ML IJ SOLN
30.0000 mg | Freq: Once | INTRAMUSCULAR | Status: AC
  Administered 2015-01-15: 30 mg via INTRAVENOUS

## 2015-01-15 MED ORDER — EPHEDRINE SULFATE 50 MG/ML IJ SOLN
INTRAMUSCULAR | Status: DC | PRN
  Administered 2015-01-15: 10 mg via INTRAVENOUS
  Administered 2015-01-15: 5 mg via INTRAVENOUS
  Administered 2015-01-15 (×3): 10 mg via INTRAVENOUS

## 2015-01-15 MED ORDER — DEXAMETHASONE SODIUM PHOSPHATE 4 MG/ML IJ SOLN
6.0000 mg | Freq: Once | INTRAMUSCULAR | Status: AC
  Administered 2015-01-15: 6 mg via INTRAVENOUS

## 2015-01-15 MED ORDER — ACETAMINOPHEN 325 MG PO TABS: 650.0000 mg | ORAL_TABLET | ORAL | Status: DC | PRN

## 2015-01-15 MED ORDER — KETOROLAC TROMETHAMINE 30 MG/ML IJ SOLN
INTRAMUSCULAR | Status: AC
  Filled 2015-01-15: qty 1

## 2015-01-15 MED ORDER — MIDAZOLAM HCL 2 MG/2ML IJ SOLN
INTRAMUSCULAR | Status: AC
  Filled 2015-01-15: qty 4

## 2015-01-15 MED ORDER — OXYCODONE HCL 5 MG PO TABS
10.0000 mg | ORAL_TABLET | Freq: Four times a day (QID) | ORAL | Status: DC | PRN
  Administered 2015-01-15 – 2015-01-18 (×4): 20 mg via ORAL
  Administered 2015-01-18: 10 mg via ORAL
  Administered 2015-01-18 – 2015-01-19 (×3): 20 mg via ORAL
  Filled 2015-01-15 (×2): qty 4
  Filled 2015-01-15: qty 2
  Filled 2015-01-15 (×13): qty 4

## 2015-01-15 MED ORDER — LOSARTAN POTASSIUM-HCTZ 100-25 MG PO TABS: 1.0000 | ORAL_TABLET | Freq: Every day | ORAL | Status: DC

## 2015-01-15 MED ORDER — LACTATED RINGERS IV SOLN
INTRAVENOUS | Status: DC
  Administered 2015-01-15: 10:00:00 via INTRAVENOUS
  Administered 2015-01-15: 50 mL/h via INTRAVENOUS
  Administered 2015-01-15 (×2): via INTRAVENOUS

## 2015-01-15 MED ORDER — FENTANYL CITRATE (PF) 100 MCG/2ML IJ SOLN
INTRAMUSCULAR | Status: AC
  Filled 2015-01-15: qty 2

## 2015-01-15 MED ORDER — BUPIVACAINE HCL (PF) 0.5 % IJ SOLN
INTRAMUSCULAR | Status: DC | PRN
  Administered 2015-01-15: 10 mL

## 2015-01-15 MED ORDER — BISACODYL 10 MG RE SUPP
10.0000 mg | Freq: Every day | RECTAL | Status: DC | PRN
  Administered 2015-01-18: 10 mg via RECTAL
  Filled 2015-01-15: qty 1

## 2015-01-15 MED ORDER — ARTIFICIAL TEARS OP OINT
TOPICAL_OINTMENT | OPHTHALMIC | Status: AC
  Filled 2015-01-15: qty 3.5

## 2015-01-15 MED ORDER — FENTANYL CITRATE (PF) 100 MCG/2ML IJ SOLN
INTRAMUSCULAR | Status: DC | PRN
  Administered 2015-01-15: 50 ug via INTRAVENOUS
  Administered 2015-01-15: 100 ug via INTRAVENOUS

## 2015-01-15 MED ORDER — MIDAZOLAM HCL 5 MG/5ML IJ SOLN
INTRAMUSCULAR | Status: DC | PRN
  Administered 2015-01-15 (×2): 1 mg via INTRAVENOUS

## 2015-01-15 MED ORDER — THROMBIN 20000 UNITS EX SOLR
CUTANEOUS | Status: DC | PRN
  Administered 2015-01-15: 09:00:00 via TOPICAL

## 2015-01-15 MED ORDER — 0.9 % SODIUM CHLORIDE (POUR BTL) OPTIME
TOPICAL | Status: DC | PRN
  Administered 2015-01-15: 1000 mL

## 2015-01-15 MED ORDER — PHENOL 1.4 % MT LIQD
1.0000 | OROMUCOSAL | Status: DC | PRN
  Administered 2015-01-16: 1 via OROMUCOSAL
  Filled 2015-01-15: qty 177

## 2015-01-15 MED ORDER — SODIUM CHLORIDE 0.9 % IV SOLN
INTRAVENOUS | Status: DC
  Administered 2015-01-15 – 2015-01-16 (×3): via INTRAVENOUS

## 2015-01-15 MED ORDER — ONDANSETRON HCL 4 MG/2ML IJ SOLN
INTRAMUSCULAR | Status: DC | PRN
  Administered 2015-01-15: 4 mg via INTRAVENOUS

## 2015-01-15 MED ORDER — NEOSTIGMINE METHYLSULFATE 10 MG/10ML IV SOLN
INTRAVENOUS | Status: AC
  Filled 2015-01-15: qty 1

## 2015-01-15 MED ORDER — EPHEDRINE SULFATE 50 MG/ML IJ SOLN
INTRAMUSCULAR | Status: AC
  Filled 2015-01-15: qty 1

## 2015-01-15 MED ORDER — SCOPOLAMINE 1 MG/3DAYS TD PT72
MEDICATED_PATCH | TRANSDERMAL | Status: AC
  Filled 2015-01-15: qty 1

## 2015-01-15 MED ORDER — SUCCINYLCHOLINE CHLORIDE 20 MG/ML IJ SOLN
INTRAMUSCULAR | Status: AC
  Filled 2015-01-15: qty 1

## 2015-01-15 MED ORDER — METHOCARBAMOL 1000 MG/10ML IJ SOLN
500.0000 mg | Freq: Four times a day (QID) | INTRAVENOUS | Status: DC | PRN
  Filled 2015-01-15: qty 5

## 2015-01-15 MED ORDER — CEFAZOLIN SODIUM-DEXTROSE 2-3 GM-% IV SOLR
INTRAVENOUS | Status: AC
  Filled 2015-01-15: qty 50

## 2015-01-15 MED ORDER — CLONAZEPAM 1 MG PO TABS
1.0000 mg | ORAL_TABLET | Freq: Two times a day (BID) | ORAL | Status: DC
  Administered 2015-01-15 – 2015-01-19 (×8): 1 mg via ORAL
  Filled 2015-01-15 (×8): qty 1

## 2015-01-15 MED ORDER — MENTHOL 3 MG MT LOZG
1.0000 | LOZENGE | OROMUCOSAL | Status: DC | PRN
  Filled 2015-01-15: qty 9

## 2015-01-15 MED ORDER — OXYCODONE-ACETAMINOPHEN 5-325 MG PO TABS
ORAL_TABLET | ORAL | Status: AC
  Filled 2015-01-15: qty 2

## 2015-01-15 MED ORDER — ARTIFICIAL TEARS OP OINT
TOPICAL_OINTMENT | OPHTHALMIC | Status: DC | PRN
  Administered 2015-01-15: 1 via OPHTHALMIC

## 2015-01-15 MED ORDER — PANTOPRAZOLE SODIUM 40 MG PO TBEC
40.0000 mg | DELAYED_RELEASE_TABLET | Freq: Every day | ORAL | Status: DC
  Administered 2015-01-16 – 2015-01-19 (×4): 40 mg via ORAL
  Filled 2015-01-15 (×4): qty 1

## 2015-01-15 MED ORDER — METHOCARBAMOL 500 MG PO TABS
500.0000 mg | ORAL_TABLET | Freq: Four times a day (QID) | ORAL | Status: DC | PRN
  Administered 2015-01-15 (×2): 500 mg via ORAL
  Filled 2015-01-15: qty 1

## 2015-01-15 MED ORDER — ONDANSETRON HCL 4 MG/2ML IJ SOLN: 4.0000 mg | INTRAMUSCULAR | Status: DC | PRN

## 2015-01-15 MED ORDER — ONDANSETRON HCL 4 MG/2ML IJ SOLN
INTRAMUSCULAR | Status: AC
Start: 2015-01-15 — End: 2015-01-15
  Filled 2015-01-15: qty 2

## 2015-01-15 MED ORDER — CEFAZOLIN SODIUM 1-5 GM-% IV SOLN
1.0000 g | Freq: Three times a day (TID) | INTRAVENOUS | Status: AC
  Administered 2015-01-15 – 2015-01-16 (×2): 1 g via INTRAVENOUS
  Filled 2015-01-15 (×3): qty 50

## 2015-01-15 MED ORDER — HYDROCHLOROTHIAZIDE 25 MG PO TABS
25.0000 mg | ORAL_TABLET | Freq: Every day | ORAL | Status: DC
  Administered 2015-01-16 – 2015-01-19 (×2): 25 mg via ORAL
  Filled 2015-01-15 (×4): qty 1

## 2015-01-15 MED ORDER — ACETAMINOPHEN 650 MG RE SUPP: 650.0000 mg | RECTAL | Status: DC | PRN

## 2015-01-15 MED ORDER — DEXAMETHASONE SODIUM PHOSPHATE 4 MG/ML IJ SOLN
INTRAMUSCULAR | Status: DC | PRN
  Administered 2015-01-15: 4 mg via INTRAVENOUS

## 2015-01-15 MED ORDER — FENTANYL CITRATE (PF) 250 MCG/5ML IJ SOLN
INTRAMUSCULAR | Status: AC
  Filled 2015-01-15: qty 5

## 2015-01-15 MED ORDER — DIPHENHYDRAMINE HCL 50 MG/ML IJ SOLN
INTRAMUSCULAR | Status: AC
  Administered 2015-01-15: 12.5 mg
  Filled 2015-01-15: qty 1

## 2015-01-15 MED ORDER — POLYETHYLENE GLYCOL 3350 17 G PO PACK
17.0000 g | PACK | Freq: Every day | ORAL | Status: DC | PRN
  Administered 2015-01-18: 17 g via ORAL
  Filled 2015-01-15: qty 1

## 2015-01-15 MED ORDER — SERTRALINE HCL 50 MG PO TABS
50.0000 mg | ORAL_TABLET | Freq: Every day | ORAL | Status: DC
  Administered 2015-01-15 – 2015-01-19 (×5): 50 mg via ORAL
  Filled 2015-01-15 (×5): qty 1

## 2015-01-15 MED ORDER — DEXAMETHASONE SODIUM PHOSPHATE 10 MG/ML IJ SOLN
INTRAMUSCULAR | Status: AC
  Filled 2015-01-15: qty 1

## 2015-01-15 MED ORDER — ONDANSETRON HCL 4 MG/2ML IJ SOLN
INTRAMUSCULAR | Status: AC
  Filled 2015-01-15: qty 2

## 2015-01-15 MED ORDER — GLYCOPYRROLATE 0.2 MG/ML IJ SOLN
INTRAMUSCULAR | Status: AC
  Filled 2015-01-15: qty 4

## 2015-01-15 MED ORDER — SODIUM CHLORIDE 0.9 % IJ SOLN
3.0000 mL | Freq: Two times a day (BID) | INTRAMUSCULAR | Status: DC
  Administered 2015-01-15 – 2015-01-18 (×4): 3 mL via INTRAVENOUS

## 2015-01-15 MED FILL — Heparin Sodium (Porcine) Inj 1000 Unit/ML: INTRAMUSCULAR | Qty: 30 | Status: AC

## 2015-01-15 MED FILL — Sodium Chloride IV Soln 0.9%: INTRAVENOUS | Qty: 1000 | Status: AC

## 2015-01-15 SURGICAL SUPPLY — 61 items
BAG DECANTER FOR FLEXI CONT (MISCELLANEOUS) ×3 IMPLANT
BLADE CLIPPER SURG (BLADE) IMPLANT
BONE MATRIX OSTEOCEL PRO MED (Bone Implant) ×3 IMPLANT
BUR MATCHSTICK NEURO 3.0 LAGG (BURR) ×3 IMPLANT
CAGE COROENT LRG 8X9X28M SPINE (Cage) ×6 IMPLANT
CANISTER SUCT 3000ML PPV (MISCELLANEOUS) ×3 IMPLANT
CONT SPEC 4OZ CLIKSEAL STRL BL (MISCELLANEOUS) ×3 IMPLANT
COVER BACK TABLE 60X90IN (DRAPES) ×3 IMPLANT
DECANTER SPIKE VIAL GLASS SM (MISCELLANEOUS) ×3 IMPLANT
DERMABOND ADVANCED (GAUZE/BANDAGES/DRESSINGS) ×2
DERMABOND ADVANCED .7 DNX12 (GAUZE/BANDAGES/DRESSINGS) ×1 IMPLANT
DRAPE C-ARM 42X72 X-RAY (DRAPES) ×6 IMPLANT
DRAPE LAPAROTOMY 100X72X124 (DRAPES) ×3 IMPLANT
DRAPE POUCH INSTRU U-SHP 10X18 (DRAPES) ×3 IMPLANT
DRAPE PROXIMA HALF (DRAPES) IMPLANT
DRSG OPSITE POSTOP 4X6 (GAUZE/BANDAGES/DRESSINGS) ×3 IMPLANT
DURAPREP 26ML APPLICATOR (WOUND CARE) ×3 IMPLANT
ELECT REM PT RETURN 9FT ADLT (ELECTROSURGICAL) ×3
ELECTRODE REM PT RTRN 9FT ADLT (ELECTROSURGICAL) ×1 IMPLANT
GAUZE SPONGE 4X4 12PLY STRL (GAUZE/BANDAGES/DRESSINGS) IMPLANT
GAUZE SPONGE 4X4 16PLY XRAY LF (GAUZE/BANDAGES/DRESSINGS) IMPLANT
GLOVE BIOGEL PI IND STRL 8.5 (GLOVE) ×2 IMPLANT
GLOVE BIOGEL PI INDICATOR 8.5 (GLOVE) ×4
GLOVE ECLIPSE 7.5 STRL STRAW (GLOVE) ×12 IMPLANT
GLOVE ECLIPSE 8.5 STRL (GLOVE) ×6 IMPLANT
GLOVE ECLIPSE 9.0 STRL (GLOVE) ×3 IMPLANT
GLOVE EXAM NITRILE LRG STRL (GLOVE) IMPLANT
GLOVE EXAM NITRILE MD LF STRL (GLOVE) IMPLANT
GLOVE EXAM NITRILE XL STR (GLOVE) IMPLANT
GLOVE EXAM NITRILE XS STR PU (GLOVE) IMPLANT
GLOVE INDICATOR 7.5 STRL GRN (GLOVE) ×3 IMPLANT
GOWN STRL REUS W/ TWL LRG LVL3 (GOWN DISPOSABLE) IMPLANT
GOWN STRL REUS W/ TWL XL LVL3 (GOWN DISPOSABLE) ×1 IMPLANT
GOWN STRL REUS W/TWL 2XL LVL3 (GOWN DISPOSABLE) ×9 IMPLANT
GOWN STRL REUS W/TWL LRG LVL3 (GOWN DISPOSABLE)
GOWN STRL REUS W/TWL XL LVL3 (GOWN DISPOSABLE) ×2
HEMOSTAT POWDER KIT SURGIFOAM (HEMOSTASIS) ×6 IMPLANT
KIT BASIN OR (CUSTOM PROCEDURE TRAY) ×3 IMPLANT
KIT ROOM TURNOVER OR (KITS) ×3 IMPLANT
MILL MEDIUM DISP (BLADE) ×3 IMPLANT
NEEDLE HYPO 22GX1.5 SAFETY (NEEDLE) ×3 IMPLANT
NS IRRIG 1000ML POUR BTL (IV SOLUTION) ×3 IMPLANT
PACK LAMINECTOMY NEURO (CUSTOM PROCEDURE TRAY) ×3 IMPLANT
PAD ARMBOARD 7.5X6 YLW CONV (MISCELLANEOUS) ×9 IMPLANT
PATTIES SURGICAL .5 X1 (DISPOSABLE) ×3 IMPLANT
ROD RELINE-O LORD 5.5X35MM (Rod) ×6 IMPLANT
SCREW LOCK RELINE 5.5 TULIP (Screw) ×12 IMPLANT
SCREW RELINE-O POLY 6.5X45 (Screw) ×9 IMPLANT
SCREW RELINE-O POLY 7.5X40 (Screw) ×3 IMPLANT
SPONGE LAP 4X18 X RAY DECT (DISPOSABLE) IMPLANT
SPONGE SURGIFOAM ABS GEL 100 (HEMOSTASIS) ×3 IMPLANT
SUT VIC AB 1 CT1 18XBRD ANBCTR (SUTURE) ×1 IMPLANT
SUT VIC AB 1 CT1 8-18 (SUTURE) ×2
SUT VIC AB 2-0 CP2 18 (SUTURE) ×3 IMPLANT
SUT VIC AB 3-0 SH 8-18 (SUTURE) ×3 IMPLANT
SYR 3ML LL SCALE MARK (SYRINGE) ×12 IMPLANT
TOWEL OR 17X24 6PK STRL BLUE (TOWEL DISPOSABLE) ×3 IMPLANT
TOWEL OR 17X26 10 PK STRL BLUE (TOWEL DISPOSABLE) ×3 IMPLANT
TRAP SPECIMEN MUCOUS 40CC (MISCELLANEOUS) ×3 IMPLANT
TRAY FOLEY W/METER SILVER 14FR (SET/KITS/TRAYS/PACK) ×3 IMPLANT
WATER STERILE IRR 1000ML POUR (IV SOLUTION) ×3 IMPLANT

## 2015-01-15 NOTE — Anesthesia Preprocedure Evaluation (Addendum)
Anesthesia Evaluation  Patient identified by MRN, date of birth, ID band Patient awake    Reviewed: Allergy & Precautions, NPO status , Patient's Chart, lab work & pertinent test results  History of Anesthesia Complications (+) PONV and history of anesthetic complications  Airway Mallampati: II  TM Distance: >3 FB Neck ROM: Full    Dental no notable dental hx. (+) Dental Advisory Given,    Pulmonary sleep apnea and Continuous Positive Airway Pressure Ventilation ,    Pulmonary exam normal breath sounds clear to auscultation       Cardiovascular hypertension, Pt. on medications Normal cardiovascular exam Rhythm:Regular Rate:Normal  The stress test showed no perfusion defects. The LV systolic function is good 2015   Neuro/Psych  Headaches, PSYCHIATRIC DISORDERS Anxiety Depression Bilateral neuropathy in lower extremities and back pain negative psych ROS   GI/Hepatic negative GI ROS, Neg liver ROS, GERD  ,  Endo/Other  Hypothyroidism Hyperthyroidism Morbid obesity  Renal/GU negative Renal ROS  negative genitourinary   Musculoskeletal  (+) Fibromyalgia -  Abdominal   Peds negative pediatric ROS (+)  Hematology negative hematology ROS (+)   Anesthesia Other Findings   Reproductive/Obstetrics negative OB ROS                            Anesthesia Physical Anesthesia Plan  ASA: III  Anesthesia Plan: General   Post-op Pain Management:    Induction: Intravenous  Airway Management Planned: Oral ETT  Additional Equipment:   Intra-op Plan:   Post-operative Plan: Extubation in OR  Informed Consent: I have reviewed the patients History and Physical, chart, labs and discussed the procedure including the risks, benefits and alternatives for the proposed anesthesia with the patient or authorized representative who has indicated his/her understanding and acceptance.   Dental advisory  given  Plan Discussed with: CRNA  Anesthesia Plan Comments:         Anesthesia Quick Evaluation

## 2015-01-15 NOTE — Anesthesia Procedure Notes (Signed)
Procedure Name: Intubation Date/Time: 01/15/2015 9:13 AM Performed by: Suzy Bouchard Pre-anesthesia Checklist: Patient identified, Emergency Drugs available, Suction available, Timeout performed and Patient being monitored Patient Re-evaluated:Patient Re-evaluated prior to inductionOxygen Delivery Method: Circle system utilized Preoxygenation: Pre-oxygenation with 100% oxygen Intubation Type: IV induction Ventilation: Mask ventilation without difficulty Laryngoscope Size: Miller and 2 Grade View: Grade I Tube type: Oral Laser Tube: Cuffed inflated with minimal occlusive pressure - saline Tube size: 7.0 mm Number of attempts: 1 Airway Equipment and Method: Stylet and LTA kit utilized Placement Confirmation: ETT inserted through vocal cords under direct vision,  positive ETCO2 and breath sounds checked- equal and bilateral Secured at: 22 cm Tube secured with: Tape Dental Injury: Teeth and Oropharynx as per pre-operative assessment

## 2015-01-15 NOTE — Progress Notes (Signed)
Patient ID: Sabrina [name], female   DOB: 1960/12/12, 54 y.o.   MRN: [id number] Vital signs are stable Motor function appears intact in both lower extremities Dressing is clean and dry Moderate amount of back pain rated at level of 7 currently Transfer to floor when stable

## 2015-01-15 NOTE — Transfer of Care (Signed)
Immediate Anesthesia Transfer of Care Note  Patient: Sabrina [name]  Procedure(s) Performed: Procedure(s): Lumbar five-sacral one Posterior lumbar interbody fusion (N/A)  Patient Location: PACU  Anesthesia Type:General  Level of Consciousness: awake  Airway & Oxygen Therapy: Patient Spontanous Breathing and Patient connected to nasal cannula oxygen  Post-op Assessment: Report given to RN, Post -op Vital signs reviewed and stable and Patient moving all extremities  Post vital signs: Reviewed and stable  Last Vitals:  Filed Vitals:   01/15/15 0808  BP: 142/65  Pulse: 85  Temp: 36.8 C  Resp: 20    Complications: No apparent anesthesia complications

## 2015-01-15 NOTE — Progress Notes (Signed)
Orthopedic Tech Progress Note Patient Details:  Sabrina [name] 10/03/1960 [id number] Patient was a pre-fit. Patient ID: Sabrina [name], female   DOB: [birth year], 54 y.o.   MRN: [id number]   [name] 01/15/2015, 2:56 PM

## 2015-01-15 NOTE — H&P (Signed)
CHIEF COMPLAINT:                                          Burning in both feet with lumbar radicular pain on the left side for number of years.  HISTORY OF PRESENT ILLNESS:                     Sabrina [name] is a 54 year old right-handed individual who tells me that she has had some pain with chronic lumbar radiculopathy for a long period of time.  More recently since about September of this past year, she developed burning dysesthetic pain into both feet.  She has been seen and evaluated by Dr. Harriet Masson and it is his feeling that the pain in her feet may be coming from her lumbar spine and he thus referred her here for further evaluation and that tells me that number of years ago in 2005 she had a herniated disc that was operated on by Dr. Rennis Harding.  She had good relief of her lumbar radicular pain and did well for number of years.  Nonetheless, she has been troubled with a left lumbar radicular syndrome that radiates down the buttock and left lower extremity and she finds that it becomes more intense when does increase the activity.  The burning pain in the feet however is there constantly and has been a substantial bother to her.  She has not had any new radiographs of her back in a long time.  Therefore today in the office, I obtained a new AP and lateral radiographs with flexion/extension views.  The radiographs demonstrate that she has degeneration of the disc space at L5-S1 with approximately 70% loss of height of the disc space.  The alignment of the vertebrae is quite good.  The curvature in the lateral projection shows a normal lordosis accentuated with extension and relieved with complete flexion.  Her alignment in the coronal plane is good, but it does suggest some right-sided paraspinous spasm as there is a slight curvature.  The other disc heights remain normal.  Impression on the base of the x-ray is that there is degenerative changes at L5-S1 without any other evidence for instability between  flexion/extension.  Sabrina [name] tells me that she would like to know if anything can be done to help alleviate the worst of her symptoms.  REVIEW OF SYSTEMS:                                    Notable for sinus problems, sinus headaches, leg pain while walking, leg weakness, leg pain, and neck pain.  Anxiety and depression are also noted on the 14-point review sheet.  PAST MEDICAL HISTORY:  . Current Medical Conditions:  Reveals that she has some hypertension.  She also had some reflux disease.  She had cervical cancer.  It was treated with conization and cauterization of the uterine cervix for that process.  She has sleep apnea and also has fibromyalgia.  . Prior Operations:  Previous surgeries include back surgery, plantar fasciitis surgery by Dr. Shellia Carwin in 2010, and hemorrhoidectomy and fissure surgery in 2007 and 2008.  . Medications and Allergies:  Current medications include albuterol, Celebrex, clonazepam, duloxetine, Omega 3 fatty acid supplementation, fluticasone, losartan, methocarbamol, Provigil, omeprazole, oxycodone, and Topiragen.  FAMILY HISTORY:  Reveals that her mother is a patient of mine at age 81 doing well for her age.  She has some hypertension and macular degeneration.  Father is deceased, had Alzheimer's disease and some heart disease.  SOCIAL HISTORY:                                            Her personal history reveals that she does not smoke.  She drinks alcohol in rare and social occasions.  She has had about 30-pound weight loss in the past year and she is continuing to try to lose weight.  PHYSICAL EXAMINATION:                                Currently, she is 5 feet 3 inches and 210 pounds.  On physical examination, I note that she stands straight and erect.  She walks without antalgia and has good strength in the major groups including iliopsoas, quads,and gastrocs.There is slight weakness in the tibialis anterior on the  left.  Reflexes are 1+ in the patellae and trace in the Achilles bilaterally.  Straight leg raising is negative at 80 degrees bilaterally.  Patrick's maneuver is negative bilaterally also.  Direct pressure over the anterior superior iliac crest reproduced some pain more so on the right in her back than on the left.  Rocking side-to-side does not reproduce any pain across the lumbosacral junction.  Impression: Since her initial evaluation over year and a half ago she's had progressively worsening problems with pain that have become refractory to treatment with epidural sterile injections. A recent repeat MRI demonstrates that she has advanced degenerative changes in the L5-S1 space. There is also presence of a recurrent disc herniation on the left side at L5-S1. Given the combination of marked facet hypertrophy advanced disc degenerative changes recurrent disc herniation I advised a decompression and fusion of L5-S1 which is now to be completed.

## 2015-01-15 NOTE — Anesthesia Postprocedure Evaluation (Signed)
  Anesthesia Post-op Note  Patient: Sabrina [name]  Procedure(s) Performed: Procedure(s) (LRB): Lumbar five-sacral one Posterior lumbar interbody fusion (N/A)  Patient Location: PACU  Anesthesia Type: General  Level of Consciousness: awake and alert   Airway and Oxygen Therapy: Patient Spontanous Breathing  Post-op Pain: mild  Post-op Assessment: Post-op Vital signs reviewed, Patient's Cardiovascular Status Stable, Respiratory Function Stable, Patent Airway and No signs of Nausea or vomiting  Last Vitals:  Filed Vitals:   01/15/15 1345  BP: 117/78  Pulse: 102  Temp:   Resp: 8    Post-op Vital Signs: stable   Complications: No apparent anesthesia complications

## 2015-01-15 NOTE — Op Note (Signed)
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 Anesthesia: Gen. endotracheal Indications: The patient is a 54 year old individual who years ago it had a discectomy at L5-S1 on the left she's had persistent radicular pain on the left leg and has had increasing back pain she is been treated with a number of epidural sterile injections over the past years time and these have become progressively less helpful. A recent MRI demonstrates that she now has a large extrusion of the disc at L5-S1 with advanced degenerative changes in both the facets and the disc space. She was advised regarding decompression and fusion of the L5-S1 space.  Procedure: The patient was brought to the operating room supine on a stretcher. After the smooth induction of general endotracheal anesthesia, she was turned prone. The back was prepped with alcohol and DuraPrep and draped in a sterile fashion. Midline incision was created and carried down to the lumbar dorsal fascia. The fascia was opened on either side of midline to expose the spinous processes in the lower lumbar spine. Rate urographic was obtained localizing L4-L5 first. Then by dissecting inferiorly L5-S1 was verified and bilateral laminotomies were created here removing the entire laminar arch of L5 out to the facets including the entirety inferior articulating process of L5. With this DL ligament was taken up and on the left side was noted  to be significant epidural fibrosis. By removing this and identifying the common dural tube the path of the S1 nerve root dissection was carried lateral to the nerve root to expose the disc space. The S1 nerve root was noted to be bound down under a significant mass dissecting medially to it identified some further scar tissue and dissecting laterally identified adhesion to a significant amount of disc material was removed in a piecemeal fashion and gradually the nerve root could be mobilized. Several other fragments were found from below the S1 nerve root. This allowed freedom of the S1 nerve root to be retracted medially. The disc space was then entered and it was noted to be attached to significant disc material that was herniating in the area under the disc space. The disc space was evacuated of a substantial amount of markedly degenerated disc material. Series of disc spreader was then placed into this spacewas widened to the 8 mm size. A discectomy was carried out on the contralateral side after similar laminotomy and facetectomy was performed. Here the contents of the spinal canal notably the dural tube and the S1 nerve root were more easily mobilized. Epidural veins were cauterized and divided. Once a complete discectomy was completed then the interspace was sized for appropriate size spacer was ultimately felt that a a degree lordotic 8 mm tall 28 mm long spacer would fit well into the interspace. These were prepared by filling with bone graft and under fluoroscopic guidance to spacers were placed. A total of 9 mL of bone was placed into the interspace also. This was a combination of osseous cell and autograft from the patient's laminectomies. At this point pedicle entry sites were chosen at L5 and S1 6.5 x 45 mm screws were  placed in L5 and on the right side 6.5 x 40 mm pedicle screw was placed in S1 on the left side 6.5 x 40 mm screw was placed 26mm precontoured rods were then used to connect the screws  together lateral gutters which had previously been decorticated were then packed with an additional 9 mL of bone. The construct was tightened and torqued appropriately and final radiographs were obtained identifying good alignment of the vertebrae and good fixation. With this the area was inspected carefully the L5 and the S1 nerve roots were well decompressed on both left and right sides hemostasis was well maintained and then the lumbar dorsal fascia was closed with #1 Vicryls interrupted fashion 20 mL of half percent Marcaine was injected into the paraspinous fascia at the time of closure 20 Vicryls then used to close the subcutaneous tissues and 30 Vicryls to close subject her skin patient tolerated procedure was returned to recovery room in stable condition.

## 2015-01-16 NOTE — Progress Notes (Signed)
Checked on patient, is sleep at this time.

## 2015-01-16 NOTE — Clinical Social Work Note (Signed)
CSW Consult Acknowledged:   CSW received a consult for SNF placement.  Per PT the appropriate level of care is home. CSW will sign off.       [place], MSW, [place]

## 2015-01-16 NOTE — Care Management Note (Signed)
Case Management Note  Patient Details  Name: Sabrina [name] MRN: [id number] Date of Birth: 21-Mar-1961  Subjective/Objective:                    Action/Plan: Pt admitted with lumbar stenosis. Pt has L5-S1 PLIF. Patient is from home with family. CM will continue to follow for discharge needs.  Expected Discharge Date:   (pending)               Expected Discharge Plan:  Home/Self Care  In-House Referral:     Discharge planning Services     Post Acute Care Choice:    Choice offered to:     DME Arranged:    DME Agency:     HH Arranged:    HH Agency:     Status of Service:  In process, will continue to follow  Medicare Important Message Given:    Date Medicare IM Given:    Medicare IM give by:    Date Additional Medicare IM Given:    Additional Medicare Important Message give by:     If discussed at Reader of Stay Meetings, dates discussed:    Additional Comments:  Ollen Gross, RN 01/16/2015, 10:33 AM

## 2015-01-16 NOTE — Progress Notes (Signed)
Patient ID: Sabrina [name], female   DOB: [birth year], 54 y.o.   MRN: [id number] Vital signs stable Motor function is ok Notes increased sensation in left foot. Pain at a level of 4  Concerned regarding home help after discharge. Lives with elderly mother who needs help of her own. Has not voided yet. Continue to observe and give time for getting past surgery.

## 2015-01-16 NOTE — Progress Notes (Signed)
Occupational Therapy Evaluation Patient Details Name: Sabrina [name] MRN: [id number] DOB: 14-May-1960 Today's Date: 01/16/2015    History of Present Illness Pt s/p PLIF 9/13. Pt with h/o cervical ca, previous back surgery 10 yrs ago, and fibromyalgia   Clinical Impression   Pt admitted with the above diagnoses and presents with below problem list. Pt will benefit from continued acute OT to address the below listed deficits and maximize independence with BADLs prior to d/c home. PTA pt was independent with ADLs. Pt is currently min guard for LB ADLs using AE. Session details below. OT to continue to follow acutely.      Follow Up Recommendations  Supervision/Assistance - 24 hour;No OT follow up    Equipment Recommendations  3 in 1 bedside comode    Recommendations for Other Services       Precautions / Restrictions Precautions Precautions: Back Precaution Booklet Issued: Yes (comment) Precaution Comments: pt able to state BAT precautions Required Braces or Orthoses: Spinal Brace Spinal Brace: Lumbar corset;Other (comment) (Pt reports apply in sitting position per MD.) Restrictions Weight Bearing Restrictions: No      Mobility Bed Mobility Overal bed mobility: Needs Assistance Bed Mobility: Rolling;Sidelying to Sit Rolling: Supervision Sidelying to sit: Supervision       General bed mobility comments: extra time, supervision for safety  Transfers Overall transfer level: Needs assistance Equipment used: Rolling walker (2 wheeled) Transfers: Sit to/from Stand Sit to Stand: Supervision         General transfer comment: pt attempted with and without rw. performs better with rw. Pt reporting she cannot fit rw in front of toilet at home educated on strategies for managing home setup.     Balance Overall balance assessment: Needs assistance         Standing balance support: Bilateral upper extremity supported;During functional activity Standing balance-Leahy  Scale: Fair                              ADL Overall ADL's : Needs assistance/impaired Eating/Feeding: Set up;Sitting   Grooming: Min guard;Standing   Upper Body Bathing: Set up;Sitting   Lower Body Bathing: Min guard;Sit to/from stand;With adaptive equipment Lower Body Bathing Details (indicate cue type and reason): pt with difficulty accessing feet in sitting position, educated on AE Upper Body Dressing : Set up   Lower Body Dressing: Min guard;With adaptive equipment;Sit to/from stand Lower Body Dressing Details (indicate cue type and reason): pt with difficulty accessing feet in sitting position, educated on AE Toilet Transfer: Min guard;Ambulation (3n1 over toilet)   Toileting- Clothing Manipulation and Hygiene: Min guard;Sitting/lateral lean Toileting - Clothing Manipulation Details (indicate cue type and reason): Educated on AE and strategies for pericare/toilet hygiene Tub/ Shower Transfer: Min guard;Walk-in shower;Shower seat;Rolling walker   Functional mobility during ADLs: Min guard;Rolling walker General ADL Comments: Pt completed in-room functional mobility, toilet transfer, and pericare as detailed above. Educated on AE and strategies for ADLs with back precautions. Pt noted to intermittently use rw during session. Educated on rw use and safety.      Vision     Perception     Praxis      Pertinent Vitals/Pain Pain Assessment: 0-10 Pain Score: 5  Pain Location: back, surgical site Pain Descriptors / Indicators: Constant Pain Intervention(s): Limited activity within patient's tolerance;Monitored during session;Repositioned     Hand Dominance Right   Extremity/Trunk Assessment Upper Extremity Assessment Upper Extremity Assessment: Overall WFL for tasks assessed  Lower Extremity Assessment Lower Extremity Assessment: Defer to PT evaluation   Cervical / Trunk Assessment Cervical / Trunk Assessment:  (s/p back surgery)   Communication  Communication Communication: No difficulties   Cognition Arousal/Alertness: Awake/alert Behavior During Therapy: WFL for tasks assessed/performed Overall Cognitive Status: Within Functional Limits for tasks assessed                     General Comments       Exercises       Shoulder Instructions      Home Living Family/patient expects to be discharged to:: Private residence Living Arrangements: Parent Available Help at Discharge: Family;Available 24 hours/day (33 yo mother) Type of Home: House Home Access: Stairs to enter Technical brewer of Steps: 2 Entrance Stairs-Rails: Left Home Layout: One level     Bathroom Shower/Tub: Occupational psychologist: Handicapped height Bathroom Accessibility: Yes   Home Equipment: Shower seat - built in;Adaptive equipment;Grab bars - toilet Adaptive Equipment: Reacher        Prior Functioning/Environment Level of Independence: Independent             OT Diagnosis: Acute pain   OT Problem List: Impaired balance (sitting and/or standing);Decreased knowledge of use of DME or AE;Decreased knowledge of precautions;Pain   OT Treatment/Interventions: Self-care/ADL training;DME and/or AE instruction;Therapeutic activities;Patient/family education;Balance training    OT Goals(Current goals can be found in the care plan section) Acute Rehab OT Goals Patient Stated Goal: home OT Goal Formulation: With patient Time For Goal Achievement: 01/23/15 Potential to Achieve Goals: Good ADL Goals Pt Will Perform Grooming: with modified independence;with adaptive equipment;standing Pt Will Perform Lower Body Bathing: with modified independence;with adaptive equipment;sit to/from stand Pt Will Perform Lower Body Dressing: with modified independence;with adaptive equipment;sit to/from stand Pt Will Transfer to Toilet: with modified independence;ambulating (3n1 over toilet) Pt Will Perform Toileting - Clothing Manipulation  and hygiene: with modified independence;with adaptive equipment;sit to/from stand;sitting/lateral leans Pt Will Perform Tub/Shower Transfer: Shower transfer;with modified independence;ambulating;shower seat;rolling walker  OT Frequency: Min 2X/week   Barriers to D/C:            Co-evaluation              End of Session Equipment Utilized During Treatment: Rolling walker;Back brace Nurse Communication: Other (comment) (difficulty urinating)  Activity Tolerance: Patient tolerated treatment well Patient left: in chair;with call bell/phone within reach   Time: 1057-1130 OT Time Calculation (min): 33 min Charges:  OT General Charges $OT Visit: 1 Procedure OT Evaluation $Initial OT Evaluation Tier I: 1 Procedure OT Treatments $Self Care/Home Management : 8-22 mins G-Codes:    Hortencia Pilar 2015/01/31, 11:54 AM

## 2015-01-16 NOTE — Evaluation (Signed)
Physical Therapy Evaluation Patient Details Name: Sabrina [name] MRN: [id number] DOB: [birth year] Today's Date: 01/16/2015   History of Present Illness  Pt s/p PLIF 9/13. Pt with h/o cervical ca, previous back surgery 10 yrs ago, and fibromyalgia  Clinical Impression  Patient is s/p above surgery resulting in the deficits listed below (see PT Problem List). Pt presenting with 4/10 pain, tolerated OOB mobility well with RW. Plans to go home with 52 yo mother. Patient will benefit from skilled PT to increase their independence and safety with mobility (while adhering to their precautions) to allow discharge to the venue listed below.     Follow Up Recommendations No PT follow up;Supervision for mobility/OOB    Equipment Recommendations  Rolling walker with 5" wheels    Recommendations for Other Services       Precautions / Restrictions Precautions Precautions: Back Precaution Booklet Issued: Yes (comment) Precaution Comments: pt with verbal understanding and able to recall at end of session but required v/c's t/o session to adhere to them Required Braces or Orthoses: Spinal Brace Spinal Brace: Lumbar corset Restrictions Weight Bearing Restrictions: No      Mobility  Bed Mobility Overal bed mobility: Needs Assistance Bed Mobility: Rolling;Sidelying to Sit Rolling: Supervision Sidelying to sit: Supervision       General bed mobility comments: increased time, v/c's for technique  Transfers Overall transfer level: Needs assistance Equipment used: Rolling walker (2 wheeled) Transfers: Sit to/from Stand Sit to Stand: Supervision         General transfer comment: max v/c's to push up from bed not pull up from walker  Ambulation/Gait Ambulation/Gait assistance: Supervision Ambulation Distance (Feet): 100 Feet Assistive device: Rolling walker (2 wheeled) Gait Pattern/deviations: Step-through pattern;Antalgic Gait velocity: decreased Gait velocity interpretation:  Below normal speed for age/gender General Gait Details: increased UE WBing on walker  Stairs            Wheelchair Mobility    Modified Rankin (Stroke Patients Only)       Balance Overall balance assessment: No apparent balance deficits (not formally assessed) (benefits from RW for amb due to pain)                                           Pertinent Vitals/Pain Pain Assessment: 0-10 Pain Score: 4  Pain Location: low back Pain Descriptors / Indicators: Constant Pain Intervention(s): RN gave pain meds during session    Home Living Family/patient expects to be discharged to:: Private residence Living Arrangements: Parent (pt taking care of her 38 yo mother) Available Help at Discharge: Family;Available 24 hours/day (5 yo mother) Type of Home: House Home Access: Stairs to enter Entrance Stairs-Rails: Left Entrance Stairs-Number of Steps: 2 Home Layout: One level Home Equipment: Shower seat - built in (pt's mother has RW but uses it)      Prior Function Level of Independence: Independent               Hand Dominance   Dominant Hand: Right    Extremity/Trunk Assessment   Upper Extremity Assessment: Overall WFL for tasks assessed           Lower Extremity Assessment: Overall WFL for tasks assessed (not formally tested due to onset of back pain with resistanc)      Cervical / Trunk Assessment:  (recent back surgery)  Communication   Communication: No difficulties  Cognition Arousal/Alertness: Awake/alert  Behavior During Therapy: WFL for tasks assessed/performed Overall Cognitive Status: Within Functional Limits for tasks assessed                      General Comments General comments (skin integrity, edema, etc.): pt assist to bathroom however unable to urinate    Exercises        Assessment/Plan    PT Assessment Patient needs continued PT services  PT Diagnosis Difficulty walking;Acute pain   PT Problem List  Decreased range of motion;Decreased strength;Decreased activity tolerance;Decreased balance;Decreased mobility  PT Treatment Interventions DME instruction;Gait training;Stair training;Functional mobility training;Therapeutic activities;Therapeutic exercise;Balance training   PT Goals (Current goals can be found in the Care Plan section) Acute Rehab PT Goals Patient Stated Goal: home PT Goal Formulation: With patient Time For Goal Achievement: 01/23/15 Potential to Achieve Goals: Good    Frequency Min 5X/week   Barriers to discharge        Co-evaluation               End of Session Equipment Utilized During Treatment: Gait belt;Back brace Activity Tolerance: Patient tolerated treatment well Patient left: in bed;with call bell/phone within reach (sitting EOB with RN staff t) Nurse Communication: Mobility status         Time: 3202-3343 PT Time Calculation (min) (ACUTE ONLY): 35 min   Charges:   PT Evaluation $Initial PT Evaluation Tier I: 1 Procedure PT Treatments $Gait Training: 8-22 mins   PT G CodesKingsley Callander 01/16/2015, 9:11 AM  Kittie Plater, PT, DPT Pager #: [phone] Office #: [phone]

## 2015-01-16 NOTE — Progress Notes (Signed)
Assisted patient from the bathroom, stated that she is having problems. States that her urine is not coming out, just dribbles. Patient back in the bed, medication given.

## 2015-01-16 NOTE — Progress Notes (Signed)
Patient lying in bed, pain medication and sched meds given.  New bag of fluid hung. Call light within reach

## 2015-01-16 NOTE — Progress Notes (Signed)
Pt complaint of discomfort to lower abdomen. Pt is due to void stated that she would try to go to the bathroom. After x2 attempts, pt agreed to bladder scan and straight cath. Pt tolerated well 380 ml of yellow urine emptied. Pt stated that she felt relief. Will continue to monitor.

## 2015-01-16 NOTE — Progress Notes (Signed)
Patient wanted to be moved back to her bed, call alarm in reach.  No pain or distress, wants to get some sleep

## 2015-01-17 MED ORDER — KETOROLAC TROMETHAMINE 15 MG/ML IJ SOLN
15.0000 mg | Freq: Four times a day (QID) | INTRAMUSCULAR | Status: AC
  Administered 2015-01-17 – 2015-01-18 (×5): 15 mg via INTRAVENOUS
  Filled 2015-01-17 (×5): qty 1

## 2015-01-17 MED ORDER — DEXAMETHASONE SODIUM PHOSPHATE 4 MG/ML IJ SOLN
8.0000 mg | Freq: Once | INTRAMUSCULAR | Status: AC
  Administered 2015-01-17: 8 mg via INTRAVENOUS
  Filled 2015-01-17: qty 2

## 2015-01-17 NOTE — Progress Notes (Signed)
Pt observed with therapy ambulating with rolling walker, brace on and aligned. Gait steady. Toradol administered prior to therapy. Will continue to monitor.

## 2015-01-17 NOTE — Progress Notes (Signed)
Patient ID: Sabrina [name], female   DOB: 06-Sep-1960, 54 y.o.   MRN: [id number] Slow progression but is able to void Motor function is intact Continues physical therapy occupational therapy Supportive care.

## 2015-01-17 NOTE — Progress Notes (Signed)
Physical Therapy Treatment Patient Details Name: RADONNA BRACHER MRN: [id number] DOB: Jul 29, 1960 Today's Date: 01/17/2015    History of Present Illness Pt s/p PLIF 9/13. Pt with h/o cervical ca, previous back surgery 10 yrs ago, and fibromyalgia    PT Comments    Patient progressing well with mobility this session and able to practice stairs.  However, concern that she may try to do everything for herself and her mom when she gets home due to no family able to come and help out.  Changing d/c recommendations to HHPT and aide to provide increased assist for ensuring safety and following precautions at home after d/c.  Follow Up Recommendations  Home health PT;Other (comment) [facility] aide)     Equipment Recommendations  Rolling walker with 5" wheels    Recommendations for Other Services       Precautions / Restrictions Precautions Precautions: Back Precaution Comments: pt able to state BAT precautions Required Braces or Orthoses: Spinal Brace Spinal Brace: Lumbar corset;Other (comment) Restrictions Weight Bearing Restrictions: No    Mobility  Bed Mobility Overal bed mobility: Needs Assistance Bed Mobility: Rolling;Sidelying to Sit;Sit to Sidelying Rolling: Supervision Sidelying to sit: Supervision     Sit to sidelying: Min guard General bed mobility comments: with rail and guiding feet in bed, reviewed sleeping positions for comfort and elevated foot of bed  Transfers Overall transfer level: Needs assistance Equipment used: Rolling walker (2 wheeled) Transfers: Sit to/from Stand Sit to Stand: Supervision         General transfer comment: from chair and toilet supervision to mod I varying techniques, but maintaining precautions  Ambulation/Gait Ambulation/Gait assistance: Supervision Ambulation Distance (Feet): 250 Feet Assistive device: Rolling walker (2 wheeled) Gait Pattern/deviations: Step-through pattern;Decreased stride length     General Gait Details:  minimal support on walker   Stairs Stairs: Yes Stairs assistance: Supervision Stair Management: Step to pattern;Sideways;One rail Left Number of Stairs: 2 General stair comments: cues for technique  Wheelchair Mobility    Modified Rankin (Stroke Patients Only)       Balance Overall balance assessment: Needs assistance         Standing balance support: Bilateral upper extremity supported;During functional activity Standing balance-Leahy Scale: Good                      Cognition Arousal/Alertness: Awake/alert Behavior During Therapy: WFL for tasks assessed/performed Overall Cognitive Status: Within Functional Limits for tasks assessed                      Exercises      General Comments General comments (skin integrity, edema, etc.): Patient voiced some concern as she is used to caring for her mom at home.  Her mom now is wanting to help her but pt concerned she will hurt herself trying to assist so has told her she doesn't need her help.  Sister in law was supposed to come assist, but pt's brother became ill and entered hospital recently.  States has some friends that have offered to help some, and her brother lives next door, but she seems reluctant to accept or even ask for help stating she will take an [place] home from the hospital.  Encouraged her to use resources, ask for help, but she continues to state there is no one.      Pertinent Vitals/Pain Pain Assessment: 0-10 Pain Score: 3  Pain Location: back Pain Descriptors / Indicators: Aching Pain Intervention(s): Monitored during session;Premedicated before  session;Repositioned    Home Living                      Prior Function            PT Goals (current goals can now be found in the care plan section) Acute Rehab PT Goals Patient Stated Goal: home Progress towards PT goals: Progressing toward goals    Frequency  Min 5X/week    PT Plan Discharge plan needs to be updated     Co-evaluation             End of Session Equipment Utilized During Treatment: Back brace Activity Tolerance: Patient tolerated treatment well Patient left: in bed;with call bell/phone within reach     Time: 1228-1258 PT Time Calculation (min) (ACUTE ONLY): 30 min  Charges:  $Gait Training: 8-22 mins $Therapeutic Activity: 8-22 mins                    G Codes:      Seena Ritacco,CYNDI 2015/02/06, 2:12 PM  [place], [place] 06-Feb-2015

## 2015-01-17 NOTE — Progress Notes (Signed)
Occupational Therapy Treatment Patient Details Name: Sabrina [name] MRN: [id number] DOB: 10-18-1960 Today's Date: 01/17/2015    History of present illness Pt s/p PLIF 9/13. Pt with h/o cervical ca, previous back surgery 10 yrs ago, and fibromyalgia   OT comments  Pt progressing towards acute OT goals. Focus of session was AE for LB ADLs and practicing toilet transfer. Session details below. D/c plan remains appropriate.  Follow Up Recommendations  Supervision/Assistance - 24 hour;No OT follow up    Equipment Recommendations  3 in 1 bedside comode    Recommendations for Other Services      Precautions / Restrictions Precautions Precautions: Back Precaution Comments: pt able to state BAT precautions Required Braces or Orthoses: Spinal Brace Spinal Brace: Lumbar corset;Other (comment) Restrictions Weight Bearing Restrictions: No       Mobility Bed Mobility Overal bed mobility: Needs Assistance Bed Mobility: Rolling;Sidelying to Sit;Sit to Sidelying Rolling: Supervision Sidelying to sit: Supervision     Sit to sidelying: Supervision General bed mobility comments: extra time, supervision for safety; used bed rails  Transfers Overall transfer level: Needs assistance Equipment used: Rolling walker (2 wheeled) Transfers: Sit to/from Stand Sit to Stand: Supervision              Balance Overall balance assessment: Needs assistance         Standing balance support: Bilateral upper extremity supported;During functional activity Standing balance-Leahy Scale: Good                     ADL Overall ADL's : Needs assistance/impaired                         Toilet Transfer: Supervision/safety;Ambulation;RW;Comfort height toilet   Toileting- Clothing Manipulation and Hygiene: Supervision/safety;Sitting/lateral lean Toileting - Clothing Manipulation Details (indicate cue type and reason): completed anterior pericare in sitting position      Functional mobility during ADLs: Min guard;Rolling walker General ADL Comments: Pt completed toilet transfer and toilet hygenie as detailed above; bed mobility as detailed below. Demonstrated AE for LB dressing techniques with pt declining practice due to increased pain following recent session with PT. Discussed adaptations for home to promote adherance to BAT precautions.       Vision                     Perception     Praxis      Cognition   Behavior During Therapy: WFL for tasks assessed/performed Overall Cognitive Status: Within Functional Limits for tasks assessed                       Extremity/Trunk Assessment               Exercises     Shoulder Instructions       General Comments      Pertinent Vitals/ Pain       Pain Assessment: 0-10 Pain Score: 5  Pain Location: back Pain Descriptors / Indicators: Constant Pain Intervention(s): Limited activity within patient's tolerance;Monitored during session;Repositioned  Home Living                                          Prior Functioning/Environment              Frequency Min 2X/week     Progress Toward Goals  OT Goals(current goals can now be found in the care plan section)  Progress towards OT goals: Progressing toward goals  Acute Rehab OT Goals Patient Stated Goal: home OT Goal Formulation: With patient Time For Goal Achievement: 01/23/15 Potential to Achieve Goals: Good ADL Goals Pt Will Perform Grooming: with modified independence;with adaptive equipment;standing Pt Will Perform Lower Body Bathing: with modified independence;with adaptive equipment;sit to/from stand Pt Will Perform Lower Body Dressing: with modified independence;with adaptive equipment;sit to/from stand Pt Will Transfer to Toilet: with modified independence;ambulating Pt Will Perform Toileting - Clothing Manipulation and hygiene: with modified independence;with adaptive equipment;sit  to/from stand;sitting/lateral leans Pt Will Perform Tub/Shower Transfer: Shower transfer;with modified independence;ambulating;shower seat;rolling walker  Plan Discharge plan remains appropriate    Co-evaluation                 End of Session Equipment Utilized During Treatment: Rolling walker;Back brace   Activity Tolerance Patient tolerated treatment well   Patient Left with call bell/phone within reach;in bed;with bed alarm set;with SCD's reapplied   Nurse Communication          Time: 6659-9357 OT Time Calculation (min): 21 min  Charges: OT General Charges $OT Visit: 1 Procedure OT Treatments $Self Care/Home Management : 8-22 mins  Hortencia Pilar 01/17/2015, 2:03 PM

## 2015-01-18 MED ORDER — OXYCODONE-ACETAMINOPHEN 5-325 MG PO TABS: 1.0000 | ORAL_TABLET | ORAL | Status: DC | PRN

## 2015-01-18 MED ORDER — METHOCARBAMOL 500 MG PO TABS: 500.0000 mg | ORAL_TABLET | Freq: Four times a day (QID) | ORAL | Status: DC | PRN

## 2015-01-18 MED ORDER — DIPHENHYDRAMINE HCL 25 MG PO CAPS: 25.0000 mg | ORAL_CAPSULE | ORAL | Status: AC | PRN

## 2015-01-18 MED ORDER — DIPHENHYDRAMINE HCL 25 MG PO CAPS
25.0000 mg | ORAL_CAPSULE | ORAL | Status: DC | PRN
  Administered 2015-01-18 – 2015-01-19 (×5): 25 mg via ORAL
  Filled 2015-01-18 (×5): qty 1

## 2015-01-18 NOTE — Care Management Note (Signed)
Case Management Note  Patient Details  Name: Sabrina [name] MRN: [id number] Date of Birth: 01-24-1961  Subjective/Objective:                    Action/Plan: Pt being discharged home with home health PT and DME: 3 in 1 and rolling walker.  CM met with the patient at the bedside and gave her home health list. Pt selected [facility]. Miranda with [facility] notified and referral accepted. Tiffany with [facility] notified of 3 in 1 and rolling walker. Bedside RN updated.  Expected Discharge Date:   (pending)               Expected Discharge Plan:  [place]  In-House Referral:     Discharge planning Services  CM Consult  Post Acute Care Choice:    Choice offered to:  Patient  DME Arranged:  3-N-1, Walker rolling DME Agency:  [place]:  PT [place]:  [place]  Status of Service:  Completed, signed off  Medicare Important Message Given:    Date Medicare IM Given:    Medicare IM give by:    Date Additional Medicare IM Given:    Additional Medicare Important Message give by:     If discussed at [place] of Stay Meetings, dates discussed:    Additional Comments:  Ollen Gross, RN 01/18/2015, 4:11 PM

## 2015-01-18 NOTE — Progress Notes (Signed)
Physical Therapy Treatment Patient Details Name: Sabrina [name] MRN: [id number] DOB: [birth year] Today's Date: 01/18/2015    History of Present Illness Pt s/p PLIF 9/13. Pt with h/o cervical ca, previous back surgery 10 yrs ago, and fibromyalgia    PT Comments    Patient progressing well and close to goal status.  Discussed car transfers.  Encouraged continued reaching out for help with her mom.  She will have RN page if further needs present themselves prior to d/c.  Follow Up Recommendations  Home health PT     Equipment Recommendations  Rolling walker with 5" wheels    Recommendations for Other Services       Precautions / Restrictions Precautions Precautions: Back Required Braces or Orthoses: Spinal Brace Spinal Brace: Lumbar corset;Other (comment)    Mobility  Bed Mobility   Bed Mobility: Sit to Sidelying Rolling: Modified independent (Device/Increase time) Sidelying to sit: Modified independent (Device/Increase time)       General bed mobility comments: demonstrated good technique without rail  Transfers Overall transfer level: Needs assistance Equipment used: Rolling walker (2 wheeled) Transfers: Sit to/from Stand Sit to Stand: Supervision         General transfer comment: assist for safety  Ambulation/Gait Ambulation/Gait assistance: Supervision Ambulation Distance (Feet): 250 Feet Assistive device: Rolling walker (2 wheeled) Gait Pattern/deviations: Step-through pattern;Decreased stride length     General Gait Details: moves appropriately and safely for    Stairs Stairs: Yes Stairs assistance: Supervision Stair Management: Sideways;Step to pattern;One rail Left Number of Stairs: 2 General stair comments: safe technique  Wheelchair Mobility    Modified Rankin (Stroke Patients Only)       Balance Overall balance assessment: Needs assistance           Standing balance-Leahy Scale: Good                      Cognition  Arousal/Alertness: Awake/alert Behavior During Therapy: WFL for tasks assessed/performed Overall Cognitive Status: Within Functional Limits for tasks assessed                      Exercises      General Comments General comments (skin integrity, edema, etc.): Continued to re-inforce she cannot physically help her mother at home.  Encouraged to call sibling if that happens.  Patient continued to realy he would not help.      Pertinent Vitals/Pain Pain Score: 6  Pain Location: surgical site Pain Descriptors / Indicators: Aching Pain Intervention(s): Monitored during session;Repositioned    Home Living                      Prior Function            PT Goals (current goals can now be found in the care plan section) Progress towards PT goals: Progressing toward goals    Frequency  Min 6X/week    PT Plan Current plan remains appropriate    Co-evaluation             End of Session Equipment Utilized During Treatment: Back brace Activity Tolerance: Patient tolerated treatment well Patient left: in bed;with call bell/phone within reach     Time: 1450-1514 PT Time Calculation (min) (ACUTE ONLY): 24 min  Charges:  $Gait Training: 8-22 mins $Therapeutic Activity: 8-22 mins                    G Codes:  WYNN,CYNDI 01/18/2015, 3:35 PM  Magda Kiel, [place] 01/18/2015

## 2015-01-18 NOTE — Discharge Summary (Signed)
Physician Discharge Summary  Patient ID: Sabrina [name] MRN: [id number] DOB/AGE: 1960-12-13 54 y.o.  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 rate 18, height 5\' 3"  (1.6 m), weight 106.142 kg (234 lb), SpO2 98 %. incision is clean and dry , gait intact motor function nl  Disposition:discharge home  Discharge Instructions    Call MD for:  redness, tenderness, or signs of infection (pain, swelling, redness, odor or green/yellow discharge around incision site)    Complete by:  As directed      Call MD for:  severe uncontrolled pain    Complete by:  As directed      Call MD for:  temperature >100.4    Complete by:  As directed      Diet - low sodium heart healthy    Complete by:  As directed      Discharge instructions    Complete by:  As directed   Okay to shower. Do not apply salves or appointments to incision. No heavy lifting with the upper extremities greater than 15 pounds. May resume driving when not requiring pain medication and patient feels comfortable with doing so.     Increase activity slowly    Complete by:  As directed             Medication List    TAKE these medications        celecoxib 200 MG capsule  Commonly known as:  CELEBREX  TAKE 1 CAPSULE (200 MG TOTAL) BY MOUTH DAILY.     clonazePAM 1 MG tablet  Commonly known as:  KLONOPIN  Take 1 tablet (1 mg total) by mouth 2 (two) times daily.     diphenhydrAMINE 25  mg capsule  Commonly known as:  BENADRYL  Take 1 capsule (25 mg total) by mouth every 4 (four) hours as needed for itching.     fluticasone 50 MCG/ACT nasal spray  Commonly known as:  FLONASE  Place 2 sprays into both nostrils at bedtime.     furosemide 40 MG tablet  Commonly known as:  LASIX  1 tab po qd prn swelling of extremities (take this no more than 2 days per week)     gabapentin 300 MG capsule  Commonly known as:  NEURONTIN  Take 3 capsules three times a day     losartan-hydrochlorothiazide 100-25 MG per tablet  Commonly known as:  HYZAAR  TAKE 1 TABLET BY MOUTH DAILY.     methocarbamol 500 MG tablet  Commonly known as:  ROBAXIN  Take 250-500 mg by mouth 2 (two) times daily.     methocarbamol 500 MG tablet  Commonly known as:  ROBAXIN  Take 1 tablet (500 mg total) by mouth every 6 (six) hours as needed for muscle spasms.     nortriptyline 25 MG capsule  Commonly known as:  PAMELOR  Take 25 mg by mouth. Take two capsules at bedtime     omeprazole 40 MG capsule  Commonly known as:  PRILOSEC  Take 1 capsule (40 mg total) by mouth 2 (  two) times daily.     oxybutynin 10 MG 24 hr tablet  Commonly known as:  DITROPAN-XL  Take 1 tablet (10 mg total) by mouth at bedtime.     Oxycodone HCl 10 MG Tabs  1-2 tabs po q6h prn pain     oxyCODONE-acetaminophen 5-325 MG per tablet  Commonly known as:  PERCOCET/ROXICET  Take 1-2 tablets by mouth every 4 (four) hours as needed for moderate pain.     sennosides-docusate sodium 8.6-50 MG tablet  Commonly known as:  SENOKOT-S  Take 2 tablets by mouth at bedtime.     sertraline 50 MG tablet  Commonly known as:  ZOLOFT  Take 50 mg by mouth daily.         SignedEarleen Newport 01/18/2015, 3:56 PM

## 2015-01-18 NOTE — Progress Notes (Signed)
Patient ID: Sabrina [name], female   DOB: 1961/04/15, 54 y.o.   MRN: [id number] Vital signs are stable Has had itching with pain meds Requesting benadryl Incision clean and dry, dressing removed  No BM yet will receive laxative this pm Plan discharge tomorrow.

## 2015-01-19 MED ORDER — DIPHENHYDRAMINE HCL 50 MG/ML IJ SOLN: 12.5000 mg | Freq: Once | INTRAMUSCULAR | Status: DC

## 2015-01-19 NOTE — Progress Notes (Signed)
Discharge orders received. Pt educated on discharge instructions and spinal precautions. Pt given discharge packet and prescriptions. IV removed. 3in1 and rolling walker delivered to pt's room. Pt and belongings taken downstairs by staff via wheelchair.

## 2015-03-20 ENCOUNTER — Encounter: Payer: Self-pay | Admitting: Family Medicine

## 2015-04-22 ENCOUNTER — Other Ambulatory Visit: Payer: Self-pay | Admitting: *Deleted

## 2015-04-22 MED ORDER — OMEPRAZOLE 40 MG PO CPDR: 40.0000 mg | DELAYED_RELEASE_CAPSULE | Freq: Two times a day (BID) | ORAL | Status: DC

## 2015-04-22 MED ORDER — OXYBUTYNIN CHLORIDE ER 10 MG PO TB24: 10.0000 mg | ORAL_TABLET | Freq: Every day | ORAL | Status: DC

## 2015-04-22 NOTE — Telephone Encounter (Signed)
RF request for oxybutynin LOV: 12/04/14 Next ov: None Last written: 10/02/14 #30 w/ 11RF

## 2015-04-22 NOTE — Telephone Encounter (Signed)
RF request for omeprazole LOV: 12/04/14 Next ov:  None Last written: 12/06/14 #60 w/ 3RF

## 2015-05-31 ENCOUNTER — Telehealth: Payer: Self-pay | Admitting: Family Medicine

## 2015-05-31 NOTE — Telephone Encounter (Signed)
Patient states she stuck herself with a rusty needle and she wants to know if she should get a Tdap booster?  I didn't see any documentation in her chart of having a TDAP at this office and she couldn't recall if she had one at any of her previous doctors.  Please advise.

## 2015-06-01 NOTE — Telephone Encounter (Signed)
Nurse visit for Tdap

## 2015-06-03 ENCOUNTER — Ambulatory Visit (INDEPENDENT_AMBULATORY_CARE_PROVIDER_SITE_OTHER): Payer: BC Managed Care – PPO | Admitting: Family Medicine

## 2015-06-03 DIAGNOSIS — Z23 Encounter for immunization: Secondary | ICD-10-CM

## 2015-06-03 NOTE — Telephone Encounter (Signed)
LMOM for pt to CB.  

## 2015-06-03 NOTE — Telephone Encounter (Signed)
Patient aware.  She scheduled nurse appointment today at [facility].

## 2015-06-16 ENCOUNTER — Other Ambulatory Visit: Payer: Self-pay | Admitting: Neurology

## 2015-06-19 ENCOUNTER — Other Ambulatory Visit: Payer: Self-pay | Admitting: *Deleted

## 2015-06-19 MED ORDER — METHOCARBAMOL 500 MG PO TABS: 250.0000 mg | ORAL_TABLET | Freq: Two times a day (BID) | ORAL | Status: DC

## 2015-06-19 NOTE — Telephone Encounter (Signed)
RF request for methocarbamol LOV: 12/04/14 Next ov: None Last written: 01/18/15 #60 w/ 3RF   Please advise. Thanks.

## 2015-07-17 ENCOUNTER — Telehealth: Payer: Self-pay | Admitting: Neurology

## 2015-07-17 DIAGNOSIS — M792 Neuralgia and neuritis, unspecified: Secondary | ICD-10-CM

## 2015-07-17 NOTE — Telephone Encounter (Signed)
PT called in regards to her medication gabapentin/Dawn CB# [phone]

## 2015-07-18 MED ORDER — GABAPENTIN 300 MG PO CAPS: 900.0000 mg | ORAL_CAPSULE | Freq: Three times a day (TID) | ORAL | Status: DC

## 2015-07-18 NOTE — Telephone Encounter (Signed)
I spoke with patient. She was out of refills for her gabapentin and needed refill to be sent to her pharmacy. Patient due for an ov. Scheduled f/u for 4/13 @ 1:30.

## 2015-07-19 ENCOUNTER — Other Ambulatory Visit: Payer: Self-pay | Admitting: *Deleted

## 2015-07-19 MED ORDER — CLONAZEPAM 1 MG PO TABS: 1.0000 mg | ORAL_TABLET | Freq: Two times a day (BID) | ORAL | Status: DC

## 2015-07-19 NOTE — Telephone Encounter (Signed)
Rx faxed

## 2015-07-19 NOTE — Telephone Encounter (Signed)
RF request for clonazepam LOV: 12/04/14 Next ov: None Last written: 12/04/14 #60 w/ 5RF  Please advise. Thanks.

## 2015-08-15 ENCOUNTER — Ambulatory Visit (INDEPENDENT_AMBULATORY_CARE_PROVIDER_SITE_OTHER): Payer: BC Managed Care – PPO | Admitting: Neurology

## 2015-08-15 ENCOUNTER — Encounter: Payer: Self-pay | Admitting: Neurology

## 2015-08-15 VITALS — BP 128/70 | HR 76 | Ht 63.5 in | Wt 244.0 lb

## 2015-08-15 DIAGNOSIS — M792 Neuralgia and neuritis, unspecified: Secondary | ICD-10-CM

## 2015-08-15 MED ORDER — GABAPENTIN 300 MG PO CAPS: 900.0000 mg | ORAL_CAPSULE | Freq: Three times a day (TID) | ORAL | Status: DC

## 2015-08-15 MED ORDER — NORTRIPTYLINE HCL 10 MG PO CAPS: ORAL_CAPSULE | ORAL | Status: DC
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
## 2015-08-15 NOTE — Patient Instructions (Signed)
1. Continue Gabapentin 300mg : Take 3 caps three times a day 2. Start nortriptyline 10mg : take 1 capsule at night for 1 week, then increase to 2 caps at night for 1 week, then increase to 3 caps at night and continue 3. Call our office in 3 weeks to update [place] on your condition. We may adjust medication depending on how you are doing 4. Monitor for drowsiness with new medication 5. Follow-up in 5 months, call for any changes

## 2015-09-09 ENCOUNTER — Encounter: Payer: Self-pay | Admitting: Medical

## 2015-09-09 ENCOUNTER — Ambulatory Visit (INDEPENDENT_AMBULATORY_CARE_PROVIDER_SITE_OTHER): Payer: BC Managed Care – PPO | Admitting: Medical

## 2015-09-09 VITALS — BP 122/78 | HR 85 | Temp 98.3°F | Ht 63.5 in | Wt 240.4 lb

## 2015-09-09 DIAGNOSIS — J309 Allergic rhinitis, unspecified: Secondary | ICD-10-CM | POA: Diagnosis not present

## 2015-09-09 DIAGNOSIS — R05 Cough: Secondary | ICD-10-CM | POA: Diagnosis not present

## 2015-09-09 DIAGNOSIS — J0100 Acute maxillary sinusitis, unspecified: Secondary | ICD-10-CM | POA: Diagnosis not present

## 2015-09-09 DIAGNOSIS — H6983 Other specified disorders of Eustachian tube, bilateral: Secondary | ICD-10-CM | POA: Diagnosis not present

## 2015-09-09 DIAGNOSIS — R059 Cough, unspecified: Secondary | ICD-10-CM

## 2015-09-09 MED ORDER — BENZONATATE 100 MG PO CAPS
100.0000 mg | ORAL_CAPSULE | Freq: Three times a day (TID) | ORAL | Status: DC | PRN
Start: 2015-09-09 — End: 2016-01-21

## 2015-09-09 MED ORDER — METHYLPREDNISOLONE ACETATE 40 MG/ML IJ SUSP
40.0000 mg | Freq: Once | INTRAMUSCULAR | Status: AC
  Administered 2015-09-09: 40 mg via INTRAMUSCULAR

## 2015-09-09 MED ORDER — AMOXICILLIN-POT CLAVULANATE 875-125 MG PO TABS: 1.0000 | ORAL_TABLET | Freq: Two times a day (BID) | ORAL | Status: DC
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
## 2015-09-09 NOTE — Patient Instructions (Addendum)
For allergies continue the flonase and zyrtec. We are giving depomedrol IM today. This may  relieve allergies and decrease ear pressure.  For cough rx benzonatate.  For sinus infection rx augmentin antibiotic.  For wax on both side could use debrox otc.  If in a week any residual ear pressure sensation would recommend irrigating the canals.  Follow up in 7 days or as needed

## 2015-09-09 NOTE — Progress Notes (Signed)
Pre visit review using our clinic review tool, if applicable. No additional management support is needed unless otherwise documented below in the visit note. 

## 2015-09-16 ENCOUNTER — Encounter: Payer: Self-pay | Admitting: Family Medicine

## 2015-09-23 ENCOUNTER — Encounter: Payer: Self-pay | Admitting: Family Medicine

## 2015-09-23 ENCOUNTER — Ambulatory Visit (INDEPENDENT_AMBULATORY_CARE_PROVIDER_SITE_OTHER): Payer: BC Managed Care – PPO | Admitting: Family Medicine

## 2015-09-23 VITALS — BP 130/85 | HR 79 | Temp 97.3°F | Resp 20 | Wt 239.8 lb

## 2015-09-23 DIAGNOSIS — J209 Acute bronchitis, unspecified: Secondary | ICD-10-CM

## 2015-09-23 MED ORDER — PREDNISONE 20 MG PO TABS: ORAL_TABLET | ORAL | Status: DC

## 2015-09-23 MED ORDER — DOXYCYCLINE HYCLATE 100 MG PO TABS: 100.0000 mg | ORAL_TABLET | Freq: Two times a day (BID) | ORAL | Status: DC
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
## 2015-09-23 NOTE — Patient Instructions (Addendum)
Try allegra instead of zyrtec.  Prednisone taper. Doxycyline for 10 days.  Rest,stay well hydrated.

## 2015-10-07 ENCOUNTER — Telehealth: Payer: Self-pay | Admitting: *Deleted

## 2015-10-07 DIAGNOSIS — J320 Chronic maxillary sinusitis: Secondary | ICD-10-CM

## 2015-10-07 NOTE — Telephone Encounter (Signed)
Patient called left message stating she is no better after 2 rounds of antibiotics,steroid injection and prednisone taper. Still having sinus pressure ,ear pressure and hoarseness. She wants to know what else to do . Last seen by Dr Raoul Pitch on 09/23/15 (see note). Please advise

## 2015-10-07 NOTE — Telephone Encounter (Signed)
I would recommend pt be seen by her ENT for further evaluation. Please ask her her prior ENT's name, and we can refer her back to them if she desires without an appt here.  If she is feverish/chills then would also want to see her and perform labs, consider additional treatment and place referral.  Please advise.

## 2015-10-08 DIAGNOSIS — J329 Chronic sinusitis, unspecified: Secondary | ICD-10-CM | POA: Insufficient documentation

## 2015-10-08 NOTE — Telephone Encounter (Signed)
SW patient, she would like to get referral to ENT. She has been seen at Dr. Thornell Mule practice in the past.

## 2015-12-09 ENCOUNTER — Other Ambulatory Visit: Payer: Self-pay | Admitting: Family Medicine

## 2015-12-09 MED ORDER — LOSARTAN POTASSIUM-HCTZ 100-25 MG PO TABS
ORAL_TABLET | ORAL | 3 refills | Status: DC
Start: 2015-12-09 — End: 2016-04-18

## 2015-12-10 ENCOUNTER — Other Ambulatory Visit: Payer: Self-pay | Admitting: *Deleted

## 2015-12-10 MED ORDER — METHOCARBAMOL 500 MG PO TABS: 500.0000 mg | ORAL_TABLET | Freq: Four times a day (QID) | ORAL | 3 refills | Status: DC | PRN

## 2015-12-10 NOTE — Telephone Encounter (Signed)
[facility]  RF request for methocarbalmol LOV: 12/04/14 Next ov: None Last written: 06/19/15 #60 w/ 3RF  Please advise. Thanks.

## 2015-12-11 NOTE — Telephone Encounter (Signed)
Faxed Rx to pharmacy  

## 2016-01-17 ENCOUNTER — Ambulatory Visit (INDEPENDENT_AMBULATORY_CARE_PROVIDER_SITE_OTHER): Payer: BC Managed Care – PPO | Admitting: Neurology

## 2016-01-17 ENCOUNTER — Encounter: Payer: Self-pay | Admitting: Neurology

## 2016-01-17 VITALS — BP 124/78 | HR 77 | Temp 98.3°F | Ht 63.5 in | Wt 234.4 lb

## 2016-01-17 DIAGNOSIS — M792 Neuralgia and neuritis, unspecified: Secondary | ICD-10-CM | POA: Diagnosis not present

## 2016-01-17 MED ORDER — LIDOCAINE 5 % EX OINT: 1.0000 "application " | TOPICAL_OINTMENT | Freq: Three times a day (TID) | CUTANEOUS | 4 refills | Status: DC | PRN

## 2016-01-17 MED ORDER — ALPHA-LIPOIC ACID 600 MG PO CAPS: ORAL_CAPSULE | ORAL | 11 refills | Status: DC

## 2016-01-17 NOTE — Patient Instructions (Addendum)
1. Start Lidocaine ointment 5%, apply three times a day 2. Start alpha-lipoic acid 600mg  once a day 3. Call our office in 2 weeks if no effect 4. Recommend seeing a podiatrist as well 5. Follow-up in 3 months, call for any changes
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
## 2016-01-19 ENCOUNTER — Encounter: Payer: Self-pay | Admitting: Family Medicine

## 2016-01-21 ENCOUNTER — Encounter: Payer: Self-pay | Admitting: Family Medicine

## 2016-01-21 ENCOUNTER — Ambulatory Visit (INDEPENDENT_AMBULATORY_CARE_PROVIDER_SITE_OTHER): Payer: BC Managed Care – PPO | Admitting: Family Medicine

## 2016-01-21 VITALS — BP 134/83 | HR 74 | Temp 98.2°F | Resp 20 | Wt 237.5 lb

## 2016-01-21 DIAGNOSIS — R350 Frequency of micturition: Secondary | ICD-10-CM | POA: Diagnosis not present

## 2016-01-21 LAB — POC URINALSYSI DIPSTICK (AUTOMATED)
Bilirubin, UA: NEGATIVE
Glucose, UA: NEGATIVE
KETONES UA: NEGATIVE
Leukocytes, UA: NEGATIVE
Nitrite, UA: NEGATIVE
PH UA: 5.5
PROTEIN UA: NEGATIVE
RBC UA: NEGATIVE
SPEC GRAV UA: 1.02
UROBILINOGEN UA: 0.2

## 2016-01-21 NOTE — Patient Instructions (Signed)
I will call you with the results of your urine culture once available.  Watch the common bladder irritants in your diet. Take the ditropan as prescribed for urinary urgency. If symptoms worsen, you become feverish, nausea or vomit please be seen immediately.     Interstitial Cystitis Interstitial cystitis is a condition that causes inflammation of the bladder. The bladder is a hollow organ in the lower part of your abdomen. It stores urine after the urine is made by your kidneys. With interstitial cystitis, you may have pain in the bladder area. You may also have a frequent and urgent need to urinate. The severity of interstitial cystitis can vary from person to person. You may have flare-ups of the condition, and then it may go away for a while. For many people who have this condition, it becomes a long-term problem. CAUSES The cause of this condition is not known. RISK FACTORS This condition is more likely to develop in women. SYMPTOMS Symptoms of interstitial cystitis vary, and they can change over time. Symptoms may include:  Discomfort or pain in the bladder area. This can range from mild to severe. The pain may change in intensity as the bladder fills with urine or as it empties.  Pelvic pain.  An urgent need to urinate.  Frequent urination.  Pain during sexual intercourse.  Pinpoint bleeding on the bladder wall. For women, the symptoms often get worse during menstruation. DIAGNOSIS This condition is diagnosed by evaluating your symptoms and ruling out other causes. A physical exam will be done. Various tests may be done to rule out other conditions. Common tests include:  Urine tests.  Cystoscopy. In this test, a tool that is like a very thin telescope is used to look into your bladder.  Biopsy. This involves taking a sample of tissue from the bladder wall to be examined under a microscope. TREATMENT There is no cure for interstitial cystitis, but treatment methods are  available to control your symptoms. Work closely with your health care provider to find the treatments that will be most effective for you. Treatment options may include:  Medicines to relieve pain and to help reduce the number of times that you feel the need to urinate.  Bladder training. This involves learning ways to control when you urinate, such as:  Urinating at scheduled times.  Training yourself to delay urination.  Doing exercises (Kegel exercises) to strengthen the muscles that control urine flow.  Lifestyle changes, such as changing your diet or taking steps to control stress.  Use of a device that provides electrical stimulation in order to reduce pain.  A procedure that stretches your bladder by filling it with air or fluid.  Surgery. This is rare. It is only done for extreme cases if other treatments do not help. HOME CARE INSTRUCTIONS  Take medicines only as directed by your health care provider.  Use bladder training techniques as directed.  Keep a bladder diary to find out which foods, liquids, or activities make your symptoms worse.  Use your bladder diary to schedule bathroom trips. If you are away from home, plan to be near a bathroom at each of your scheduled times.  Make sure you urinate just before you leave the house and just before you go to bed.  Do Kegel exercises as directed by your health care provider.  Do not drink alcohol.  Do not use any tobacco products, including cigarettes, chewing tobacco, or electronic cigarettes. If you need help quitting, ask your health care  provider.  Make dietary changes as directed by your health care provider. You may need to avoid spicy foods and foods that contain a high amount of potassium.  Limit your drinking of beverages that stimulate urination. These include soda, coffee, and tea.  Keep all follow-up visits as directed by your health care provider. This is important. SEEK MEDICAL CARE IF:  Your symptoms  do not get better after treatment.  Your pain and discomfort are getting worse.  You have more frequent urges to urinate.  You have a fever. SEEK IMMEDIATE MEDICAL CARE IF:  You are not able to control your bladder at all.   This information is not intended to replace advice given to you by your health care provider. Make sure you discuss any questions you have with your health care provider.   Document Released: 12/20/2003 Document Revised: 05/11/2014 Document Reviewed: 12/26/2013 Elsevier Interactive Patient Education [employer].
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
## 2016-01-22 LAB — URINE CULTURE

## 2016-01-23 ENCOUNTER — Telehealth: Payer: Self-pay | Admitting: Family Medicine

## 2016-01-23 NOTE — Telephone Encounter (Signed)
Please call pt - urine culture is negative. No infection causes of her discomfort,

## 2016-01-24 NOTE — Telephone Encounter (Signed)
Left message on patient voice mail normal culture

## 2016-02-10 ENCOUNTER — Other Ambulatory Visit: Payer: Self-pay

## 2016-02-10 MED ORDER — CLONAZEPAM 1 MG PO TABS: 1.0000 mg | ORAL_TABLET | Freq: Two times a day (BID) | ORAL | 5 refills | Status: DC

## 2016-02-10 NOTE — Telephone Encounter (Signed)
Pharmacy sent refill request for Clonazepam.

## 2016-02-11 NOTE — Telephone Encounter (Signed)
Rx faxed to [facility] [place] 

## 2016-02-28 IMAGING — CR DG CHEST 2V
2 series · 2 of 2 positions shown · non-contrast
Comparison: Chest x-ray of 05/01/2011

CLINICAL DATA: Extreme shortness of breath, chest pain

EXAM:
CHEST  2 VIEW

[view not recorded (1 of 2)]
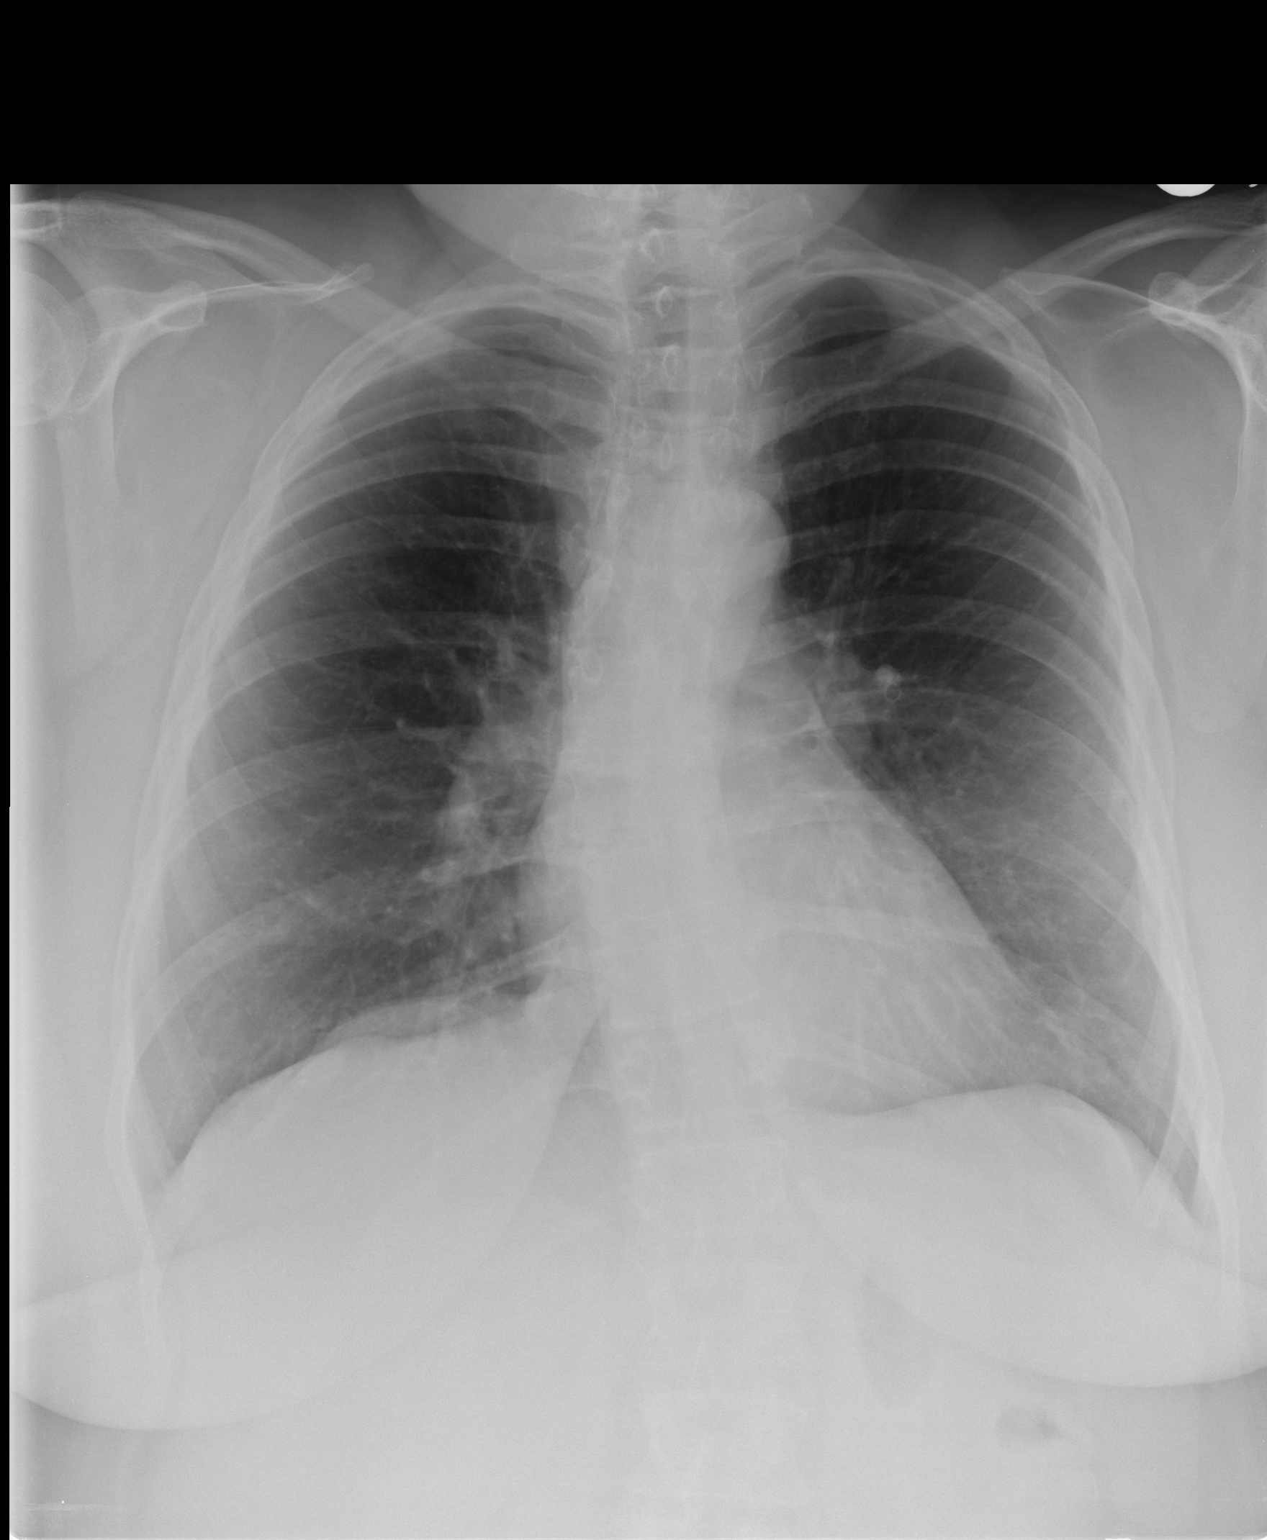

[view not recorded (2 of 2)]
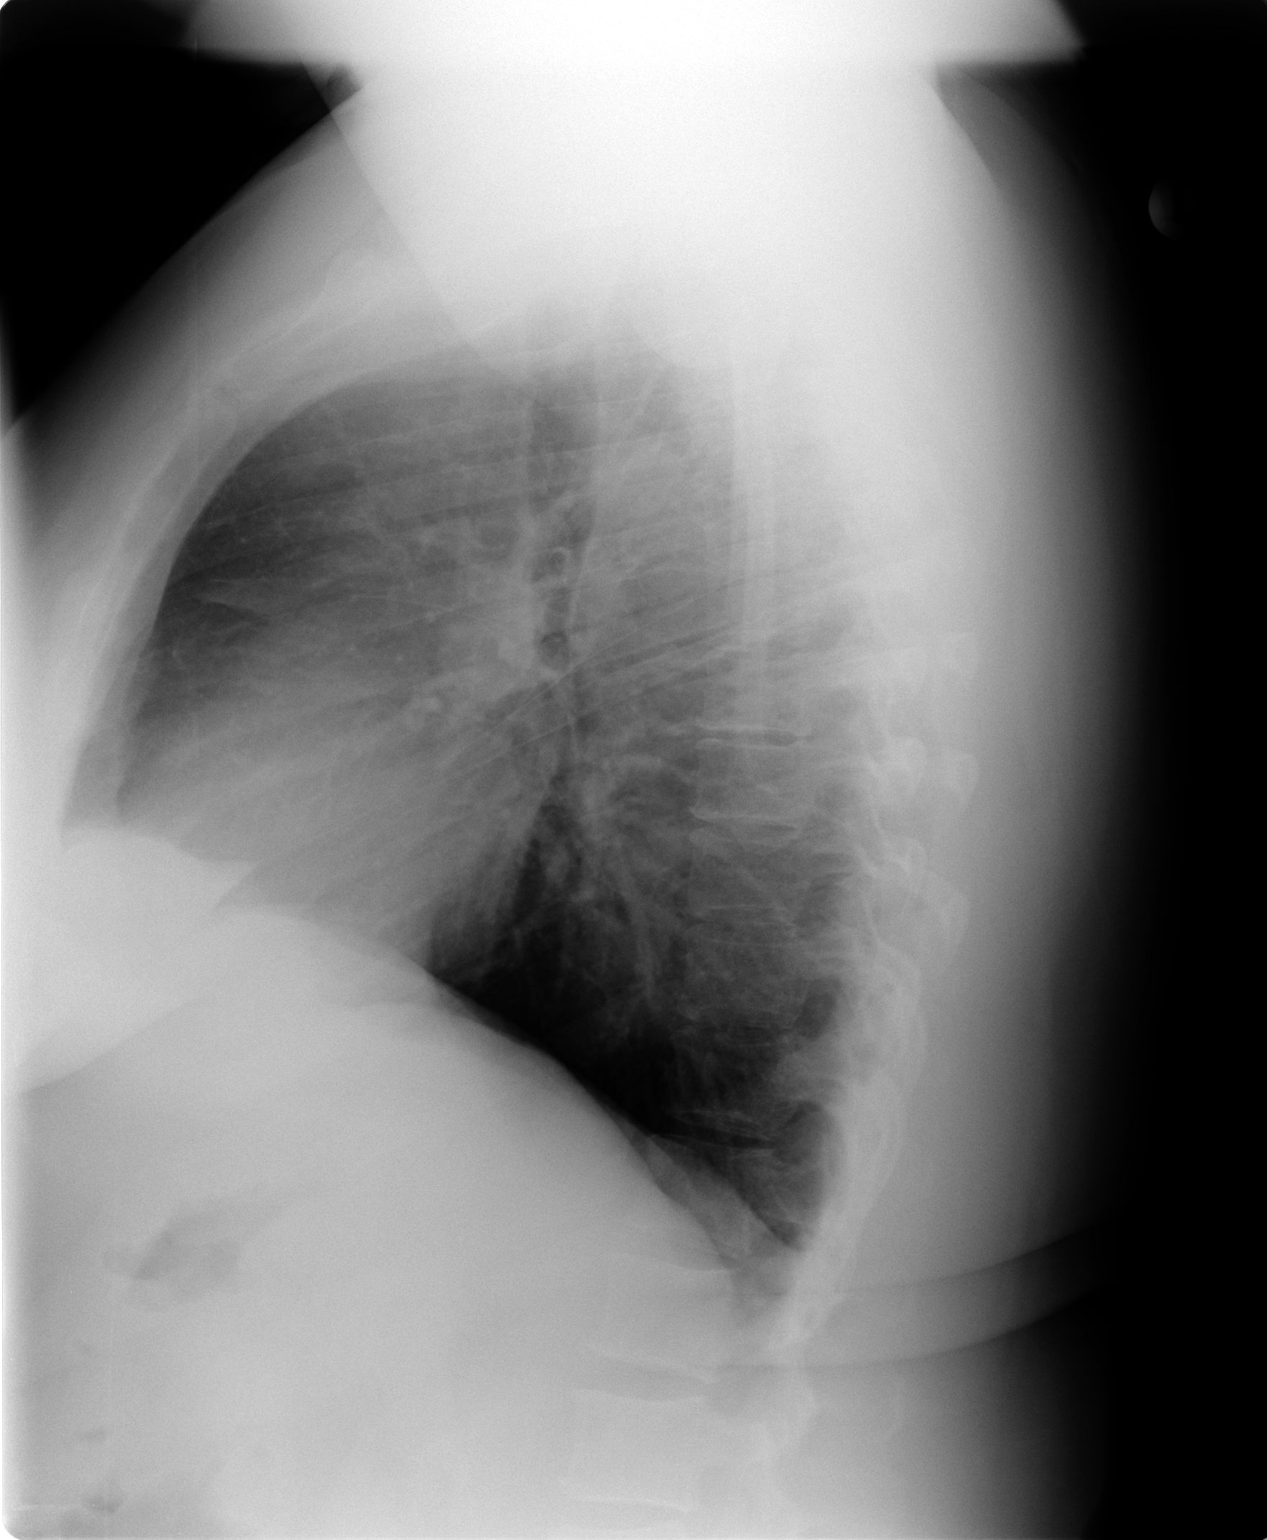

[2 of 2 positions shown; findings below may reference images not displayed]

FINDINGS: No active infiltrate or effusion is seen. Mediastinal and hilar
contours are unremarkable. The heart is within upper limits of
normal. Curvature of the thoracolumbar spine is stable.
IMPRESSION: No active cardiopulmonary disease.

## 2016-03-29 ENCOUNTER — Ambulatory Visit: Payer: Self-pay | Admitting: Orthopedic Surgery

## 2016-04-14 ENCOUNTER — Encounter (HOSPITAL_COMMUNITY)
Admission: RE | Admit: 2016-04-14 | Discharge: 2016-04-14 | Disposition: A | Discharge status: Home / Self Care | Payer: BC Managed Care – PPO | Source: Ambulatory Visit | Attending: Orthopedic Surgery | Admitting: Orthopedic Surgery

## 2016-04-14 ENCOUNTER — Encounter (HOSPITAL_COMMUNITY): Payer: Self-pay

## 2016-04-14 DIAGNOSIS — Z01812 Encounter for preprocedural laboratory examination: Secondary | ICD-10-CM | POA: Insufficient documentation

## 2016-04-14 DIAGNOSIS — Z01810 Encounter for preprocedural cardiovascular examination: Secondary | ICD-10-CM | POA: Diagnosis present

## 2016-04-14 HISTORY — DX: Unspecified osteoarthritis, unspecified site: M19.90

## 2016-04-14 LAB — CBC
HEMATOCRIT: 36.9 % (ref 36.0–46.0)
HEMOGLOBIN: 11.8 g/dL — AB (ref 12.0–15.0)
MCH: 24.3 pg — ABNORMAL LOW (ref 26.0–34.0)
MCHC: 32 g/dL (ref 30.0–36.0)
MCV: 75.9 fL — AB (ref 78.0–100.0)
Platelets: 304 10*3/uL (ref 150–400)
RBC: 4.86 MIL/uL (ref 3.87–5.11)
RDW: 15.5 % (ref 11.5–15.5)
WBC: 7.6 10*3/uL (ref 4.0–10.5)

## 2016-04-14 LAB — BASIC METABOLIC PANEL
ANION GAP: 10 (ref 5–15)
BUN: 16 mg/dL (ref 6–20)
CHLORIDE: 106 mmol/L (ref 101–111)
CO2: 22 mmol/L (ref 22–32)
Calcium: 9.1 mg/dL (ref 8.9–10.3)
Creatinine, Ser: 0.78 mg/dL (ref 0.44–1.00)
GFR calc non Af Amer: >60 mL/min (ref >60)
GLUCOSE: 96 mg/dL (ref 65–99)
Potassium: 4.2 mmol/L (ref 3.5–5.1)
Sodium: 138 mmol/L (ref 135–145)

## 2016-04-14 NOTE — Pre-Procedure Instructions (Signed)
Sabrina [name]  04/14/2016      [id number] [place], [place] - [address] [place] [address] Phone: [phone] Fax: [phone]    Your procedure is scheduled on   Tuesday  04/21/16  Report to [facility] Admitting at 100 P.M.  Call this number if you have problems the morning of surgery:  [phone]   Remember:  Do not eat food or drink liquids after midnight.  Take these medicines the morning of surgery with A SIP OF WATER   CLONAZEPAM, GABAPENTIN, OMEPRAZOLE, OXYCODONE    (STOP 7 DAYS PRIOR TO SURGERY ASPIRIN OR ASPIRIN PRODUCTS, IBUPROFEN/ ADVIL/ MOTRIN, GOODY POWDERS, BC'S, CELECOXIB(CELEBREX), HERBAL MEDICINES)   Do not wear jewelry, make-up or nail polish.  Do not wear lotions, powders, or perfumes, or deoderant.  Do not shave 48 hours prior to surgery.  Men may shave face and neck.  Do not bring valuables to the hospital.  [facility] is not responsible for any belongings or valuables.  Contacts, dentures or bridgework may not be worn into surgery.  Leave your suitcase in the car.  After surgery it may be brought to your room.  For patients admitted to the hospital, discharge time will be determined by your treatment team.  Patients discharged the day of surgery will not be allowed to drive home.   Name and phone number of your driver:    Special instructions:  [facility] - Preparing for Surgery  Before surgery, you can play an important role.  Because skin is not sterile, your skin needs to be as free of germs as possible.  You can reduce the number of germs on you skin by washing with CHG (chlorahexidine gluconate) soap before surgery.  CHG is an antiseptic cleaner which kills germs and bonds with the skin to continue killing germs even after washing.  Please DO NOT use if you have an allergy to CHG or antibacterial soaps.  If your skin becomes reddened/irritated stop using  the CHG and inform your nurse when you arrive at Short Stay.  Do not shave (including legs and underarms) for at least 48 hours prior to the first CHG shower.  You may shave your face.  Please follow these instructions carefully:   1.  Shower with CHG Soap the night before surgery and the                                morning of Surgery.  2.  If you choose to wash your hair, wash your hair first as usual with your       normal shampoo.  3.  After you shampoo, rinse your hair and body thoroughly to remove the                      Shampoo.  4.  Use CHG as you would any other liquid soap.  You can apply chg directly       to the skin and wash gently with scrungie or a clean washcloth.  5.  Apply the CHG Soap to your body ONLY FROM THE NECK DOWN.        Do not use on open wounds or open sores.  Avoid contact with your eyes,       ears, mouth and genitals (private parts).  Wash genitals (  private parts)       with your normal soap.  6.  Wash thoroughly, paying special attention to the area where your surgery        will be performed.  7.  Thoroughly rinse your body with warm water from the neck down.  8.  DO NOT shower/wash with your normal soap after using and rinsing off       the CHG Soap.  9.  Pat yourself dry with a clean towel.            10.  Wear clean pajamas.            11.  Place clean sheets on your bed the night of your first shower and do not        sleep with pets.  Day of Surgery  Do not apply any lotions/deoderants the morning of surgery.  Please wear clean clothes to the hospital/surgery center.    Please read over the following fact sheets that you were given. Pain Booklet

## 2016-04-18 ENCOUNTER — Other Ambulatory Visit: Payer: Self-pay | Admitting: Family Medicine

## 2016-04-20 ENCOUNTER — Other Ambulatory Visit: Payer: Self-pay | Admitting: *Deleted

## 2016-04-20 MED ORDER — METHOCARBAMOL 500 MG PO TABS: 500.0000 mg | ORAL_TABLET | Freq: Four times a day (QID) | ORAL | 3 refills | Status: DC | PRN

## 2016-04-20 NOTE — Telephone Encounter (Signed)
Fax from [facility] for methocarbamol  LOV: 12/04/14 NOV: None Last written: 12/11/15 #60 w/ 3RF

## 2016-04-20 NOTE — Telephone Encounter (Signed)
RF request for losartan/hctz LOV: 12/04/14 Next ov: None Last written: 12/09/14 #30 w/ 3RF  Rx sent for #30 w/ 0RF. Pt is due for f/u [facility], needs office visit for more refills.

## 2016-04-20 NOTE — Telephone Encounter (Signed)
Unable to fax Rx to [facility]. [facility] [address] [facility]

## 2016-04-21 ENCOUNTER — Encounter (HOSPITAL_COMMUNITY): Payer: Self-pay | Admitting: *Deleted

## 2016-04-21 ENCOUNTER — Ambulatory Visit (HOSPITAL_COMMUNITY): Payer: BC Managed Care – PPO | Admitting: Anesthesiology

## 2016-04-21 ENCOUNTER — Encounter (HOSPITAL_COMMUNITY)
Admission: RE | Disposition: A | Discharge status: Home / Self Care | Payer: Self-pay | Source: Ambulatory Visit | Attending: Orthopedic Surgery

## 2016-04-21 ENCOUNTER — Inpatient Hospital Stay (HOSPITAL_COMMUNITY)
Admission: RE | Admit: 2016-04-21 | Discharge: 2016-04-24 | DRG: 506 | Disposition: A | Discharge status: Home / Self Care | Payer: BC Managed Care – PPO | Source: Ambulatory Visit | Attending: Orthopedic Surgery | Admitting: Orthopedic Surgery

## 2016-04-21 DIAGNOSIS — M960 Pseudarthrosis after fusion or arthrodesis: Principal | ICD-10-CM | POA: Diagnosis present

## 2016-04-21 DIAGNOSIS — Z79899 Other long term (current) drug therapy: Secondary | ICD-10-CM

## 2016-04-21 DIAGNOSIS — Z886 Allergy status to analgesic agent status: Secondary | ICD-10-CM

## 2016-04-21 DIAGNOSIS — Z6841 Body Mass Index (BMI) 40.0 and over, adult: Secondary | ICD-10-CM

## 2016-04-21 DIAGNOSIS — Z9189 Other specified personal risk factors, not elsewhere classified: Secondary | ICD-10-CM

## 2016-04-21 DIAGNOSIS — Z809 Family history of malignant neoplasm, unspecified: Secondary | ICD-10-CM

## 2016-04-21 DIAGNOSIS — Z981 Arthrodesis status: Secondary | ICD-10-CM

## 2016-04-21 DIAGNOSIS — M797 Fibromyalgia: Secondary | ICD-10-CM | POA: Diagnosis present

## 2016-04-21 DIAGNOSIS — E669 Obesity, unspecified: Secondary | ICD-10-CM | POA: Diagnosis present

## 2016-04-21 DIAGNOSIS — Z8249 Family history of ischemic heart disease and other diseases of the circulatory system: Secondary | ICD-10-CM

## 2016-04-21 DIAGNOSIS — Z8541 Personal history of malignant neoplasm of cervix uteri: Secondary | ICD-10-CM

## 2016-04-21 DIAGNOSIS — K219 Gastro-esophageal reflux disease without esophagitis: Secondary | ICD-10-CM | POA: Diagnosis present

## 2016-04-21 DIAGNOSIS — G4733 Obstructive sleep apnea (adult) (pediatric): Secondary | ICD-10-CM | POA: Diagnosis present

## 2016-04-21 DIAGNOSIS — Z825 Family history of asthma and other chronic lower respiratory diseases: Secondary | ICD-10-CM

## 2016-04-21 DIAGNOSIS — I10 Essential (primary) hypertension: Secondary | ICD-10-CM | POA: Diagnosis present

## 2016-04-21 DIAGNOSIS — Z9889 Other specified postprocedural states: Secondary | ICD-10-CM

## 2016-04-21 HISTORY — PX: HARDWARE REMOVAL: SHX979

## 2016-04-21 SURGERY — REMOVAL, HARDWARE
Anesthesia: Regional | Site: Wrist | Laterality: Right

## 2016-04-21 MED ORDER — LIDOCAINE 2% (20 MG/ML) 5 ML SYRINGE
INTRAMUSCULAR | Status: DC | PRN
  Administered 2016-04-21: 60 mg via INTRAVENOUS

## 2016-04-21 MED ORDER — OMEPRAZOLE 20 MG PO CPDR
40.0000 mg | DELAYED_RELEASE_CAPSULE | Freq: Two times a day (BID) | ORAL | Status: DC
  Administered 2016-04-21 – 2016-04-24 (×6): 40 mg via ORAL
  Filled 2016-04-21 (×7): qty 2

## 2016-04-21 MED ORDER — ONDANSETRON HCL 4 MG/2ML IJ SOLN: 4.0000 mg | Freq: Four times a day (QID) | INTRAMUSCULAR | Status: DC | PRN

## 2016-04-21 MED ORDER — ONDANSETRON HCL 4 MG/2ML IJ SOLN
INTRAMUSCULAR | Status: DC | PRN
  Administered 2016-04-21: 4 mg via INTRAVENOUS

## 2016-04-21 MED ORDER — CHLORHEXIDINE GLUCONATE 4 % EX LIQD: 60.0000 mL | Freq: Once | CUTANEOUS | Status: DC

## 2016-04-21 MED ORDER — SCOPOLAMINE 1 MG/3DAYS TD PT72
MEDICATED_PATCH | TRANSDERMAL | Status: AC
  Administered 2016-04-21: 1.5 mg via TRANSDERMAL
  Filled 2016-04-21: qty 1

## 2016-04-21 MED ORDER — HYDROMORPHONE HCL 1 MG/ML IJ SOLN
INTRAMUSCULAR | Status: AC
  Administered 2016-04-21: 0.5 mg via INTRAVENOUS
  Filled 2016-04-21: qty 1

## 2016-04-21 MED ORDER — SUGAMMADEX SODIUM 200 MG/2ML IV SOLN
INTRAVENOUS | Status: DC | PRN
  Administered 2016-04-21: 200 mg via INTRAVENOUS

## 2016-04-21 MED ORDER — PHENYLEPHRINE 40 MCG/ML (10ML) SYRINGE FOR IV PUSH (FOR BLOOD PRESSURE SUPPORT)
PREFILLED_SYRINGE | INTRAVENOUS | Status: DC | PRN
  Administered 2016-04-21 (×2): 80 ug via INTRAVENOUS
  Administered 2016-04-21 (×2): 120 ug via INTRAVENOUS

## 2016-04-21 MED ORDER — ONDANSETRON HCL 4 MG PO TABS
4.0000 mg | ORAL_TABLET | Freq: Four times a day (QID) | ORAL | Status: DC | PRN
Start: 2016-04-21 — End: 2016-04-24

## 2016-04-21 MED ORDER — ONDANSETRON HCL 4 MG/2ML IJ SOLN
INTRAMUSCULAR | Status: AC
  Filled 2016-04-21: qty 2

## 2016-04-21 MED ORDER — DIPHENHYDRAMINE HCL 25 MG PO CAPS: 25.0000 mg | ORAL_CAPSULE | ORAL | Status: DC | PRN

## 2016-04-21 MED ORDER — 0.9 % SODIUM CHLORIDE (POUR BTL) OPTIME
TOPICAL | Status: DC | PRN
  Administered 2016-04-21: 1000 mL

## 2016-04-21 MED ORDER — PROPOFOL 10 MG/ML IV BOLUS
INTRAVENOUS | Status: DC | PRN
Start: 2016-04-21 — End: 2016-04-21
  Administered 2016-04-21: 20 mg via INTRAVENOUS
  Administered 2016-04-21: 200 mg via INTRAVENOUS
  Administered 2016-04-21: 30 mg via INTRAVENOUS

## 2016-04-21 MED ORDER — FENTANYL CITRATE (PF) 100 MCG/2ML IJ SOLN
INTRAMUSCULAR | Status: AC
  Filled 2016-04-21: qty 4

## 2016-04-21 MED ORDER — SERTRALINE HCL 100 MG PO TABS
100.0000 mg | ORAL_TABLET | Freq: Every day | ORAL | Status: DC
  Administered 2016-04-21 – 2016-04-23 (×3): 100 mg via ORAL
  Filled 2016-04-21 (×3): qty 1

## 2016-04-21 MED ORDER — PANTOPRAZOLE SODIUM 40 MG PO TBEC: 40.0000 mg | DELAYED_RELEASE_TABLET | Freq: Every day | ORAL | Status: DC

## 2016-04-21 MED ORDER — SCOPOLAMINE 1 MG/3DAYS TD PT72
1.0000 | MEDICATED_PATCH | TRANSDERMAL | Status: DC
  Administered 2016-04-21: 1.5 mg via TRANSDERMAL
  Filled 2016-04-21: qty 1

## 2016-04-21 MED ORDER — ROCURONIUM BROMIDE 50 MG/5ML IV SOSY
PREFILLED_SYRINGE | INTRAVENOUS | Status: AC
  Filled 2016-04-21: qty 5

## 2016-04-21 MED ORDER — PROPOFOL 10 MG/ML IV BOLUS
INTRAVENOUS | Status: AC
  Filled 2016-04-21: qty 20

## 2016-04-21 MED ORDER — HYDROMORPHONE HCL 1 MG/ML IJ SOLN
INTRAMUSCULAR | Status: AC
  Administered 2016-04-21: 0.5 mg via INTRAVENOUS
  Filled 2016-04-21: qty 0.5

## 2016-04-21 MED ORDER — HYDROMORPHONE HCL 1 MG/ML IJ SOLN
0.2500 mg | INTRAMUSCULAR | Status: DC | PRN
  Administered 2016-04-21 (×4): 0.5 mg via INTRAVENOUS

## 2016-04-21 MED ORDER — FENTANYL CITRATE (PF) 100 MCG/2ML IJ SOLN
INTRAMUSCULAR | Status: DC | PRN
  Administered 2016-04-21: 100 ug via INTRAVENOUS
  Administered 2016-04-21 (×2): 50 ug via INTRAVENOUS

## 2016-04-21 MED ORDER — CEFAZOLIN IN D5W 1 GM/50ML IV SOLN
1.0000 g | Freq: Three times a day (TID) | INTRAVENOUS | Status: DC
  Administered 2016-04-22 – 2016-04-24 (×8): 1 g via INTRAVENOUS
  Filled 2016-04-21 (×9): qty 50

## 2016-04-21 MED ORDER — FENTANYL CITRATE (PF) 100 MCG/2ML IJ SOLN
INTRAMUSCULAR | Status: AC
  Administered 2016-04-21: 50 ug via INTRAVENOUS
  Filled 2016-04-21: qty 2

## 2016-04-21 MED ORDER — METHOCARBAMOL 500 MG PO TABS
500.0000 mg | ORAL_TABLET | Freq: Four times a day (QID) | ORAL | Status: DC | PRN
  Administered 2016-04-21 – 2016-04-24 (×9): 500 mg via ORAL
  Filled 2016-04-21 (×10): qty 1

## 2016-04-21 MED ORDER — SODIUM CHLORIDE 0.45 % IV SOLN
INTRAVENOUS | Status: DC
  Administered 2016-04-21: 20:00:00 via INTRAVENOUS

## 2016-04-21 MED ORDER — EPHEDRINE SULFATE-NACL 50-0.9 MG/10ML-% IV SOSY
PREFILLED_SYRINGE | INTRAVENOUS | Status: DC | PRN
  Administered 2016-04-21: 5 mg via INTRAVENOUS

## 2016-04-21 MED ORDER — GABAPENTIN 300 MG PO CAPS
900.0000 mg | ORAL_CAPSULE | Freq: Three times a day (TID) | ORAL | Status: DC
  Administered 2016-04-21 – 2016-04-24 (×8): 900 mg via ORAL
  Filled 2016-04-21 (×9): qty 3

## 2016-04-21 MED ORDER — CEFAZOLIN IN D5W 1 GM/50ML IV SOLN: 1.0000 g | INTRAVENOUS | Status: DC

## 2016-04-21 MED ORDER — MIDAZOLAM HCL 2 MG/2ML IJ SOLN
INTRAMUSCULAR | Status: AC
Start: 2016-04-21 — End: 2016-04-21
  Administered 2016-04-21: 2 mg via INTRAVENOUS
  Filled 2016-04-21: qty 2

## 2016-04-21 MED ORDER — OXYCODONE HCL 5 MG PO TABS
5.0000 mg | ORAL_TABLET | ORAL | Status: DC | PRN
  Administered 2016-04-21 – 2016-04-23 (×8): 10 mg via ORAL
  Filled 2016-04-21 (×10): qty 2

## 2016-04-21 MED ORDER — LACTATED RINGERS IV SOLN
INTRAVENOUS | Status: DC | PRN
  Administered 2016-04-21 (×2): via INTRAVENOUS

## 2016-04-21 MED ORDER — EPHEDRINE 5 MG/ML INJ
INTRAVENOUS | Status: AC
  Filled 2016-04-21: qty 10

## 2016-04-21 MED ORDER — PHENYLEPHRINE 40 MCG/ML (10ML) SYRINGE FOR IV PUSH (FOR BLOOD PRESSURE SUPPORT)
PREFILLED_SYRINGE | INTRAVENOUS | Status: AC
  Filled 2016-04-21: qty 10

## 2016-04-21 MED ORDER — CLONAZEPAM 0.5 MG PO TABS
0.5000 mg | ORAL_TABLET | Freq: Every day | ORAL | Status: DC
  Administered 2016-04-22 – 2016-04-24 (×3): 0.5 mg via ORAL
  Filled 2016-04-21 (×3): qty 1

## 2016-04-21 MED ORDER — MORPHINE SULFATE (PF) 2 MG/ML IV SOLN
1.0000 mg | INTRAVENOUS | Status: DC | PRN
  Administered 2016-04-22 – 2016-04-23 (×6): 1 mg via INTRAVENOUS
  Filled 2016-04-21 (×7): qty 1

## 2016-04-21 MED ORDER — SENNA 8.6 MG PO TABS
1.0000 | ORAL_TABLET | Freq: Two times a day (BID) | ORAL | Status: DC
  Administered 2016-04-21 – 2016-04-24 (×6): 8.6 mg via ORAL
  Filled 2016-04-21 (×6): qty 1

## 2016-04-21 MED ORDER — NON FORMULARY: 40.0000 mg | Freq: Two times a day (BID) | Status: DC

## 2016-04-21 MED ORDER — ROCURONIUM BROMIDE 10 MG/ML (PF) SYRINGE
PREFILLED_SYRINGE | INTRAVENOUS | Status: DC | PRN
  Administered 2016-04-21: 50 mg via INTRAVENOUS

## 2016-04-21 MED ORDER — METHOCARBAMOL 500 MG PO TABS: 500.0000 mg | ORAL_TABLET | Freq: Four times a day (QID) | ORAL | Status: DC | PRN

## 2016-04-21 MED ORDER — CEFAZOLIN SODIUM-DEXTROSE 2-4 GM/100ML-% IV SOLN
2.0000 g | INTRAVENOUS | Status: AC
Start: 2016-04-22 — End: 2016-04-21
  Administered 2016-04-21: 2 g via INTRAVENOUS

## 2016-04-21 MED ORDER — BUPIVACAINE HCL (PF) 0.25 % IJ SOLN
INTRAMUSCULAR | Status: AC
  Filled 2016-04-21: qty 20

## 2016-04-21 MED ORDER — MIDAZOLAM HCL 2 MG/2ML IJ SOLN
2.0000 mg | Freq: Once | INTRAMUSCULAR | Status: AC
  Administered 2016-04-21: 2 mg via INTRAVENOUS

## 2016-04-21 MED ORDER — PHENYLEPHRINE HCL 10 MG/ML IJ SOLN
INTRAVENOUS | Status: DC | PRN
  Administered 2016-04-21: 20 ug/min via INTRAVENOUS

## 2016-04-21 MED ORDER — FENTANYL CITRATE (PF) 100 MCG/2ML IJ SOLN
50.0000 ug | Freq: Once | INTRAMUSCULAR | Status: AC
  Administered 2016-04-21: 50 ug via INTRAVENOUS

## 2016-04-21 MED ORDER — DIPHENHYDRAMINE HCL 25 MG PO CAPS
50.0000 mg | ORAL_CAPSULE | Freq: Four times a day (QID) | ORAL | Status: DC | PRN
  Administered 2016-04-21 – 2016-04-23 (×8): 50 mg via ORAL
  Filled 2016-04-21: qty 2
  Filled 2016-04-21 (×4): qty 1
  Filled 2016-04-21 (×3): qty 2
  Filled 2016-04-21 (×2): qty 1
  Filled 2016-04-21: qty 2

## 2016-04-21 MED ORDER — LACTATED RINGERS IV SOLN
INTRAVENOUS | Status: DC
  Administered 2016-04-21: 14:00:00 via INTRAVENOUS

## 2016-04-21 MED ORDER — CLONAZEPAM 1 MG PO TABS
1.0000 mg | ORAL_TABLET | Freq: Every day | ORAL | Status: DC
  Administered 2016-04-21 – 2016-04-23 (×3): 1 mg via ORAL
  Filled 2016-04-21 (×3): qty 1

## 2016-04-21 MED ORDER — FAMOTIDINE 20 MG PO TABS: 20.0000 mg | ORAL_TABLET | Freq: Two times a day (BID) | ORAL | Status: DC | PRN

## 2016-04-21 MED ORDER — BUPIVACAINE-EPINEPHRINE (PF) 0.5% -1:200000 IJ SOLN
INTRAMUSCULAR | Status: DC | PRN
  Administered 2016-04-21: 30 mL via PERINEURAL

## 2016-04-21 MED ORDER — METHOCARBAMOL 1000 MG/10ML IJ SOLN
500.0000 mg | Freq: Four times a day (QID) | INTRAMUSCULAR | Status: DC | PRN
  Filled 2016-04-21: qty 5

## 2016-04-21 MED ORDER — LIDOCAINE 2% (20 MG/ML) 5 ML SYRINGE
INTRAMUSCULAR | Status: AC
  Filled 2016-04-21: qty 5

## 2016-04-21 MED ORDER — FLUTICASONE PROPIONATE 50 MCG/ACT NA SUSP
2.0000 | Freq: Every day | NASAL | Status: DC
Start: 2016-04-21 — End: 2016-04-24
  Administered 2016-04-21 – 2016-04-23 (×3): 2 via NASAL
  Filled 2016-04-21: qty 16

## 2016-04-21 MED ORDER — SUGAMMADEX SODIUM 200 MG/2ML IV SOLN
INTRAVENOUS | Status: AC
  Filled 2016-04-21: qty 2

## 2016-04-21 SURGICAL SUPPLY — 78 items
BANDAGE ACE 3X5.8 VEL STRL LF (GAUZE/BANDAGES/DRESSINGS) ×6 IMPLANT
BANDAGE ACE 4X5 VEL STRL LF (GAUZE/BANDAGES/DRESSINGS) IMPLANT
BLADE SURG 15 STRL LF DISP TIS (BLADE) ×5 IMPLANT
BLADE SURG 15 STRL SS (BLADE) ×10
BLADE SURG ROTATE 9660 (MISCELLANEOUS) IMPLANT
BNDG COHESIVE 1X5 TAN STRL LF (GAUZE/BANDAGES/DRESSINGS) IMPLANT
BNDG CONFORM 2 STRL LF (GAUZE/BANDAGES/DRESSINGS) ×3 IMPLANT
BNDG CONFORM 3 STRL LF (GAUZE/BANDAGES/DRESSINGS) ×3 IMPLANT
BNDG ESMARK 4X9 LF (GAUZE/BANDAGES/DRESSINGS) IMPLANT
BNDG GAUZE ELAST 4 BULKY (GAUZE/BANDAGES/DRESSINGS) ×3 IMPLANT
CAP PIN ORTHO PINK (CAP) IMPLANT
CAP PIN PROTECTOR ORTHO WHT (CAP) IMPLANT
CORDS BIPOLAR (ELECTRODE) ×3 IMPLANT
COVER SURGICAL LIGHT HANDLE (MISCELLANEOUS) ×3 IMPLANT
CUFF TOURNIQUET SINGLE 18IN (TOURNIQUET CUFF) ×3 IMPLANT
CUFF TOURNIQUET SINGLE 24IN (TOURNIQUET CUFF) IMPLANT
DECANTER SPIKE VIAL GLASS SM (MISCELLANEOUS) IMPLANT
DRAIN TLS ROUND 10FR (DRAIN) IMPLANT
DRAPE INCISE IOBAN 66X45 STRL (DRAPES) IMPLANT
DRAPE OEC MINIVIEW 54X84 (DRAPES) ×3 IMPLANT
DRAPE SURG 17X23 STRL (DRAPES) IMPLANT
DRAPE U-SHAPE 47X51 STRL (DRAPES) ×3 IMPLANT
DRSG EMULSION OIL 3X3 NADH (GAUZE/BANDAGES/DRESSINGS) ×3 IMPLANT
ELECT REM PT RETURN 9FT ADLT (ELECTROSURGICAL)
ELECTRODE REM PT RTRN 9FT ADLT (ELECTROSURGICAL) IMPLANT
GAUZE SPONGE 2X2 8PLY STRL LF (GAUZE/BANDAGES/DRESSINGS) IMPLANT
GAUZE SPONGE 4X4 12PLY STRL (GAUZE/BANDAGES/DRESSINGS) ×3 IMPLANT
GAUZE XEROFORM 1X8 LF (GAUZE/BANDAGES/DRESSINGS) ×3 IMPLANT
GLOVE BIOGEL M 8.0 STRL (GLOVE) ×3 IMPLANT
GLOVE SS BIOGEL STRL SZ 8 (GLOVE) ×1 IMPLANT
GLOVE SUPERSENSE BIOGEL SZ 8 (GLOVE) ×2
GOWN STRL REUS W/ TWL LRG LVL3 (GOWN DISPOSABLE) ×1 IMPLANT
GOWN STRL REUS W/ TWL XL LVL3 (GOWN DISPOSABLE) ×2 IMPLANT
GOWN STRL REUS W/TWL LRG LVL3 (GOWN DISPOSABLE) ×2
GOWN STRL REUS W/TWL XL LVL3 (GOWN DISPOSABLE) ×4
GUIDEWIRE ORTHO MINI ACTK .045 (WIRE) ×6 IMPLANT
K-WIRE SMTH SNGL TROCAR .028X4 (WIRE)
KIT BASIN OR (CUSTOM PROCEDURE TRAY) ×3 IMPLANT
KIT ROOM TURNOVER OR (KITS) ×3 IMPLANT
KWIRE SMTH SNGL TROCAR .028X4 (WIRE) IMPLANT
LOOP VESSEL MAXI BLUE (MISCELLANEOUS) IMPLANT
MANIFOLD NEPTUNE II (INSTRUMENTS) IMPLANT
NEEDLE 22X1 1/2 (OR ONLY) (NEEDLE) IMPLANT
NEEDLE BLUNT 16X1.5 OR ONLY (NEEDLE) IMPLANT
NEEDLE HYPO 25GX1X1/2 BEV (NEEDLE) IMPLANT
NS IRRIG 1000ML POUR BTL (IV SOLUTION) ×3 IMPLANT
PACK ORTHO EXTREMITY (CUSTOM PROCEDURE TRAY) ×3 IMPLANT
PAD ARMBOARD 7.5X6 YLW CONV (MISCELLANEOUS) ×6 IMPLANT
PAD CAST 3X4 CTTN HI CHSV (CAST SUPPLIES) ×2 IMPLANT
PAD CAST 4YDX4 CTTN HI CHSV (CAST SUPPLIES) ×2 IMPLANT
PADDING CAST COTTON 3X4 STRL (CAST SUPPLIES) ×4
PADDING CAST COTTON 4X4 STRL (CAST SUPPLIES) ×4
PADDING CAST SYNTHETIC 2 (CAST SUPPLIES)
PADDING CAST SYNTHETIC 2X4 NS (CAST SUPPLIES) IMPLANT
SOLUTION BETADINE 4OZ (MISCELLANEOUS) ×3 IMPLANT
SPONGE GAUZE 2X2 STER 10/PKG (GAUZE/BANDAGES/DRESSINGS)
SPONGE LAP 4X18 X RAY DECT (DISPOSABLE) IMPLANT
SPONGE SCRUB IODOPHOR (GAUZE/BANDAGES/DRESSINGS) ×3 IMPLANT
STAPLER VISISTAT 35W (STAPLE) IMPLANT
SUCTION FRAZIER HANDLE 10FR (MISCELLANEOUS) ×2
SUCTION TUBE FRAZIER 10FR DISP (MISCELLANEOUS) ×1 IMPLANT
SUT ETHILON 4 0 PS 2 18 (SUTURE) IMPLANT
SUT FIBERWIRE 4-0 18 TAPR NDL (SUTURE) ×3
SUT MERSILENE 4 0 P 3 (SUTURE) IMPLANT
SUT PROLENE 4 0 PS 2 18 (SUTURE) ×15 IMPLANT
SUT VIC AB 2-0 CT1 27 (SUTURE)
SUT VIC AB 2-0 CT1 TAPERPNT 27 (SUTURE) IMPLANT
SUT VIC AB 3-0 FS2 27 (SUTURE) ×3 IMPLANT
SUTURE FIBERWR 4-0 18 TAPR NDL (SUTURE) ×1 IMPLANT
SYR CONTROL 10ML LL (SYRINGE) IMPLANT
SYSTEM CHEST DRAIN TLS 7FR (DRAIN) ×3 IMPLANT
TOWEL OR 17X24 6PK STRL BLUE (TOWEL DISPOSABLE) ×3 IMPLANT
TOWEL OR 17X26 10 PK STRL BLUE (TOWEL DISPOSABLE) ×3 IMPLANT
TUBE CONNECTING 12'X1/4 (SUCTIONS) ×1
TUBE CONNECTING 12X1/4 (SUCTIONS) ×2 IMPLANT
TUBE EVACUATION TLS (MISCELLANEOUS) ×3 IMPLANT
UNDERPAD 30X30 (UNDERPADS AND DIAPERS) ×3 IMPLANT
YANKAUER SUCT BULB TIP NO VENT (SUCTIONS) IMPLANT

## 2016-04-21 NOTE — Anesthesia Procedure Notes (Signed)
Anesthesia Regional Block:  Axillary brachial plexus block  Pre-Anesthetic Checklist: ,, timeout performed, Correct Patient, Correct Site, Correct Laterality, Correct Procedure, Correct Position, site marked, Risks and benefits discussed,  Surgical consent,  Pre-op evaluation,  At surgeon's request and post-op pain management  Laterality: Right  Prep: chloraprep       Needles:   Needle Type: Echogenic Stimulator Needle     Needle Length: 9cm 9 cm Needle Gauge: 21 and 21 G    Additional Needles:  Procedures: ultrasound guided (picture in chart) and nerve stimulator Axillary brachial plexus block  Nerve Stimulator or Paresthesia:  Response: median, radial, ulnar and musculocutaneous, 0.5 mA,   Additional Responses:   Narrative:  Start time: 04/21/2016 2:58 PM End time: 04/21/2016 3:08 PM Injection made incrementally with aspirations every 5 mL.  Performed by: Personally  Anesthesiologist: Suzette Battiest
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
## 2016-04-21 NOTE — Anesthesia Procedure Notes (Signed)
Procedure Name: Intubation Date/Time: 04/21/2016 4:20 PM Performed by: Garrison Columbus T Pre-anesthesia Checklist: Patient identified, Emergency Drugs available, Suction available and Patient being monitored Patient Re-evaluated:Patient Re-evaluated prior to inductionOxygen Delivery Method: Circle System Utilized Preoxygenation: Pre-oxygenation with 100% oxygen Intubation Type: IV induction Ventilation: Mask ventilation without difficulty and Oral airway inserted - appropriate to patient size Laryngoscope Size: Sabra Heck and 2 Grade View: Grade I Tube type: Oral Tube size: 7.5 mm Number of attempts: 1 Airway Equipment and Method: Stylet and Oral airway Placement Confirmation: ETT inserted through vocal cords under direct vision,  positive ETCO2 and breath sounds checked- equal and bilateral Secured at: 22 cm Tube secured with: Tape Dental Injury: Teeth and Oropharynx as per pre-operative assessment

## 2016-04-21 NOTE — Transfer of Care (Signed)
Immediate Anesthesia Transfer of Care Note  Patient: Sabrina [name]  Procedure(s) Performed: Procedure(s): HARDWARE REMOVAL RIGHT WRIST WITH proximal pole scaphoid excision and partiel scaphoidectomy and  tenosynoviodectomy (Right)  Patient Location: PACU  Anesthesia Type:General and Regional  Level of Consciousness: awake, alert  and oriented  Airway & Oxygen Therapy: Patient Spontanous Breathing and Patient connected to nasal cannula oxygen  Post-op Assessment: Report given to RN, Post -op Vital signs reviewed and stable and Patient moving all extremities X 4  Post vital signs: Reviewed and stable  Last Vitals:  Vitals:   04/21/16 1519 04/21/16 1822  BP: (!) 102/58   Pulse:    Resp: 17   Temp:  36.7 C    Last Pain:  Vitals:   04/21/16 1408  TempSrc:   PainSc: 5          Complications: No apparent anesthesia complications

## 2016-04-21 NOTE — Anesthesia Preprocedure Evaluation (Addendum)
Anesthesia Evaluation  Patient identified by MRN, date of birth, ID band Patient awake    Reviewed: Allergy & Precautions, NPO status , Patient's Chart, lab work & pertinent test results  History of Anesthesia Complications (+) PONV and history of anesthetic complications  Airway Mallampati: II  TM Distance: >3 FB Neck ROM: Full    Dental  (+) Dental Advisory Given, Teeth Intact,    Pulmonary sleep apnea ,    breath sounds clear to auscultation       Cardiovascular hypertension, Pt. on medications  Rhythm:Regular Rate:Normal     Neuro/Psych  Headaches, Anxiety Depression  Neuromuscular disease    GI/Hepatic Neg liver ROS, GERD  ,  Endo/Other  Morbid obesity  Renal/GU negative Renal ROS     Musculoskeletal  (+) Arthritis , Fibromyalgia -  Abdominal   Peds  Hematology  (+) anemia ,   Anesthesia Other Findings   Reproductive/Obstetrics                          Lab Results  Component Value Date   WBC 7.6 04/14/2016   HGB 11.8 (L) 04/14/2016   HCT 36.9 04/14/2016   MCV 75.9 (L) 04/14/2016   PLT 304 04/14/2016   Lab Results  Component Value Date   CREATININE 0.78 04/14/2016   BUN 16 04/14/2016   NA 138 04/14/2016   K 4.2 04/14/2016   CL 106 04/14/2016   CO2 22 04/14/2016    Anesthesia Physical Anesthesia Plan  ASA: III  Anesthesia Plan: General and Regional   Post-op Pain Management:  Regional for Post-op pain   Induction: Intravenous  Airway Management Planned: Oral ETT  Additional Equipment:   Intra-op Plan:   Post-operative Plan: Extubation in OR  Informed Consent: I have reviewed the patients History and Physical, chart, labs and discussed the procedure including the risks, benefits and alternatives for the proposed anesthesia with the patient or authorized representative who has indicated his/her understanding and acceptance.   Dental advisory given  Plan  Discussed with: CRNA and Anesthesiologist  Anesthesia Plan Comments:      Anesthesia Quick Evaluation

## 2016-04-21 NOTE — Anesthesia Postprocedure Evaluation (Signed)
Anesthesia Post Note  Patient: Sabrina [name]  Procedure(s) Performed: Procedure(s) (LRB): HARDWARE REMOVAL RIGHT WRIST WITH proximal pole scaphoid excision and partiel scaphoidectomy and  tenosynoviodectomy (Right)  Patient location during evaluation: PACU Anesthesia Type: General and Regional Level of consciousness: awake and alert Pain management: pain level controlled Vital Signs Assessment: post-procedure vital signs reviewed and stable Respiratory status: spontaneous breathing, nonlabored ventilation, respiratory function stable and patient connected to nasal cannula oxygen Cardiovascular status: blood pressure returned to baseline and stable Postop Assessment: no signs of nausea or vomiting Anesthetic complications: no       Last Vitals:  Vitals:   04/21/16 1937 04/21/16 2008  BP: 121/66 125/69  Pulse: 84 93  Resp: 13 16  Temp:  36.8 C    Last Pain:  Vitals:   04/21/16 2011  TempSrc:   PainSc: [place] Machell Wirthlin

## 2016-04-21 NOTE — Op Note (Signed)
See [id number] Amedeo Plenty MD

## 2016-04-22 ENCOUNTER — Encounter (HOSPITAL_COMMUNITY): Payer: Self-pay | Admitting: Orthopedic Surgery

## 2016-04-22 ENCOUNTER — Ambulatory Visit: Payer: BC Managed Care – PPO | Admitting: Neurology

## 2016-04-22 MED ORDER — MENTHOL 3 MG MT LOZG
1.0000 | LOZENGE | OROMUCOSAL | Status: DC | PRN
  Administered 2016-04-22 (×2): 3 mg via ORAL
  Filled 2016-04-22 (×3): qty 9

## 2016-04-22 MED ORDER — CELECOXIB 200 MG PO CAPS
200.0000 mg | ORAL_CAPSULE | Freq: Every day | ORAL | Status: DC
  Administered 2016-04-22 – 2016-04-24 (×3): 200 mg via ORAL
  Filled 2016-04-22 (×3): qty 1

## 2016-04-22 NOTE — Op Note (Signed)
NAME:  Sabrina [name], Sabrina [name]                  ACCOUNT NO.:  MEDICAL RECORD NO.:  [id number]  LOCATION:                                 FACILITY:  PHYSICIAN:  Satira Anis. Oasis Goehring, M.D.     DATE OF BIRTH:  DATE OF PROCEDURE:04/21/2016 DATE OF DISCHARGE:TBD                              OPERATIVE REPORT   PREOPERATIVE DIAGNOSES: 1. 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 2. Retained hardware, right wrist.  POSTOPERATIVE DIAGNOSES: 1. 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 2. Retained hardware, right wrist.  PROCEDURE: 1. Removal of deep hardware, right wrist; plates and screws. 2. Excision portions of the proximal pole of the scaphoid, right     wrist. 3. Tenolysis/tenosynovectomy, radical in nature, extensor apparatus,     right wrist. 4. Partial styloidectomy, right wrist. 5. Four-view x-ray series, right wrist.  SURGEON:  Satira Anis. Amedeo Plenty, M.D.  ASSISTANT:  Avelina Laine, PA-C.  COMPLICATIONS:  None.  ANESTHESIA:  General preoperative block.  TOURNIQUET TIME:  Less than 90 minutes.  INDICATIONS:  A 55 year old female presents with the above-mentioned diagnosis.  She appears to have a successful radiolunate fusion, but her scaphoid fusion is incomplete and certainly there is poor performance of the bony architecture.  I have discussed the risks and benefits, do's and don'ts, options, timeframe duration of recovery.  With this in mind, she desires to proceed.  OPERATIVE PROCEDURE:  The patient was seen by myself and Anesthesia. Taken to the operative theater, scrubbed with Hibiclens pre-scrub, given a general anesthetic.  Then, underwent Betadine scrub and paint. Outline marks were made.  Tourniquet was insufflated and time-out was observed.  Preoperative antibiotics were given.  Following this, modified incision was made, zigzag in nature over the dorsal radial apparatus  of the wrist.  The patient tolerated this well.  I dissected down and performed a radical tenolysis, tenosynovectomy of the ECRB, EPL, and EDC tendons.  The patient had poor tolerance to the Titanium implant, there was a lot of metallosis.  I very carefully performed tenolysis tenosynovectomy without difficulty and following this, dissected down to the plate.  The plate was removed in its entirety utilizing an orthopedic technique of careful periosteal dissection.  The screws in the lunate were solid, the screws in the scaphoid were loose, and there was a large amount of reactivity.  It was quite apparent she did not enjoy the Titanium in terms of the bony confines of the scaphoid and radius.  Following hardware removal, I took x-rays.  The patient's proximal pole was scaphoid, it was very incompetent and poor feeding. At this time, given the fact that the radial lunate fusion was confirmed solid, we then looked at possibly performing a repeat fusion of the scaphoid to the radius; however, the bone quality was extremely poor and given the fact this one was difficult bones to heal in the body, I chose to perform a proximal pole partial excision and kept the distal pole intact.  The distal pole was excised to the point where it would not impinge against the radial styloid.  I also performed a radial styloidectomy, but preserved the radioscaphoid capitate ligament.  There were no complicating features.  Following this, we irrigated copiously.  AP, lateral, and oblique x-rays were performed, examined, and interpreted by myself, looked to be excellent position.  My thoughts are that with some degree of proximal pole excision, she still has the articular surface attached to the scapholunate interosseous ligament and that this will help give some stability to the capitate.  The capitate did not sublux out of the lunate fossa on radial and ulnar deviation attempts with compression and thus it  appeared to be a fairly stable construct.  We noted that in the past the patients have been treated for proximal pole nonunion to the scaphoid with excision only going to DISI deformity about the wrist.  In Ms. Maudlin's case her lunate will not perform pain in this fashion as it is fused successfully to the radius.  Thus given the poor quality of bone and the reactionary tissue issues, we felt that this would be her best chance to become pain- free and limit the amount of surgical endeavors at this time.  If she fails this surgery and was painful long-term, one would have to consider a formal total wrist arthrodesis.  The patient tolerated the procedure well.  Do's and don'ts have been discussed.  All questions have been encouraged and answered.  It was a pleasure to see her today and participate in her care.  She will be monitored overnight.  I should note she was closed with Prolene.  We did place a TLS drain and I did perform imbrication of soft tissue in the defect between the radius and the proximal pole of the scaphoid while left the piece of cartilage to simply keep the capitate smooth in terms of its center position.  This was a fascial interposition.  I feel this gives her the best chance of not having chronic pain in this region.  Thumb spica splint was applied.  I will see her back in the office in 2 weeks.  We will obtain x-rays at that time.  I will keep her in a cast for 6 weeks, short-arm thumb spike in nature and then begin some gentle interval range of motion activities.  Do's and don'ts have been discussed and all questions have been encouraged and answered.     Satira Anis. Amedeo Plenty, M.D.    [facility]  D:  04/21/2016  T:  04/22/2016  Job:  [id number]

## 2016-04-22 NOTE — Evaluation (Signed)
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  Pt has a past medical history of Anxiety and depression; Arthritis; Cancer; Chronic fatigue; Chronic low back pain; Chronic pain of right wrist; Disturbance of smell and taste (2013); Dysfunctional uterine bleeding; Fibromyalgia; Frequency of urination; GERD; History of cervical cancer; History of wrist fracture; Hyperlipidemia; Hypertension; METABOLIC SYNDROME X ([id number]); Migraine syndrome; Obesity; Peripheral neuropathy; and Urgency of urination. Pt has a past surgical history that includes sinus surgery for septal deviation (1999); Wrist surgery; Knee arthroscopy (04/1988); Microdiscectomy lumbar (09/2003); Plantar fascia release (02/2009); Combined hysteroscopy diagnostic / D&C (03/2008); Anal sphincterotomy (2007); hemorrhoid surgery (2006); Cervical cone biopsy (1993); Cardiovascular stress test (03/08/14); transthoracic echocardiogram (03/08/14); Dilation and curettage of uterus; Back surgery; Colonoscopy; Upper gi endoscopy; Diagnostic laparoscopy; hamstring reattachment; Lumbar spine surgery (2005; 01/2015); and Hardware Removal (Right, 04/21/2016).   Clinical Impression   PTA Pt independent in ADL and mobility. Pt currently modified independent in ADL and mobility. Pt limited by pain this session. Pt educated on compensatory strategies for ADL and educated on importance of elevation for edema management and pain management. Pt has no questions or concerns for herself going home. 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 No further OT needs at this time. OT to sign off thank you for this referral.     Follow Up Recommendations  No OT follow up;Supervision - Intermittent    Equipment Recommendations  None recommended by OT    Recommendations for Other Services       Precautions / Restrictions Precautions Precautions: None Precaution Comments: Elevate arm Required Braces or Orthoses: Other Brace/Splint (Right Wrist cast) Restrictions Weight Bearing Restrictions: No      Mobility Bed Mobility Overal bed mobility: Modified Independent             General bed mobility comments: increased time due to pain, able to get in and out of bed and manage elevated positioning of RUE  Transfers Overall transfer level: Modified independent Equipment used: None             General transfer comment: no dizziness or lightheadedness upon standing    Balance Overall balance assessment: No apparent balance deficits (not formally assessed)                                          ADL Overall ADL's : Modified independent;At baseline (Pt has been compensating with LUE for a while now)                                       General ADL Comments: Pt able to ambulate into bathroom, perform toileting, peri care, wash L hand, and reposition RUE (elevated) after returning to bed. Pt educated on importance of elevation. Pt with no further questinos or concerns. 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     Vision Vision Assessment?:  No apparent visual deficits   Perception     Praxis      Pertinent Vitals/Pain Pain Assessment: 0-10 Pain Score: 10-Worst pain ever Pain Location: R wrist Pain Descriptors / Indicators: Constant;Discomfort;Grimacing;Guarding;Pressure;Pounding;Throbbing;Restless Pain Intervention(s): Monitored during session;Patient requesting pain meds-RN notified (checked with RN, unable to get pain medicine at this time)      Hand Dominance Right   Extremity/Trunk Assessment Upper Extremity Assessment Upper Extremity Assessment: RUE deficits/detail RUE Deficits / Details: no problems with motion at shoulder or elbow, able to wiggle fingers, but increased pain. Pt educated on moving "exposed" joints   Lower Extremity Assessment Lower Extremity Assessment: Overall WFL for tasks assessed   Cervical / Trunk Assessment Cervical / Trunk Assessment: Other exceptions Cervical / Trunk Exceptions: history of back surgery   Communication Communication Communication: No difficulties   Cognition Arousal/Alertness: Awake/alert Behavior During Therapy: WFL for tasks assessed/performed Overall Cognitive Status: Within Functional Limits for tasks assessed                     General Comments       Exercises       Shoulder Instructions      Home Living Family/patient expects to be discharged to:: Private residence Living Arrangements: Parent;Other (Comment) (mother is dependent on Pt and has history of falls) Available Help at Discharge: Family;Available PRN/intermittently (son is home from college and is QB at [employer]) Type of Home: House Home Access: Stairs to enter [employer] of Steps: 2   Home Layout: One level     Bathroom Shower/Tub: Tub/shower unit Shower/tub characteristics: Architectural technologist: Standard                Prior Functioning/Environment Level of Independence: Independent        Comments: 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, and also is caregiver for 53 year old other who has history of multiple falls        OT Problem List: Pain   OT Treatment/Interventions:      OT Goals(Current goals can be found in the care plan section) Acute Rehab OT Goals Patient Stated Goal: to get pain under control OT Goal Formulation: With patient Time For Goal Achievement: 04/29/16 Potential to Achieve Goals: Good  OT  Frequency:     Barriers to D/C:            Co-evaluation              End of Session Nurse Communication: Mobility status;Patient requests pain meds (RN aware, Pt notified she'd had all pain meds)  Activity Tolerance: Patient limited by pain Patient left: in bed;with call bell/phone within reach   Time: 0919-0951 OT Time Calculation (min): 32 min Charges:  OT General Charges $OT Visit: 1 Procedure OT Evaluation $OT Eval Low Complexity: 1 Procedure OT Treatments $Self Care/Home Management : 8-22 mins G-Codes: OT G-codes **NOT FOR INPATIENT CLASS** Functional Assessment Tool Used: Clinical Judgement Functional Limitation: Self care Self Care Current Status [id number]): At least 20 percent but less than 40 percent impaired, limited or restricted Self Care Goal Status [id number]): At least 1 percent but less than 20 percent impaired, limited or restricted Self Care Discharge Status [phone]): At least 20 percent but less than 40 percent impaired, limited or restricted  Jaci Carrel 04/22/2016, 11:23 AM Hulda Humphrey OTR/L [phone]

## 2016-04-22 NOTE — Progress Notes (Signed)
PT Cancellation Note/Discharge  Patient Details Name: GIOVANNA MARTELLA MRN: [id number] DOB: Jan 29, 1961   Cancelled Treatment:    Reason Eval/Treat Not Completed: OT screened, no needs identified, will sign off.  PT screened for needs by OT and no PT needs identified at this time.  PT to sign off. See OT notes for mobility details.    Thanks,    Barbarann Ehlers. Kyan Yurkovich, PT, DPT [phone]   04/22/2016, 9:56 AM

## 2016-04-22 NOTE — Progress Notes (Signed)
Subjective: 1 Day Post-Op Procedure(s) (LRB): HARDWARE REMOVAL RIGHT WRIST WITH proximal pole scaphoid excision and partiel scaphoidectomy and  tenosynoviodectomy (Right) Patient reports pain as fairly significant at this juncture. She questions if a stronger medication can be used. She denies numbness, tingling, nausea or vomiting. She is tolerating a regular diet.  Objective: Vital signs in last 24 hours: Temp:  [97.5 F (36.4 C)-98.4 F (36.9 C)] 97.5 F (36.4 C) (12/20 0448) Pulse Rate:  [77-105] 79 (12/20 0448) Resp:  [10-16] 16 (12/20 0448) BP: (106-125)/(59-69) 118/64 (12/20 0448) SpO2:  [91 %-99 %] 96 % (12/20 0448)  Intake/Output from previous day: 12/19 0701 - 12/20 0700 In: 1200 [I.V.:1200] Out: 52 [Urine:2; Blood:50] Intake/Output this shift: Total I/O In: 240 [P.O.:240] Out: -   No results for input(s): HGB in the last 72 hours. No results for input(s): WBC, RBC, HCT, PLT in the last 72 hours. No results for input(s): NA, K, CL, CO2, BUN, CREATININE, GLUCOSE, CALCIUM in the last 72 hours. No results for input(s): LABPT, INR in the last 72 hours.  The patient is alert and oriented in no acute distress. The patient complains of pain in the affected upper extremity.  The patient is noted to have a normal HEENT exam. Lung fields show equal chest expansion and no shortness of breath. Abdomen exam is nontender without distention. Lower extremity examination does not show any fracture dislocation or blood clot symptoms. Pelvis is stable and the neck and back are stable and nontender. Evaluation of the right upper extremity shows that her splint is clean and dry and intact. The drain is removed without difficulties. Sensation and refill is intact there's no sign of infection or dystrophy present. Digital range of motion is intact but with some degree of pain.  Assessment/Plan: 1 Day Post-Op Procedure(s) (LRB): HARDWARE REMOVAL RIGHT WRIST WITH proximal pole scaphoid  excision and partiel scaphoidectomy and  tenosynoviodectomy (Right) At this juncture we are going to progress her to oral hydromorphone better pain control and in addition resume her home dose of gabapentin 300 mg 3 pills 3 times daily well as her Celebrex. We will continue to observe her closely and tentatively discharge her tomorrow and degree evaluation. We have discussed with her during the interim continuing elevation, edema control finger range of motion is encouraged and answered.  Gissel Keilman L 04/22/2016, 5:10 PM

## 2016-04-23 ENCOUNTER — Observation Stay (HOSPITAL_COMMUNITY): Payer: BC Managed Care – PPO

## 2016-04-23 ENCOUNTER — Encounter (HOSPITAL_COMMUNITY): Payer: Self-pay | Admitting: Orthopedic Surgery

## 2016-04-23 DIAGNOSIS — Z9889 Other specified postprocedural states: Secondary | ICD-10-CM | POA: Diagnosis not present

## 2016-04-23 DIAGNOSIS — G4733 Obstructive sleep apnea (adult) (pediatric): Secondary | ICD-10-CM | POA: Diagnosis present

## 2016-04-23 DIAGNOSIS — I10 Essential (primary) hypertension: Secondary | ICD-10-CM | POA: Diagnosis present

## 2016-04-23 DIAGNOSIS — Z8541 Personal history of malignant neoplasm of cervix uteri: Secondary | ICD-10-CM | POA: Diagnosis not present

## 2016-04-23 DIAGNOSIS — G8929 Other chronic pain: Secondary | ICD-10-CM | POA: Diagnosis present

## 2016-04-23 DIAGNOSIS — Z981 Arthrodesis status: Secondary | ICD-10-CM | POA: Diagnosis not present

## 2016-04-23 DIAGNOSIS — Z809 Family history of malignant neoplasm, unspecified: Secondary | ICD-10-CM | POA: Diagnosis not present

## 2016-04-23 DIAGNOSIS — M960 Pseudarthrosis after fusion or arthrodesis: Secondary | ICD-10-CM | POA: Diagnosis present

## 2016-04-23 DIAGNOSIS — M797 Fibromyalgia: Secondary | ICD-10-CM | POA: Diagnosis present

## 2016-04-23 DIAGNOSIS — E669 Obesity, unspecified: Secondary | ICD-10-CM | POA: Diagnosis present

## 2016-04-23 DIAGNOSIS — Z825 Family history of asthma and other chronic lower respiratory diseases: Secondary | ICD-10-CM | POA: Diagnosis not present

## 2016-04-23 DIAGNOSIS — Z6841 Body Mass Index (BMI) 40.0 and over, adult: Secondary | ICD-10-CM | POA: Diagnosis not present

## 2016-04-23 DIAGNOSIS — Z8249 Family history of ischemic heart disease and other diseases of the circulatory system: Secondary | ICD-10-CM | POA: Diagnosis not present

## 2016-04-23 DIAGNOSIS — K219 Gastro-esophageal reflux disease without esophagitis: Secondary | ICD-10-CM | POA: Diagnosis present

## 2016-04-23 DIAGNOSIS — Z886 Allergy status to analgesic agent status: Secondary | ICD-10-CM | POA: Diagnosis not present

## 2016-04-23 DIAGNOSIS — Z79899 Other long term (current) drug therapy: Secondary | ICD-10-CM | POA: Diagnosis not present

## 2016-04-23 MED ORDER — MAGNESIUM CITRATE PO SOLN
1.0000 | Freq: Once | ORAL | Status: AC
  Administered 2016-04-23: 1 via ORAL
  Filled 2016-04-23: qty 296

## 2016-04-23 MED ORDER — TAPENTADOL HCL 50 MG PO TABS
50.0000 mg | ORAL_TABLET | ORAL | Status: DC | PRN
  Administered 2016-04-23 – 2016-04-24 (×4): 50 mg via ORAL
  Filled 2016-04-23 (×4): qty 1

## 2016-04-23 NOTE — Progress Notes (Signed)
Subjective: 2 Days Post-Op Procedure(s) (LRB): HARDWARE REMOVAL RIGHT WRIST WITH proximal pole scaphoid excision and partiel scaphoidectomy and  tenosynoviodectomy (Right) Patient reports pain as  improved overall. She unfortunately, has a significant amount of pruritus a variety of opioids. We discussed these issues with her at length. She does complain of "wheezing" today. However no significant shortness of breath or cough is present is tolerating a regular diet. She has not had a bowel movement since the time of her admit. She admits that she is not using the incentive spirometer to any great deal. She does have pain with motion about the digits and thus she has not been actively trying to move the fingers to any significant degree. She denies nausea, vomiting, chills, however, she states she was noted to have a low-grade fever earlier today. Objective: Vital signs in last 24 hours: Temp:  [97.8 F (36.6 C)-100.6 F (38.1 C)] 98.4 F (36.9 C) (12/21 1534) Pulse Rate:  [65-99] 65 (12/21 1500) Resp:  [16-18] 16 (12/21 1500) BP: (120-136)/(55-74) 136/74 (12/21 1500) SpO2:  [93 %-100 %] 100 % (12/21 1534)  Intake/Output from previous day: 12/20 0701 - 12/21 0700 In: 840 [P.O.:480; I.V.:160; IV Piggyback:200] Out: -  Intake/Output this shift: Total I/O In: 390 [P.O.:390] Out: -   No results for input(s): HGB in the last 72 hours. No results for input(s): WBC, RBC, HCT, PLT in the last 72 hours. No results for input(s): NA, K, CL, CO2, BUN, CREATININE, GLUCOSE, CALCIUM in the last 72 hours. No results for input(s): LABPT, INR in the last 72 hours.  Physical examination:  Patient is currently afebrile, O2 sats ranged from 90-91%, however with incentive spirometer  And verbal coercion she sats at 100% She is pleasant, in no acute distress The patient is alert and oriented in no acute distress. The patient complains of pain in the affected upper extremity.  The patient is noted to have  a normal HEENT exam. Lung fields show equal chest expansion and no shortness of breath, she is noted to have a basilar wheezing with ausculatory examination prominent about the left lung fields, this does improve after spirometry Abdomen exam is nontender without distention. Lower extremity examination does not show any fracture dislocation or blood clot symptoms. Pelvis is stable and the neck and back are stable and nontender.  Assessment/Plan: 2 Days Post-Op Procedure(s) (LRB): HARDWARE REMOVAL RIGHT WRIST WITH proximal pole scaphoid excision and partiel scaphoidectomy and  tenosynoviodectomy (Right) I have had a lengthy discussion with the patient today in regards to all issues and have spent greater than 30 minutes at bedside today. At this juncture we are going to change her from oxycodone and will try Nucynta given the difficulty she has had with other opioids/pruritus. I have recommended strict spirometer use in addition will obtain a chest x-ray. We will plan to ambulate her today and this evening at least 3 times in the hallway. Her IV will be hep-locked and mag citrate will be at bedside. I have discussed with her the fact that she needs to be very diligent in elevating the upper extremity and actively trying to flex and extend her fingers within 30 times per hour during waking hours. Postoperative splint was split to prevent further edema. Overall, I have discussed with the patient the need to be more ambulatory and more engaging in terms of active range of motion of the hand. I have asked the nursing staff to prompt in regards to range of motion of the fingers  and ambulation as well as incentive spirometer We'll tentatively plan on discharge tomorrow. All questions were encouraged and answered. Ronaldo Crilly L 04/23/2016, 4:03 PM

## 2016-04-24 ENCOUNTER — Encounter: Payer: Self-pay | Admitting: Family Medicine

## 2016-04-24 MED ORDER — TAPENTADOL HCL 50 MG PO TABS: 50.0000 mg | ORAL_TABLET | ORAL | 0 refills | Status: DC | PRN
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
## 2016-04-24 NOTE — Discharge Instructions (Signed)

## 2016-05-18 ENCOUNTER — Other Ambulatory Visit: Payer: Self-pay | Admitting: Family Medicine

## 2016-05-18 MED ORDER — OMEPRAZOLE 40 MG PO CPDR: 40.0000 mg | DELAYED_RELEASE_CAPSULE | Freq: Two times a day (BID) | ORAL | 0 refills | Status: DC

## 2016-05-18 NOTE — Telephone Encounter (Signed)
Pt advised and voiced understanding.  Apt made for 05/27/16 at 1:00pm.

## 2016-05-18 NOTE — Telephone Encounter (Signed)
Fax from [facility] [place].  RF request for omeprazole LOV: 12/04/14 Next ov: None Last written: 04/22/15 #60 w/ 12RF  RF request for losartan/hctz Last written: 04/20/16 #30 w/ 0RF  Rx's sent for 30 day supply w/ no refills. Pt is over due for f/u [facility], needs office visit for more refills.   Left message for pt to call back.

## 2016-05-27 ENCOUNTER — Encounter: Payer: Self-pay | Admitting: Family Medicine

## 2016-05-27 ENCOUNTER — Ambulatory Visit (INDEPENDENT_AMBULATORY_CARE_PROVIDER_SITE_OTHER): Payer: BC Managed Care – PPO | Admitting: Family Medicine

## 2016-05-27 VITALS — BP 143/83 | HR 75 | Temp 98.9°F | Resp 16 | Ht 63.5 in | Wt 240.2 lb

## 2016-05-27 DIAGNOSIS — Z1211 Encounter for screening for malignant neoplasm of colon: Secondary | ICD-10-CM | POA: Diagnosis not present

## 2016-05-27 DIAGNOSIS — M797 Fibromyalgia: Secondary | ICD-10-CM

## 2016-05-27 DIAGNOSIS — D509 Iron deficiency anemia, unspecified: Secondary | ICD-10-CM | POA: Diagnosis not present

## 2016-05-27 DIAGNOSIS — I10 Essential (primary) hypertension: Secondary | ICD-10-CM | POA: Diagnosis not present

## 2016-05-27 DIAGNOSIS — R7303 Prediabetes: Secondary | ICD-10-CM | POA: Diagnosis not present

## 2016-05-27 DIAGNOSIS — Z23 Encounter for immunization: Secondary | ICD-10-CM

## 2016-05-27 LAB — CBC WITH DIFFERENTIAL/PLATELET
Basophils Absolute: 0.1 10*3/uL (ref 0.0–0.1)
Basophils Relative: 0.8 % (ref 0.0–3.0)
EOS PCT: 3.1 % (ref 0.0–5.0)
Eosinophils Absolute: 0.2 10*3/uL (ref 0.0–0.7)
HEMATOCRIT: 37 % (ref 36.0–46.0)
HEMOGLOBIN: 12.4 g/dL (ref 12.0–15.0)
Lymphocytes Relative: 36.7 % (ref 12.0–46.0)
Lymphs Abs: 2.4 10*3/uL (ref 0.7–4.0)
MCHC: 33.4 g/dL (ref 30.0–36.0)
MCV: 74.9 fl — AB (ref 78.0–100.0)
MONOS PCT: 7 % (ref 3.0–12.0)
Monocytes Absolute: 0.5 10*3/uL (ref 0.1–1.0)
Neutro Abs: 3.4 10*3/uL (ref 1.4–7.7)
Neutrophils Relative %: 52.4 % (ref 43.0–77.0)
Platelets: 275 10*3/uL (ref 150.0–400.0)
RBC: 4.94 Mil/uL (ref 3.87–5.11)
RDW: 17 % — ABNORMAL HIGH (ref 11.5–15.5)
WBC: 6.4 10*3/uL (ref 4.0–10.5)

## 2016-05-27 LAB — HEMOGLOBIN A1C: HEMOGLOBIN A1C: 5.8 % (ref 4.6–6.5)

## 2016-05-27 LAB — COMPREHENSIVE METABOLIC PANEL
ALBUMIN: 4.5 g/dL (ref 3.5–5.2)
ALT: 11 U/L (ref 0–35)
AST: 13 U/L (ref 0–37)
Alkaline Phosphatase: 87 U/L (ref 39–117)
BUN: 17 mg/dL (ref 6–23)
CALCIUM: 9.4 mg/dL (ref 8.4–10.5)
CHLORIDE: 104 meq/L (ref 96–112)
CO2: 29 mEq/L (ref 19–32)
Creatinine, Ser: 0.84 mg/dL (ref 0.40–1.20)
GFR: 75.36 mL/min (ref >60.00)
Glucose, Bld: 93 mg/dL (ref 70–99)
POTASSIUM: 4.5 meq/L (ref 3.5–5.1)
Sodium: 139 mEq/L (ref 135–145)
Total Bilirubin: 0.4 mg/dL (ref 0.2–1.2)
Total Protein: 6.9 g/dL (ref 6.0–8.3)

## 2016-05-27 LAB — IRON AND TIBC
%SAT: 14 % (ref 11–50)
Iron: 59 ug/dL (ref 45–160)
TIBC: 407 ug/dL (ref 250–450)
UIBC: 348 ug/dL (ref 125–400)

## 2016-05-27 LAB — IRON: Iron: 56 ug/dL (ref 42–145)

## 2016-05-27 LAB — FERRITIN: Ferritin: 17.1 ng/mL (ref 10.0–291.0)

## 2016-05-27 MED ORDER — OXYCODONE HCL 10 MG PO TABS: ORAL_TABLET | ORAL | 0 refills | Status: DC

## 2016-05-27 NOTE — Progress Notes (Signed)
Pre visit review using our clinic review tool, if applicable. No additional management support is needed unless otherwise documented below in the visit note. 
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
## 2016-05-27 NOTE — Progress Notes (Signed)
A user error has taken place: encounter opened in error, closed for administrative reasons.

## 2016-05-28 ENCOUNTER — Encounter: Payer: Self-pay | Admitting: Family Medicine

## 2016-06-01 ENCOUNTER — Ambulatory Visit (INDEPENDENT_AMBULATORY_CARE_PROVIDER_SITE_OTHER): Payer: BC Managed Care – PPO | Admitting: Neurology

## 2016-06-01 ENCOUNTER — Encounter: Payer: Self-pay | Admitting: Neurology

## 2016-06-01 VITALS — BP 136/72 | HR 81 | Ht 63.5 in | Wt 236.4 lb

## 2016-06-01 DIAGNOSIS — M792 Neuralgia and neuritis, unspecified: Secondary | ICD-10-CM

## 2016-06-01 MED ORDER — CARBAMAZEPINE 200 MG PO TABS: ORAL_TABLET | ORAL | 5 refills | Status: DC

## 2016-06-01 MED ORDER — GABAPENTIN 300 MG PO CAPS: 900.0000 mg | ORAL_CAPSULE | Freq: Three times a day (TID) | ORAL | 11 refills | Status: DC
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
## 2016-06-01 NOTE — Patient Instructions (Addendum)
1. Start carbamazepine 200mg : Take 1/2 tablet twice a day for 1 week, then increase to 1 tablet twice a day 2. Continue gabapentin 900mg  three times a day 3. Call our office in 3 months, depending on how you are feeling, we may either increase dose or send referral to Pain Management 4. Wishing you well with the right arm! 5. Follow-up in 4 months, call for any problems

## 2016-06-02 ENCOUNTER — Encounter: Payer: Self-pay | Admitting: Family Medicine

## 2016-06-15 ENCOUNTER — Other Ambulatory Visit: Payer: Self-pay | Admitting: Family Medicine

## 2016-06-15 ENCOUNTER — Telehealth: Payer: Self-pay | Admitting: *Deleted

## 2016-06-15 DIAGNOSIS — R609 Edema, unspecified: Secondary | ICD-10-CM

## 2016-06-15 MED ORDER — FUROSEMIDE 40 MG PO TABS: ORAL_TABLET | ORAL | 1 refills | Status: DC

## 2016-06-15 NOTE — Telephone Encounter (Signed)
Furosemide rx renewed as per pt request.

## 2016-06-15 NOTE — Telephone Encounter (Signed)
Fax from [facility] [employer]. Note on fax stating that have given pt 3 tablets no charge.  RF request for furosemide LOV: 05/27/16 Next ov: 09/24/16 Last written: 12/04/14  This medication is not on pts active medication list. It has been prescribed in the past and was d/c on 04/09/16.  Please advise. Thanks.

## 2016-06-23 ENCOUNTER — Telehealth: Payer: Self-pay | Admitting: *Deleted

## 2016-06-23 NOTE — Telephone Encounter (Signed)
Acetaminophen (Tylenol) 1000 mg every 6 hours as needed.

## 2016-06-23 NOTE — Telephone Encounter (Signed)
Pt called stated that she is having really bad headaches across her forehead. She stated that she feels like this is a sinus headache and would like to know what she can take otc. She stated that this has been happening x 1 week.  Please advise. Thanks.

## 2016-06-23 NOTE — Telephone Encounter (Signed)
Left detailed message on cell vm, okay per pt.

## 2016-06-25 ENCOUNTER — Encounter: Payer: Self-pay | Admitting: Adult Health

## 2016-06-25 ENCOUNTER — Ambulatory Visit (INDEPENDENT_AMBULATORY_CARE_PROVIDER_SITE_OTHER): Payer: BC Managed Care – PPO | Admitting: Adult Health

## 2016-06-25 VITALS — BP 138/76 | HR 86 | Ht 63.5 in | Wt 240.0 lb

## 2016-06-25 DIAGNOSIS — G4730 Sleep apnea, unspecified: Secondary | ICD-10-CM | POA: Diagnosis not present

## 2016-06-25 DIAGNOSIS — G4733 Obstructive sleep apnea (adult) (pediatric): Secondary | ICD-10-CM | POA: Diagnosis not present

## 2016-06-25 DIAGNOSIS — G4710 Hypersomnia, unspecified: Secondary | ICD-10-CM

## 2016-06-25 NOTE — Patient Instructions (Signed)
Continue on CPAP At bedtime   Do not drive if sleepy.  CPAP download order to DME sent.  Stay active and work on weight loss.  Follow up Dr. Elsworth Soho  In 1 year and As needed

## 2016-06-25 NOTE — Assessment & Plan Note (Signed)
Mild OSA controlled on CPAP , needs CPAP download  Suspect daily meds contribute to sleepiness.   Plan  Patient Instructions  Continue on CPAP At bedtime   Do not drive if sleepy.  CPAP download order to DME sent.  Stay active and work on weight loss.  Follow up Dr. Elsworth Soho  In 1 year and As needed

## 2016-06-25 NOTE — Assessment & Plan Note (Signed)
Wt loss  
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
## 2016-06-26 ENCOUNTER — Other Ambulatory Visit: Payer: Self-pay | Admitting: Family Medicine

## 2016-06-29 ENCOUNTER — Encounter: Payer: Self-pay | Admitting: Family Medicine

## 2016-06-30 ENCOUNTER — Telehealth: Payer: Self-pay | Admitting: Adult Health

## 2016-06-30 DIAGNOSIS — G4733 Obstructive sleep apnea (adult) (pediatric): Secondary | ICD-10-CM

## 2016-06-30 NOTE — Telephone Encounter (Signed)
Spoke with Bethanne Ginger with [employer], who states order for download has been received. Bethanne Ginger states she will print order and get it over to RT. I have spoken with pt, and made her aware. Will hold message in triage until download is received.

## 2016-06-30 NOTE — Telephone Encounter (Signed)
Sabrina [name] From [facility] returning call and can be reached @[phone].Sabrina [name]

## 2016-06-30 NOTE — Telephone Encounter (Signed)
Patient states that she was advised by [facility] that they did not receive an order from our office for a CPAP download. Pt aware that iwill contact [facility] to find out what is going on. Spoke with Kennedy(answering service) at [place], [place] for return call.  Referral Notes  Number of Notes: 3  Type Date User Summary Attachment  General 06/25/2016 3:12 PM Rhonda J Cobb - -  Note   Confirmation received from fax that [facility] has received referral for cpap download. Carlos Levering Cobb         Type Date User Summary Attachment  General 06/25/2016 2:44 PM Catha Gosselin - -  Note   Referral faxed to [facility] requesting download. Catha Gosselin         Type Date User Summary Attachment   Provider Comments 06/25/2016 11:02 AM Len Blalock, CMA Provider Comments -  Note   DME: [facility] Please send cpap download.  Thanks!

## 2016-07-01 NOTE — Telephone Encounter (Signed)
I have checked TP's look at, triage and up front and this download has not been received. Will continue to wait for download.

## 2016-07-03 NOTE — Telephone Encounter (Signed)
Spoke with pt. She is aware that we will place an order for a new CPAP machine. Order has been placed. Nothing further was needed.

## 2016-07-03 NOTE — Telephone Encounter (Signed)
lmom tcb x1 to pt  spoke with [facility] and they stated that they could not get a download from pts machine due to it being so old and that she will need a new machine

## 2016-07-03 NOTE — Telephone Encounter (Signed)
TP  I spoke with [facility] and they stated that due to this pt's machine being so old they will not be able to obtain a download from it. The only other option is to order pt a new machine.

## 2016-07-03 NOTE — Telephone Encounter (Signed)
Patient returning call - she can be reached at [phone] -pr  °

## 2016-07-03 NOTE — Telephone Encounter (Signed)
That is fine for new machine order same setting

## 2016-07-03 NOTE — Telephone Encounter (Signed)
Spoke with pt. She is aware that we can't obtain a download due to the age of her machine. Pt states that she is fine with having her machine updated to a newer model.  TP - please advise. Thanks.

## 2016-07-06 NOTE — Progress Notes (Signed)
Reviewed & agree with plan  

## 2016-07-15 ENCOUNTER — Telehealth: Payer: Self-pay | Admitting: Adult Health

## 2016-07-15 DIAGNOSIS — G4733 Obstructive sleep apnea (adult) (pediatric): Secondary | ICD-10-CM

## 2016-07-15 NOTE — Telephone Encounter (Signed)
Spoke with pt, states that per [facility] she is required to undergo a new sleep study in order to receive a new cpap machine.   ATC [facility] to see what type of sleep study is needed per insurance, [facility] is closed d/t being after 5:00. wcb tomorrow (07/16/16) to follow up on.

## 2016-07-17 NOTE — Telephone Encounter (Signed)
Called and spoke with [facility] and they stated that the pts sleep study on file if out of date and she will need a new sleep study in order to get the cpap.  TP please advise. thanks

## 2016-07-20 NOTE — Telephone Encounter (Signed)
Per TP: okay for HST.  Thanks.

## 2016-07-20 NOTE — Telephone Encounter (Signed)
lmtcb x1 for pt. 

## 2016-07-20 NOTE — Telephone Encounter (Signed)
Spoke with pt. She is aware that we will be ordering this sleep study. Nothing further was needed.

## 2016-07-29 ENCOUNTER — Encounter: Payer: Self-pay | Admitting: Family Medicine

## 2016-08-03 ENCOUNTER — Encounter: Payer: Self-pay | Admitting: Gastroenterology

## 2016-08-06 DIAGNOSIS — G4733 Obstructive sleep apnea (adult) (pediatric): Secondary | ICD-10-CM | POA: Diagnosis not present

## 2016-08-11 ENCOUNTER — Telehealth: Payer: Self-pay | Admitting: Pulmonary Disease

## 2016-08-11 DIAGNOSIS — G4733 Obstructive sleep apnea (adult) (pediatric): Secondary | ICD-10-CM | POA: Diagnosis not present

## 2016-08-11 NOTE — Telephone Encounter (Signed)
Per RA, HST showed mild OSA with 14 events per hour. OSA is worse when shes sleeping on her left side.

## 2016-08-13 ENCOUNTER — Other Ambulatory Visit: Payer: Self-pay | Admitting: *Deleted

## 2016-08-13 DIAGNOSIS — G4733 Obstructive sleep apnea (adult) (pediatric): Secondary | ICD-10-CM

## 2016-08-13 NOTE — Telephone Encounter (Signed)
Left message for pt to call back  °

## 2016-08-13 NOTE — Telephone Encounter (Signed)
I understand that order was placed in February. Please confirm with [facility]

## 2016-08-13 NOTE — Telephone Encounter (Signed)
Spoke with patient about results. She verbalized understanding. She wants to know if the RX for her new CPAP machine can be placed now.

## 2016-08-13 NOTE — Telephone Encounter (Signed)
Patient returning call - she can be reached at [phone] -pr

## 2016-08-20 ENCOUNTER — Ambulatory Visit: Payer: BC Managed Care – PPO

## 2016-08-20 VITALS — Ht 63.5 in | Wt 243.4 lb

## 2016-08-20 DIAGNOSIS — Z1211 Encounter for screening for malignant neoplasm of colon: Secondary | ICD-10-CM

## 2016-08-24 ENCOUNTER — Other Ambulatory Visit: Payer: Self-pay | Admitting: *Deleted

## 2016-08-24 MED ORDER — METHOCARBAMOL 500 MG PO TABS: 500.0000 mg | ORAL_TABLET | Freq: Four times a day (QID) | ORAL | 5 refills | Status: DC | PRN

## 2016-08-24 NOTE — Telephone Encounter (Signed)
[facility] [place].  RF request for methocarbamol LOV: 05/27/16 Next ov: 09/24/16 Last written: 04/20/16 #60 w/ 3RF  Please advise. Thanks.

## 2016-08-26 ENCOUNTER — Telehealth: Payer: Self-pay

## 2016-08-26 MED ORDER — SUPREP BOWEL PREP KIT 17.5-3.13-1.6 GM/177ML PO SOLN: 1.0000 | Freq: Once | ORAL | 0 refills | Status: AC

## 2016-08-27 ENCOUNTER — Telehealth: Payer: Self-pay | Admitting: *Deleted

## 2016-08-27 NOTE — Telephone Encounter (Signed)
Levada Dy you seen this patient in [place] , the pharmacy sent [place] a fax stating the patient wants a different prep but it looks like you never sent the Suprep kit to the pharmacy

## 2016-08-27 NOTE — Telephone Encounter (Signed)
Called pt.  She picked up prep from pharmacy yesterday! Angela/PV

## 2016-09-02 ENCOUNTER — Ambulatory Visit (AMBULATORY_SURGERY_CENTER): Payer: BC Managed Care – PPO | Admitting: Gastroenterology

## 2016-09-02 ENCOUNTER — Encounter: Payer: Self-pay | Admitting: Gastroenterology

## 2016-09-02 VITALS — BP 98/55 | HR 73 | Temp 98.7°F | Resp 12 | Ht 63.0 in | Wt 243.0 lb

## 2016-09-02 DIAGNOSIS — Z1212 Encounter for screening for malignant neoplasm of rectum: Secondary | ICD-10-CM | POA: Diagnosis not present

## 2016-09-02 DIAGNOSIS — Z800 Family history of malignant neoplasm of digestive organs: Secondary | ICD-10-CM

## 2016-09-02 DIAGNOSIS — K635 Polyp of colon: Secondary | ICD-10-CM

## 2016-09-02 DIAGNOSIS — Z1211 Encounter for screening for malignant neoplasm of colon: Secondary | ICD-10-CM | POA: Diagnosis not present

## 2016-09-02 DIAGNOSIS — D125 Benign neoplasm of sigmoid colon: Secondary | ICD-10-CM

## 2016-09-02 HISTORY — PX: COLONOSCOPY: SHX174

## 2016-09-02 MED ORDER — SODIUM CHLORIDE 0.9 % IV SOLN: 500.0000 mL | INTRAVENOUS | Status: DC

## 2016-09-02 NOTE — Op Note (Signed)
[facility] Patient Name: Sabrina [name] Procedure Date: 09/02/2016 11:13 AM MRN: [id number] Endoscopist: Mauri Pole , MD Age: 56 Referring MD:  Date of Birth: April 27, 1961 Gender: Female Account #: [id number] Procedure:                Colonoscopy Indications:              Colon cancer screening in patient at increased                            risk: Colorectal cancer in brother, Colon cancer                            screening in patient at increased risk: Family                            history of colorectal cancer in multiple 2nd degree                            relatives Medicines:                Monitored Anesthesia Care Procedure:                Pre-Anesthesia Assessment:                           - Prior to the procedure, a History and Physical                            was performed, and patient medications and                            allergies were reviewed. The patient's tolerance of                            previous anesthesia was also reviewed. The risks                            and benefits of the procedure and the sedation                            options and risks were discussed with the patient.                            All questions were answered, and informed consent                            was obtained. Prior Anticoagulants: The patient has                            taken no previous anticoagulant or antiplatelet                            agents. ASA Grade Assessment: III - A patient with  severe systemic disease. After reviewing the risks                            and benefits, the patient was deemed in                            satisfactory condition to undergo the procedure.                           After obtaining informed consent, the colonoscope                            was passed under direct vision. Throughout the                            procedure, the patient's blood pressure, pulse,  and                            oxygen saturations were monitored continuously. The                            Colonoscope was introduced through the anus and                            advanced to the the terminal ileum, with                            identification of the appendiceal orifice and IC                            valve. The colonoscopy was performed without                            difficulty. The patient tolerated the procedure                            well. The quality of the bowel preparation was                            excellent. The terminal ileum, ileocecal valve,                            appendiceal orifice, and rectum were photographed. Scope In: 11:20:18 AM Scope Out: 11:34:34 AM Scope Withdrawal Time: 0 hours 8 minutes 5 seconds  Total Procedure Duration: 0 hours 14 minutes 16 seconds  Findings:                 The perianal and digital rectal examinations were                            normal.                           A 2 mm polyp was found in the sigmoid colon. The  polyp was sessile. The polyp was removed with a                            cold biopsy forceps. Resection and retrieval were                            complete.                           A few small-mouthed diverticula were found in the                            sigmoid colon.                           A scattered area of mild melanosis was found in the                            entire colon.                           Non-bleeding internal hemorrhoids were found during                            retroflexion. The hemorrhoids were small. Complications:            No immediate complications. Estimated Blood Loss:     Estimated blood loss was minimal. Impression:               - One 2 mm polyp in the sigmoid colon, removed with                            a cold biopsy forceps. Resected and retrieved.                           - Diverticulosis in the sigmoid  colon.                           - Melanosis in the colon.                           - Non-bleeding internal hemorrhoids. Recommendation:           - Patient has a contact number available for                            emergencies. The signs and symptoms of potential                            delayed complications were discussed with the                            patient. Return to normal activities tomorrow.                            Written discharge instructions were provided to the  patient.                           - Resume previous diet.                           - Continue present medications.                           - Await pathology results.                           - Repeat colonoscopy in 5 years for surveillance.                           - Return to GI clinic PRN. Mauri Pole, MD 09/02/2016 11:38:14 AM This report has been signed electronically.

## 2016-09-02 NOTE — Progress Notes (Signed)
Called to room to assist during endoscopic procedure.  Patient ID and intended procedure confirmed with present staff. Received instructions for my participation in the procedure from the performing physician.  

## 2016-09-02 NOTE — Patient Instructions (Signed)
YOU HAD AN ENDOSCOPIC PROCEDURE TODAY AT THE [facility]:   Refer to the procedure report that was given to you for any specific questions about what was found during the examination.  If the procedure report does not answer your questions, please call your gastroenterologist to clarify.  If you requested that your care partner not be given the details of your procedure findings, then the procedure report has been included in a sealed envelope for you to review at your convenience later.  YOU SHOULD EXPECT: Some feelings of bloating in the abdomen. Passage of more gas than usual.  Walking can help get rid of the air that was put into your GI tract during the procedure and reduce the bloating. If you had a lower endoscopy (such as a colonoscopy or flexible sigmoidoscopy) you may notice spotting of blood in your stool or on the toilet paper. If you underwent a bowel prep for your procedure, you may not have a normal bowel movement for a few days.  Please Note:  You might notice some irritation and congestion in your nose or some drainage.  This is from the oxygen used during your procedure.  There is no need for concern and it should clear up in a day or so.  SYMPTOMS TO REPORT IMMEDIATELY:   Following lower endoscopy (colonoscopy or flexible sigmoidoscopy):  Excessive amounts of blood in the stool  Significant tenderness or worsening of abdominal pains  Swelling of the abdomen that is new, acute  Fever of 100F or higher   For urgent or emergent issues, a gastroenterologist can be reached at any hour by calling [phone].   DIET:  We do recommend a small meal at first, but then you may proceed to your regular diet.  Drink plenty of fluids but you should avoid alcoholic beverages for 24 hours.  ACTIVITY:  You should plan to take it easy for the rest of today and you should NOT DRIVE or use heavy machinery until tomorrow (because of the sedation medicines used during the test).     FOLLOW UP: Our staff will call the number listed on your records the next business day following your procedure to check on you and address any questions or concerns that you may have regarding the information given to you following your procedure. If we do not reach you, we will leave a message.  However, if you are feeling well and you are not experiencing any problems, there is no need to return our call.  We will assume that you have returned to your regular daily activities without incident.  If any biopsies were taken you will be contacted by phone or by letter within the next 1-3 weeks.  Please call us at [phone] if you have not heard about the biopsies in 3 weeks.    SIGNATURES/CONFIDENTIALITY: You and/or your care partner have signed paperwork which will be entered into your electronic medical record.  These signatures attest to the fact that that the information above on your After Visit Summary has been reviewed and is understood.  Full responsibility of the confidentiality of this discharge information lies with you and/or your care-partner.  Polyp, diverticulosis and hemorrhoid information given 

## 2016-09-02 NOTE — Progress Notes (Signed)
To PACU, vss patent aw report to rn 

## 2016-09-03 ENCOUNTER — Telehealth: Payer: Self-pay

## 2016-09-03 NOTE — Telephone Encounter (Signed)
   Follow up Call-  Call back number 09/02/2016  Post procedure Call Back phone  # [phone]  Permission to leave phone message Yes  Some recent data might be hidden     Left message

## 2016-09-03 NOTE — Telephone Encounter (Signed)
   Follow up Call-  Call back number 09/02/2016  Post procedure Call Back phone  # [phone]  Permission to leave phone message Yes  Some recent data might be hidden     Left message

## 2016-09-04 ENCOUNTER — Encounter: Payer: Self-pay | Admitting: Family Medicine

## 2016-09-07 ENCOUNTER — Encounter: Payer: Self-pay | Admitting: Gastroenterology

## 2016-09-09 ENCOUNTER — Ambulatory Visit: Payer: BC Managed Care – PPO | Admitting: Neurology

## 2016-09-09 ENCOUNTER — Encounter: Payer: Self-pay | Admitting: Family Medicine

## 2016-09-17 ENCOUNTER — Ambulatory Visit (INDEPENDENT_AMBULATORY_CARE_PROVIDER_SITE_OTHER): Payer: BC Managed Care – PPO | Admitting: Family Medicine

## 2016-09-17 ENCOUNTER — Encounter: Payer: Self-pay | Admitting: Family Medicine

## 2016-09-17 VITALS — BP 128/80 | HR 85 | Temp 98.3°F | Resp 16 | Ht 63.5 in | Wt 241.8 lb

## 2016-09-17 DIAGNOSIS — J029 Acute pharyngitis, unspecified: Secondary | ICD-10-CM

## 2016-09-17 LAB — POCT RAPID STREP A (OFFICE): RAPID STREP A SCREEN: NEGATIVE
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
## 2016-09-17 NOTE — Patient Instructions (Signed)
Gargle with salt water multiple times per day.  Try otc chloraseptic spray as needed for sore throat pain.

## 2016-09-19 LAB — CULTURE, GROUP A STREP

## 2016-09-21 ENCOUNTER — Encounter: Payer: Self-pay | Admitting: *Deleted

## 2016-09-24 ENCOUNTER — Ambulatory Visit: Payer: BC Managed Care – PPO | Admitting: Family Medicine
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
## 2016-09-25 ENCOUNTER — Ambulatory Visit (INDEPENDENT_AMBULATORY_CARE_PROVIDER_SITE_OTHER): Payer: BC Managed Care – PPO | Admitting: Family Medicine

## 2016-09-25 ENCOUNTER — Encounter: Payer: Self-pay | Admitting: Family Medicine

## 2016-09-25 VITALS — BP 118/77 | HR 86 | Temp 98.3°F | Resp 16 | Wt 239.0 lb

## 2016-09-25 DIAGNOSIS — I10 Essential (primary) hypertension: Secondary | ICD-10-CM

## 2016-09-25 DIAGNOSIS — E611 Iron deficiency: Secondary | ICD-10-CM | POA: Diagnosis not present

## 2016-09-25 DIAGNOSIS — M8080XA Other osteoporosis with current pathological fracture, unspecified site, initial encounter for fracture: Secondary | ICD-10-CM

## 2016-09-25 DIAGNOSIS — M797 Fibromyalgia: Secondary | ICD-10-CM

## 2016-09-25 DIAGNOSIS — M8000XA Age-related osteoporosis with current pathological fracture, unspecified site, initial encounter for fracture: Secondary | ICD-10-CM | POA: Diagnosis not present

## 2016-09-25 DIAGNOSIS — E2839 Other primary ovarian failure: Secondary | ICD-10-CM | POA: Diagnosis not present

## 2016-09-25 LAB — CBC WITH DIFFERENTIAL/PLATELET
BASOS ABS: 0 {cells}/uL (ref 0–200)
Basophils Relative: 0 %
EOS ABS: 182 {cells}/uL (ref 15–500)
EOS PCT: 2 %
HCT: 42.2 % (ref 35.0–45.0)
Hemoglobin: 13.5 g/dL (ref 11.7–15.5)
LYMPHS ABS: 2730 {cells}/uL (ref 850–3900)
Lymphocytes Relative: 30 %
MCH: 24.9 pg — AB (ref 27.0–33.0)
MCHC: 32 g/dL (ref 32.0–36.0)
MCV: 77.9 fL — AB (ref 80.0–100.0)
MONOS PCT: 9 %
MPV: 9.8 fL (ref 7.5–12.5)
Monocytes Absolute: 819 cells/uL (ref 200–950)
NEUTROS ABS: 5369 {cells}/uL (ref 1500–7800)
Neutrophils Relative %: 59 %
PLATELETS: 342 10*3/uL (ref 140–400)
RBC: 5.42 MIL/uL — ABNORMAL HIGH (ref 3.80–5.10)
RDW: 15.5 % — ABNORMAL HIGH (ref 11.0–15.0)
WBC: 9.1 10*3/uL (ref 3.8–10.8)
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
## 2016-09-26 LAB — BASIC METABOLIC PANEL
BUN: 18 mg/dL (ref 7–25)
CHLORIDE: 100 mmol/L (ref 98–110)
CO2: 22 mmol/L (ref 20–31)
CREATININE: 0.96 mg/dL (ref 0.50–1.05)
Calcium: 9.4 mg/dL (ref 8.6–10.4)
Glucose, Bld: 96 mg/dL (ref 65–99)
POTASSIUM: 3.9 mmol/L (ref 3.5–5.3)
SODIUM: 136 mmol/L (ref 135–146)

## 2016-09-26 LAB — IRON AND TIBC
%SAT: 13 % (ref 11–50)
Iron: 59 ug/dL (ref 45–160)
TIBC: 444 ug/dL (ref 250–450)
UIBC: 385 ug/dL

## 2016-09-26 LAB — FERRITIN: FERRITIN: 24 ng/mL (ref 10–232)

## 2016-09-29 ENCOUNTER — Encounter: Payer: Self-pay | Admitting: *Deleted

## 2016-09-30 ENCOUNTER — Other Ambulatory Visit: Payer: Self-pay | Admitting: *Deleted

## 2016-09-30 MED ORDER — CLONAZEPAM 1 MG PO TABS: 1.0000 mg | ORAL_TABLET | Freq: Two times a day (BID) | ORAL | 5 refills | Status: DC

## 2016-09-30 NOTE — Telephone Encounter (Signed)
[facility].  RF request for clonazepam LOV: 09/25/16 Next ov: None Last written: 02/10/16 360 w/ 5RF

## 2016-10-01 ENCOUNTER — Other Ambulatory Visit: Payer: Self-pay | Admitting: *Deleted

## 2016-10-01 DIAGNOSIS — R609 Edema, unspecified: Secondary | ICD-10-CM

## 2016-10-01 MED ORDER — FUROSEMIDE 40 MG PO TABS: ORAL_TABLET | ORAL | 1 refills | Status: DC

## 2016-10-01 NOTE — Telephone Encounter (Signed)
[facility].  RF request for furosemide LOV: 09/25/16 Next ov: None Last written: 06/15/16 #30 w/ 1RF

## 2016-10-01 NOTE — Telephone Encounter (Signed)
Rx faxed

## 2016-10-02 ENCOUNTER — Ambulatory Visit (INDEPENDENT_AMBULATORY_CARE_PROVIDER_SITE_OTHER)
Admission: RE | Admit: 2016-10-02 | Discharge: 2016-10-02 | Disposition: A | Discharge status: Home / Self Care | Payer: BC Managed Care – PPO | Source: Ambulatory Visit | Attending: Family Medicine | Admitting: Family Medicine

## 2016-10-02 ENCOUNTER — Other Ambulatory Visit: Payer: Self-pay | Admitting: *Deleted

## 2016-10-02 DIAGNOSIS — R609 Edema, unspecified: Secondary | ICD-10-CM

## 2016-10-02 DIAGNOSIS — E2839 Other primary ovarian failure: Secondary | ICD-10-CM | POA: Diagnosis not present

## 2016-10-02 HISTORY — PX: OTHER SURGICAL HISTORY: SHX169

## 2016-10-02 MED ORDER — FUROSEMIDE 40 MG PO TABS: ORAL_TABLET | ORAL | 1 refills | Status: DC

## 2016-10-02 NOTE — Telephone Encounter (Signed)
[facility]. Requesting 90 day supply.   RF request for furosemide LOV: 09/25/16 Next ov: None Last written: 10/01/16 #30 w/ 1RF  SW Dr. Anitra Lauth in regards to sig of this Rx. He stated to change sig to take 1 tablet twice a week as needed for swelling #24 w/ 1RF.

## 2016-10-04 DIAGNOSIS — E2839 Other primary ovarian failure: Secondary | ICD-10-CM | POA: Diagnosis not present

## 2016-10-05 ENCOUNTER — Encounter: Payer: Self-pay | Admitting: Family Medicine

## 2016-10-06 ENCOUNTER — Ambulatory Visit: Payer: BC Managed Care – PPO | Admitting: Neurology

## 2016-10-27 ENCOUNTER — Other Ambulatory Visit: Payer: Self-pay | Admitting: Family Medicine

## 2016-10-27 DIAGNOSIS — R609 Edema, unspecified: Secondary | ICD-10-CM

## 2016-11-24 ENCOUNTER — Encounter: Payer: Self-pay | Admitting: Neurology

## 2016-11-24 ENCOUNTER — Ambulatory Visit (INDEPENDENT_AMBULATORY_CARE_PROVIDER_SITE_OTHER): Payer: BC Managed Care – PPO | Admitting: Neurology

## 2016-11-24 VITALS — BP 134/72 | HR 77 | Ht 63.5 in | Wt 238.0 lb

## 2016-11-24 DIAGNOSIS — M792 Neuralgia and neuritis, unspecified: Secondary | ICD-10-CM | POA: Diagnosis not present

## 2016-11-24 MED ORDER — GABAPENTIN 300 MG PO CAPS: 900.0000 mg | ORAL_CAPSULE | Freq: Three times a day (TID) | ORAL | 11 refills | Status: DC

## 2016-11-24 NOTE — Patient Instructions (Addendum)
1. Continue all your medications 2. Re-try the alpha lipoic acid 3. Refer to Pain Management  You have been referred to [facility].  They will contact you to schedule your initial appointment.  They are located at [address], [place] [address] (Across from [facility])  If you need to contact them, their phone number is [phone]  4. Schedule appointment with Podiatry as well 5. Follow-up on as needed basis, call for any changes
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
## 2016-12-30 ENCOUNTER — Encounter: Payer: Self-pay | Admitting: Family Medicine

## 2017-01-18 ENCOUNTER — Telehealth: Payer: Self-pay | Admitting: *Deleted

## 2017-01-18 ENCOUNTER — Other Ambulatory Visit: Payer: Self-pay | Admitting: Family Medicine

## 2017-01-18 MED ORDER — METHOCARBAMOL 750 MG PO TABS: 750.0000 mg | ORAL_TABLET | Freq: Two times a day (BID) | ORAL | 1 refills | Status: DC | PRN

## 2017-01-18 MED ORDER — CELECOXIB 200 MG PO CAPS: ORAL_CAPSULE | ORAL | 11 refills | Status: DC

## 2017-01-18 MED ORDER — METHOCARBAMOL 750 MG PO TABS: 750.0000 mg | ORAL_TABLET | Freq: Four times a day (QID) | ORAL | 1 refills | Status: DC | PRN

## 2017-01-18 NOTE — Addendum Note (Signed)
Addended by: Onalee Hua on: 01/18/2017 02:00 PM   Modules accepted: Orders

## 2017-01-18 NOTE — Addendum Note (Signed)
Addended by: Onalee Hua on: 01/18/2017 04:57 PM   Modules accepted: Orders

## 2017-01-18 NOTE — Telephone Encounter (Signed)
Received fax from [facility] stating that methocarbamol 500mg  is on backorder. They said that can either switch to 750mg  or a different muscle relaxer. Please advise. Thanks.

## 2017-01-18 NOTE — Telephone Encounter (Signed)
[facility] cancelled, sent with error. New Rx sent with correct sig.

## 2017-01-18 NOTE — Telephone Encounter (Signed)
Please send Rx for Celebrex to [facility]. Patient contacted pharmacy & they said there are no refills available.

## 2017-01-18 NOTE — Telephone Encounter (Signed)
RF request for celebrex LOV: 09/25/16 Next ov: None Last written: 12/05/14 #30 w/ 12RF  Please advise. Thanks.

## 2017-01-18 NOTE — Telephone Encounter (Signed)
Rx sent 

## 2017-01-18 NOTE — Telephone Encounter (Signed)
OK to change to methocarbamol 750mg , 1 bid prn, #180, RF x 1.-thx

## 2017-01-18 NOTE — Telephone Encounter (Signed)
Pt advised, pt stated that she has been taking the generic. Pt requested new Rx be sent. Rx sent.

## 2017-02-15 ENCOUNTER — Telehealth: Payer: Self-pay | Admitting: Pulmonary Disease

## 2017-02-15 DIAGNOSIS — G4733 Obstructive sleep apnea (adult) (pediatric): Secondary | ICD-10-CM

## 2017-02-15 DIAGNOSIS — Z9989 Dependence on other enabling machines and devices: Principal | ICD-10-CM

## 2017-02-15 NOTE — Telephone Encounter (Signed)
Called [facility] and left message to call back. In the meanwhile RA can we order a new CPAP?  Last ov 06/2016

## 2017-02-16 NOTE — Telephone Encounter (Signed)
Order for new CPAP placed.

## 2017-02-16 NOTE — Telephone Encounter (Signed)
OK - CPAP 12 cm

## 2017-02-23 ENCOUNTER — Other Ambulatory Visit: Payer: Self-pay

## 2017-02-23 DIAGNOSIS — M79604 Pain in right leg: Secondary | ICD-10-CM

## 2017-02-23 DIAGNOSIS — M79605 Pain in left leg: Principal | ICD-10-CM

## 2017-02-25 ENCOUNTER — Ambulatory Visit (INDEPENDENT_AMBULATORY_CARE_PROVIDER_SITE_OTHER): Payer: BC Managed Care – PPO

## 2017-02-25 ENCOUNTER — Ambulatory Visit (INDEPENDENT_AMBULATORY_CARE_PROVIDER_SITE_OTHER): Payer: BC Managed Care – PPO | Admitting: Podiatry

## 2017-02-25 ENCOUNTER — Encounter: Payer: Self-pay | Admitting: Podiatry

## 2017-02-25 DIAGNOSIS — B351 Tinea unguium: Secondary | ICD-10-CM | POA: Diagnosis not present

## 2017-02-25 DIAGNOSIS — M79672 Pain in left foot: Secondary | ICD-10-CM | POA: Diagnosis not present

## 2017-02-25 DIAGNOSIS — G629 Polyneuropathy, unspecified: Secondary | ICD-10-CM | POA: Diagnosis not present

## 2017-02-25 DIAGNOSIS — M79671 Pain in right foot: Secondary | ICD-10-CM

## 2017-02-25 LAB — HEPATIC FUNCTION PANEL
AG Ratio: 1.9 (calc) (ref 1.0–2.5)
ALKALINE PHOSPHATASE (APISO): 87 U/L (ref 33–130)
ALT: 17 U/L (ref 6–29)
AST: 25 U/L (ref 10–35)
Albumin: 4.7 g/dL (ref 3.6–5.1)
BILIRUBIN DIRECT: 0.1 mg/dL (ref 0.0–0.2)
BILIRUBIN INDIRECT: 0.4 mg/dL (ref 0.2–1.2)
BILIRUBIN TOTAL: 0.5 mg/dL (ref 0.2–1.2)
Globulin: 2.5 g/dL (calc) (ref 1.9–3.7)
Total Protein: 7.2 g/dL (ref 6.1–8.1)

## 2017-02-25 LAB — CBC WITH DIFFERENTIAL/PLATELET
BASOS ABS: 69 {cells}/uL (ref 0–200)
Basophils Relative: 0.9 %
Eosinophils Absolute: 239 cells/uL (ref 15–500)
Eosinophils Relative: 3.1 %
HEMATOCRIT: 40.1 % (ref 35.0–45.0)
Hemoglobin: 13.2 g/dL (ref 11.7–15.5)
LYMPHS ABS: 2587 {cells}/uL (ref 850–3900)
MCH: 25.1 pg — AB (ref 27.0–33.0)
MCHC: 32.9 g/dL (ref 32.0–36.0)
MCV: 76.4 fL — AB (ref 80.0–100.0)
MPV: 9.8 fL (ref 7.5–12.5)
Monocytes Relative: 7.9 %
NEUTROS PCT: 54.5 %
Neutro Abs: 4197 cells/uL (ref 1500–7800)
PLATELETS: 282 10*3/uL (ref 140–400)
RBC: 5.25 10*6/uL — AB (ref 3.80–5.10)
RDW: 15.5 % — AB (ref 11.0–15.0)
TOTAL LYMPHOCYTE: 33.6 %
WBC: 7.7 10*3/uL (ref 3.8–10.8)
WBCMIX: 608 {cells}/uL (ref 200–950)

## 2017-02-25 MED ORDER — TERBINAFINE HCL 250 MG PO TABS: 250.0000 mg | ORAL_TABLET | Freq: Every day | ORAL | 0 refills | Status: DC

## 2017-02-25 NOTE — Patient Instructions (Signed)
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
## 2017-02-26 ENCOUNTER — Telehealth: Payer: Self-pay | Admitting: *Deleted

## 2017-02-26 NOTE — Telephone Encounter (Addendum)
-----   Message from Trula Slade, DPM sent at 02/26/2017  2:08 PM EDT ----- OK to start lamsil. Please let her know. Thanks. I informed pt of Dr. Leigh Aurora review of results and orders.

## 2017-03-02 ENCOUNTER — Other Ambulatory Visit: Payer: Self-pay | Admitting: *Deleted

## 2017-03-02 MED ORDER — OMEPRAZOLE 40 MG PO CPDR: 40.0000 mg | DELAYED_RELEASE_CAPSULE | Freq: Two times a day (BID) | ORAL | 3 refills | Status: DC

## 2017-03-02 MED ORDER — LOSARTAN POTASSIUM-HCTZ 100-25 MG PO TABS: 1.0000 | ORAL_TABLET | Freq: Every day | ORAL | 3 refills | Status: DC

## 2017-03-02 NOTE — Addendum Note (Signed)
Addended by: Onalee Hua on: 03/02/2017 12:21 PM   Modules accepted: Orders

## 2017-03-03 MED ORDER — OMEPRAZOLE 40 MG PO CPDR: 40.0000 mg | DELAYED_RELEASE_CAPSULE | Freq: Two times a day (BID) | ORAL | 0 refills | Status: DC

## 2017-03-03 MED ORDER — LOSARTAN POTASSIUM-HCTZ 100-25 MG PO TABS: 1.0000 | ORAL_TABLET | Freq: Every day | ORAL | 0 refills | Status: DC

## 2017-03-03 NOTE — Telephone Encounter (Signed)
Pharmacy faxed back requesting Rx's for 90 day supply.

## 2017-03-03 NOTE — Addendum Note (Signed)
Addended by: Onalee Hua on: 03/03/2017 02:01 PM   Modules accepted: Orders

## 2017-03-24 ENCOUNTER — Encounter (HOSPITAL_COMMUNITY): Payer: BC Managed Care – PPO

## 2017-03-24 ENCOUNTER — Encounter: Payer: BC Managed Care – PPO | Admitting: Vascular Surgery

## 2017-03-24 ENCOUNTER — Ambulatory Visit: Payer: Self-pay | Admitting: Orthopedic Surgery

## 2017-04-08 ENCOUNTER — Ambulatory Visit: Payer: BC Managed Care – PPO | Admitting: Podiatry

## 2017-04-15 ENCOUNTER — Ambulatory Visit: Payer: BC Managed Care – PPO | Admitting: Podiatry

## 2017-04-16 ENCOUNTER — Encounter: Payer: Self-pay | Admitting: Family Medicine

## 2017-04-16 ENCOUNTER — Ambulatory Visit: Payer: BC Managed Care – PPO | Admitting: Family Medicine

## 2017-04-16 VITALS — BP 128/79 | HR 76 | Temp 98.4°F | Resp 18 | Wt 233.0 lb

## 2017-04-16 DIAGNOSIS — J069 Acute upper respiratory infection, unspecified: Secondary | ICD-10-CM | POA: Diagnosis not present

## 2017-04-16 DIAGNOSIS — J0110 Acute frontal sinusitis, unspecified: Secondary | ICD-10-CM | POA: Diagnosis not present

## 2017-04-16 MED ORDER — AMOXICILLIN 875 MG PO TABS: 875.0000 mg | ORAL_TABLET | Freq: Two times a day (BID) | ORAL | 0 refills | Status: AC
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
## 2017-04-16 NOTE — Patient Instructions (Signed)
Get otc generic robitussin DM OR Mucinex DM and use as directed on the packaging for cough and congestion. Use otc generic saline nasal spray 2-3 times per day to irrigate/moisturize your nasal passages.   

## 2017-04-20 ENCOUNTER — Other Ambulatory Visit: Payer: Self-pay | Admitting: Family Medicine

## 2017-04-20 NOTE — Telephone Encounter (Signed)
[facility]  RF request for clonazepam LOV: 09/25/16 Next ov: None Last written: 09/30/16 #60 w/ 5RF  Please advise. Thanks.

## 2017-04-20 NOTE — Telephone Encounter (Signed)
Rx faxed

## 2017-04-20 NOTE — Telephone Encounter (Signed)
Pt advised and voiced understanding. Apt made for 04/21/17 at 2:45pm.

## 2017-04-20 NOTE — Telephone Encounter (Signed)
Pls notify pt that I must see her for f/u anxiety and monitoring of clonazepam use every 6 months. This is a recent mandate of a controlled substance law passed in [place]. I'll rx a 30 day supply of clonaz with 1 additional RF. Needs to have office f/u for anxiety/meds in 6-8 weeks.--thx

## 2017-04-21 ENCOUNTER — Encounter: Payer: Self-pay | Admitting: Family Medicine

## 2017-04-21 ENCOUNTER — Ambulatory Visit: Payer: BC Managed Care – PPO | Admitting: Family Medicine

## 2017-04-21 VITALS — BP 143/82 | HR 91 | Temp 99.1°F | Resp 16 | Ht 63.5 in | Wt 229.8 lb

## 2017-04-21 DIAGNOSIS — F411 Generalized anxiety disorder: Secondary | ICD-10-CM

## 2017-04-21 DIAGNOSIS — J0110 Acute frontal sinusitis, unspecified: Secondary | ICD-10-CM

## 2017-04-21 DIAGNOSIS — J209 Acute bronchitis, unspecified: Secondary | ICD-10-CM | POA: Diagnosis not present

## 2017-04-21 DIAGNOSIS — F418 Other specified anxiety disorders: Secondary | ICD-10-CM

## 2017-04-21 MED ORDER — CLONAZEPAM 1 MG PO TABS: ORAL_TABLET | ORAL | 5 refills | Status: DC

## 2017-04-21 MED ORDER — ALBUTEROL SULFATE HFA 108 (90 BASE) MCG/ACT IN AERS: 1.0000 | INHALATION_SPRAY | Freq: Four times a day (QID) | RESPIRATORY_TRACT | 0 refills | Status: DC | PRN

## 2017-04-21 MED ORDER — PREDNISONE 20 MG PO TABS: ORAL_TABLET | ORAL | 0 refills | Status: DC
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
## 2017-04-23 ENCOUNTER — Ambulatory Visit: Payer: BC Managed Care – PPO | Admitting: Podiatry

## 2017-04-28 ENCOUNTER — Ambulatory Visit: Payer: Self-pay

## 2017-04-28 NOTE — Telephone Encounter (Signed)
noted 

## 2017-04-28 NOTE — Telephone Encounter (Signed)
Pt calling with c/o NP cough and head congestion. She states she has fatigue and has a h/o bronchitis in the past. Was seen 12/14 in office and placed on ABX. [name] Pt states she has been using inhaler and finished abx and steroids. Care advice given and appt made for 04/30/17 0845 with Dr. Raoul Pitch.  Reason for Disposition . Cough has been present for > 3 weeks  Answer Assessment - Initial Assessment Questions 1. ONSET: "When did the cough begin?"      2.5 weeks ago 2. SEVERITY: "How bad is the cough today?"      Not worse just not getting better chest feels "tight" 3. RESPIRATORY DISTRESS: "Describe your breathing."      No distress 4. FEVER: "Do you have a fever?" If so, ask: "What is your temperature, how was it measured, and when did it start?"     No fever when gets OOB and sweating and cold  5. HEMOPTYSIS: "Are you coughing up any blood?" If so ask: "How much?" (flecks, streaks, tablespoons, etc.)     no 6. TREATMENT: "What have you done so far to treat the cough?" (e.g., meds, fluids, humidifier)     Inhaler, prednisone and finished amoxicillin 7. CARDIAC HISTORY: "Do you have any history of heart disease?" (e.g., heart attack, congestive heart failure)      no 8. LUNG HISTORY: "Do you have any history of lung disease?"  (e.g., pulmonary embolus, asthma, emphysema)    H/o bronchitis 9. PE RISK FACTORS: "Do you have a history of blood clots?" (or: recent major surgery, recent prolonged travel, bedridden )     no 10. OTHER SYMPTOMS: "Do you have any other symptoms? (e.g., runny nose, wheezing, chest pain)       Gets shaky with activites or wheezing, out of breath just talking 11. PREGNANCY: "Is there any chance you are pregnant?" "When was your last menstrual period?"       no 12. TRAVEL: "Have you traveled out of the country in the last month?" (e.g., travel history, exposures)       no  Protocols used: COUGH - ACUTE NON-PRODUCTIVE-A-AH

## 2017-04-30 ENCOUNTER — Encounter: Payer: Self-pay | Admitting: Family Medicine

## 2017-04-30 ENCOUNTER — Ambulatory Visit: Payer: BC Managed Care – PPO | Admitting: Family Medicine

## 2017-04-30 ENCOUNTER — Ambulatory Visit (HOSPITAL_BASED_OUTPATIENT_CLINIC_OR_DEPARTMENT_OTHER)
Admission: RE | Admit: 2017-04-30 | Discharge: 2017-04-30 | Disposition: A | Discharge status: Home / Self Care | Payer: BC Managed Care – PPO | Source: Ambulatory Visit | Attending: Family Medicine | Admitting: Family Medicine

## 2017-04-30 VITALS — BP 122/70 | HR 71 | Temp 98.0°F | Resp 20 | Wt 230.0 lb

## 2017-04-30 DIAGNOSIS — R5383 Other fatigue: Secondary | ICD-10-CM

## 2017-04-30 DIAGNOSIS — R509 Fever, unspecified: Secondary | ICD-10-CM | POA: Diagnosis not present

## 2017-04-30 DIAGNOSIS — J9811 Atelectasis: Secondary | ICD-10-CM | POA: Insufficient documentation

## 2017-04-30 DIAGNOSIS — J209 Acute bronchitis, unspecified: Secondary | ICD-10-CM

## 2017-04-30 DIAGNOSIS — R059 Cough, unspecified: Secondary | ICD-10-CM

## 2017-04-30 DIAGNOSIS — R918 Other nonspecific abnormal finding of lung field: Secondary | ICD-10-CM | POA: Diagnosis not present

## 2017-04-30 DIAGNOSIS — R05 Cough: Secondary | ICD-10-CM

## 2017-04-30 MED ORDER — BENZONATATE 200 MG PO CAPS: 200.0000 mg | ORAL_CAPSULE | Freq: Two times a day (BID) | ORAL | 0 refills | Status: DC

## 2017-04-30 MED ORDER — AZITHROMYCIN 250 MG PO TABS: ORAL_TABLET | ORAL | 0 refills | Status: DC

## 2017-04-30 NOTE — Patient Instructions (Signed)
Rest, hydrate. You appear dry on exam--> hydrate with more water.  +flonase, mucinex (DM if cough), nettie pot or nasal saline.  z-pack prescribed, take until completed.  If cough present it can last up to 6-8 weeks.  F/U 2 weeks of not improved.   Please get cxr at [facility] today.     Acute Bronchitis, Adult Acute bronchitis is when air tubes (bronchi) in the lungs suddenly get swollen. The condition can make it hard to breathe. It can also cause these symptoms:  A cough.  Coughing up clear, yellow, or green mucus.  Wheezing.  Chest congestion.  Shortness of breath.  A fever.  Body aches.  Chills.  A sore throat.  Follow these instructions at home: Medicines  Take over-the-counter and prescription medicines only as told by your doctor.  If you were prescribed an antibiotic medicine, take it as told by your doctor. Do not stop taking the antibiotic even if you start to feel better. General instructions  Rest.  Drink enough fluids to keep your pee (urine) clear or pale yellow.  Avoid smoking and secondhand smoke. If you smoke and you need help quitting, ask your doctor. Quitting will help your lungs heal faster.  Use an inhaler, cool mist vaporizer, or humidifier as told by your doctor.  Keep all follow-up visits as told by your doctor. This is important. How is this prevented? To lower your risk of getting this condition again:  Wash your hands often with soap and water. If you cannot use soap and water, use hand sanitizer.  Avoid contact with people who have cold symptoms.  Try not to touch your hands to your mouth, nose, or eyes.  Make sure to get the flu shot every year.  Contact a doctor if:  Your symptoms do not get better in 2 weeks. Get help right away if:  You cough up blood.  You have chest pain.  You have very bad shortness of breath.  You become dehydrated.  You faint (pass out) or keep feeling like you are going to pass  out.  You keep throwing up (vomiting).  You have a very bad headache.  Your fever or chills gets worse. This information is not intended to replace advice given to you by your health care provider. Make sure you discuss any questions you have with your health care provider. Document Released: 10/07/2007 Document Revised: 11/27/2015 Document Reviewed: 10/09/2015 Elsevier Interactive Patient Education  [employer].
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
## 2017-05-06 ENCOUNTER — Ambulatory Visit: Payer: BC Managed Care – PPO | Admitting: Podiatry

## 2017-05-13 NOTE — Pre-Procedure Instructions (Signed)
Sabrina [name]  05/13/2017      [id number] - [place], [place] - [address] [place] [address] Phone: [phone] Fax: [phone]    Your procedure is scheduled on May 20, 2017.  Report to [facility] Admitting at 1100 AM.  Call this number if you have problems the morning of surgery:  [phone]   Remember:  Do not eat food or drink liquids after midnight.  Take these medicines the morning of surgery with A SIP OF WATER acetaminophen (tylenol), albuterol inhaler (bring inhaler with you), amitriptyline (elavil), clonazepam (klonopin)-if needed, gabapentin (neurontin), methocarbamol (robaxin)-if needed for muscle spasms, oxycodone-if needed for pain, sertraline (zoloft).  Beginning now, STOP taking any celebrex, Aspirin (unless otherwise instructed by your surgeon), Aleve, Naproxen, Ibuprofen, Motrin, Advil, Goody's, BC's, all herbal medications, fish oil, and all vitamins  Continue all other medications as instructed by your physician except follow the above medication instructions before surgery   Do not wear jewelry, make-up or nail polish.  Do not wear lotions, powders, or perfumes, or deodorant.  Do not shave 48 hours prior to surgery.    Do not bring valuables to the hospital.  [facility] is not responsible for any belongings or valuables.  Contacts, dentures or bridgework may not be worn into surgery.  Leave your suitcase in the car.  After surgery it may be brought to your room.  For patients admitted to the hospital, discharge time will be determined by your treatment team.  Patients discharged the day of surgery will not be allowed to drive home.   Special instructions:   [place]- Preparing For Surgery  Before surgery, you can play an important role. Because skin is not sterile, your skin needs to be as free of germs as possible. You can reduce the number of germs on  your skin by washing with CHG (chlorahexidine gluconate) Soap before surgery.  CHG is an antiseptic cleaner which kills germs and bonds with the skin to continue killing germs even after washing.  Please do not use if you have an allergy to CHG or antibacterial soaps. If your skin becomes reddened/irritated stop using the CHG.  Do not shave (including legs and underarms) for at least 48 hours prior to first CHG shower. It is OK to shave your face.  Please follow these instructions carefully.   1. Shower the NIGHT BEFORE SURGERY and the MORNING OF SURGERY with CHG.   2. If you chose to wash your hair, wash your hair first as usual with your normal shampoo.  3. After you shampoo, rinse your hair and body thoroughly to remove the shampoo.  4. Use CHG as you would any other liquid soap. You can apply CHG directly to the skin and wash gently with a scrungie or a clean washcloth.   5. Apply the CHG Soap to your body ONLY FROM THE NECK DOWN.  Do not use on open wounds or open sores. Avoid contact with your eyes, ears, mouth and genitals (private parts). Wash Face and genitals (private parts)  with your normal soap.  6. Wash thoroughly, paying special attention to the area where your surgery will be performed.  7. Thoroughly rinse your body with warm water from the neck down.  8. DO NOT shower/wash with your normal soap after using and rinsing off the CHG Soap.  9. Pat yourself dry with a CLEAN TOWEL.  10.  Wear CLEAN PAJAMAS to bed the night before surgery, wear comfortable clothes the morning of surgery  11. Place CLEAN SHEETS on your bed the night of your first shower and DO NOT SLEEP WITH PETS.  Day of Surgery: Do not apply any deodorants/lotions. Please wear clean clothes to the hospital/surgery center.    Please read over the following fact sheets that you were given. Pain Booklet, Coughing and Deep Breathing, MRSA Information and Surgical Site Infection Prevention

## 2017-05-14 ENCOUNTER — Encounter: Payer: Self-pay | Admitting: Podiatry

## 2017-05-14 ENCOUNTER — Encounter (HOSPITAL_COMMUNITY): Payer: Self-pay

## 2017-05-14 ENCOUNTER — Other Ambulatory Visit: Payer: Self-pay

## 2017-05-14 ENCOUNTER — Ambulatory Visit: Payer: BC Managed Care – PPO | Admitting: Podiatry

## 2017-05-14 ENCOUNTER — Encounter (HOSPITAL_COMMUNITY)
Admission: RE | Admit: 2017-05-14 | Discharge: 2017-05-14 | Disposition: A | Discharge status: Home / Self Care | Payer: BC Managed Care – PPO | Source: Ambulatory Visit | Attending: Orthopedic Surgery | Admitting: Orthopedic Surgery

## 2017-05-14 DIAGNOSIS — I10 Essential (primary) hypertension: Secondary | ICD-10-CM | POA: Insufficient documentation

## 2017-05-14 DIAGNOSIS — B351 Tinea unguium: Secondary | ICD-10-CM | POA: Diagnosis not present

## 2017-05-14 DIAGNOSIS — Z01810 Encounter for preprocedural cardiovascular examination: Secondary | ICD-10-CM | POA: Insufficient documentation

## 2017-05-14 DIAGNOSIS — Z01812 Encounter for preprocedural laboratory examination: Secondary | ICD-10-CM | POA: Insufficient documentation

## 2017-05-14 DIAGNOSIS — G629 Polyneuropathy, unspecified: Secondary | ICD-10-CM | POA: Diagnosis not present

## 2017-05-14 LAB — SURGICAL PCR SCREEN
MRSA, PCR: NEGATIVE
STAPHYLOCOCCUS AUREUS: NEGATIVE

## 2017-05-14 LAB — BASIC METABOLIC PANEL
ANION GAP: 10 (ref 5–15)
BUN: 14 mg/dL (ref 6–20)
CALCIUM: 9.6 mg/dL (ref 8.9–10.3)
CO2: 24 mmol/L (ref 22–32)
Chloride: 104 mmol/L (ref 101–111)
Creatinine, Ser: 1.02 mg/dL — ABNORMAL HIGH (ref 0.44–1.00)
GLUCOSE: 95 mg/dL (ref 65–99)
POTASSIUM: 4.2 mmol/L (ref 3.5–5.1)
Sodium: 138 mmol/L (ref 135–145)

## 2017-05-14 LAB — CBC
HEMATOCRIT: 41.1 % (ref 36.0–46.0)
HEMOGLOBIN: 13 g/dL (ref 12.0–15.0)
MCH: 25.3 pg — ABNORMAL LOW (ref 26.0–34.0)
MCHC: 31.6 g/dL (ref 30.0–36.0)
MCV: 80.1 fL (ref 78.0–100.0)
Platelets: 215 10*3/uL (ref 150–400)
RBC: 5.13 MIL/uL — AB (ref 3.87–5.11)
RDW: 15.3 % (ref 11.5–15.5)
WBC: 7.4 10*3/uL (ref 4.0–10.5)

## 2017-05-14 NOTE — Progress Notes (Addendum)
PCP is Dr.Philip McGowen Cardiologist is Dr. Mare Ferrari Echo 03-18-14 Stress test 03-15-14 Card cath-Denies  Instructed to bring CPAP mask on the day of surgery.

## 2017-05-17 ENCOUNTER — Other Ambulatory Visit: Payer: Self-pay | Admitting: Family Medicine

## 2017-05-17 NOTE — Progress Notes (Signed)
Anesthesia Chart Review:  Pt is 57 year old female scheduled for L5-S1 PLIF on 01/15/2015 with Dr. Ellene Route.   - PCP is Shawnie Dapper, MD - Saw Dr. Mare Ferrari with cardiology 02/2014 for chest tightness. Echo and stress test 03/08/14 "good" per Dr. Mare Ferrari. Pt does has not seen cardiology since.   PMH includes: HTN, hyperlipidemia, OSA, metabolic syndrome, anemia, cervical cancer, post-op N/V, GERD. Never smoker. BMI 41. S/p PLIF 01/15/15. S/p hardware removal R wrist 04/21/16.   Medications include: Albuterol, Lasix, losartan-hctz, prilosec.   VS 05/14/17:  BP 136/66.  HR 85.  RR 20.  Temp 36.8  Preoperative labs reviewed.    CXR 04/30/17: Low lung volumes with mild basilar atelectasis . No acute infiltrate.    EKG 05/14/17: NSR   Echo 03/08/2014:  - Left ventricle: The cavity size was normal. There was mild concentric hypertrophy. Systolic function was normal. The estimated ejection fraction was in the range of 60% to 65%. Wall motion was normal; there were no regional wall motion abnormalities. - Mitral valve: There was trivial regurgitation. - Atrial septum: There was increased thickness of the septum, consistent with lipomatous hypertrophy. - Tricuspid valve: There was trivial regurgitation.  Nuclear stress test 03/08/2014:  -Low risk stress nuclear study with no definite perfusion defect. However, the TID ratio is visually elevated and measures 1.33. Need clinical correlation. Balanced ischemia is a concern, albeit unlikely. LV Ejection Fraction: 69%. LV Wall Motion: NL LV Function; NL Wall Motion.   If no changes, I anticipate pt can proceed with surgery as scheduled.   Willeen Cass, FNP-BC [facility]/Anesthesiology Phone: [phone] 05/17/2017 4:13 PM

## 2017-05-17 NOTE — Progress Notes (Signed)
Subjective: Sabrina [name] presents to the office today for follow-up evaluation of nail fungus.  She states that she is almost complete the Lamisil and she states that she is having no side effects the medication doing well.  She has noticed some mild clearing of the nails.  Denies any pain in the nails and she denies any redness or drainage or any swelling.  In regards to neuropathy she was being seen at pain management for this however she has tried numerous oral medications without any significant improvement and she states that her feet still feel like they are on fire.  She is asked if there is any types of injections or any other treatments that can be done for neuropathy.  She denies any recent injury since last appointment she has no other concerns. Denies any systemic complaints such as fevers, chills, nausea, vomiting. No acute changes since last appointment, and no other complaints at this time.   Objective: AAO x3, NAD DP/PT pulses palpable bilaterally, CRT less than 3 seconds Toenails do appear to be somewhat dystrophic with yellow to brown discoloration mostly to the hallux and second digit toenails bilaterally.  There is no pain in the nails there is no surrounding redness or drainage.  There does appear to be mild clearing along the proximal nail borders.  There is no area of tenderness identified bilaterally and there is no overlying edema, erythema, increase in warmth. No open lesions or pre-ulcerative lesions.  No pain with calf compression, swelling, warmth, erythema  Assessment: Onychomycosis, symptomatic neuropathy  Plan: -All treatment options discussed with the patient including all alternatives, risks, complications.  -Regards to nail fungus on her to continue and finish the course of Lamisil.  She has no side effects so far however I want her to continue to monitor closely for this. -In regards to neuropathy she is asked about other treatment options.  I will put a referral in for  [facility] pain management as she states that she does not want to keep going the same pain management doctor and keep getting different oral medications. -Patient encouraged to call the office with any questions, concerns, change in symptoms.  -Follow-up in 3 months or sooner if needed.  She agrees this plan has no further questions or concerns  Trula Slade DPM

## 2017-05-18 ENCOUNTER — Telehealth: Payer: Self-pay | Admitting: Family Medicine

## 2017-05-18 ENCOUNTER — Telehealth: Payer: Self-pay | Admitting: *Deleted

## 2017-05-18 DIAGNOSIS — G629 Polyneuropathy, unspecified: Secondary | ICD-10-CM

## 2017-05-18 NOTE — Telephone Encounter (Signed)
-----   Message from Trula Slade, DPM sent at 05/17/2017  6:39 PM EST ----- Can you please put in for pain management consult account for possible injections or other therapy for neuropathy?

## 2017-05-18 NOTE — Telephone Encounter (Signed)
Faxed required form, referral, all clinicals to date, and demographics to [facility].

## 2017-05-18 NOTE — Telephone Encounter (Signed)
Copied from [place] [id number]. Topic: Quick Communication - Rx Refill/Question >> May 18, 2017  4:08 PM Oliver Pila B wrote: Medication:  clonazePAM (KLONOPIN) 1 MG tablet [[id number]]  Pt wanted to clarify if she needed to take Rx as directed or if she should go up to 3x a day

## 2017-05-19 NOTE — Telephone Encounter (Signed)
SW pt, she wanted to make sure that Dr. Anitra Lauth did agree to increase her clonazepam to 1 tab TID PRN. I reviewed o/v note from 04/21/17 and Dr. Anitra Lauth did agree to this increase. Pt advised and voiced understanding.

## 2017-05-20 ENCOUNTER — Encounter (HOSPITAL_COMMUNITY)
Admission: RE | Disposition: A | Discharge status: Home / Self Care | Payer: Self-pay | Source: Ambulatory Visit | Attending: Orthopedic Surgery

## 2017-05-20 ENCOUNTER — Ambulatory Visit (HOSPITAL_COMMUNITY): Payer: BC Managed Care – PPO | Admitting: Emergency Medicine

## 2017-05-20 ENCOUNTER — Encounter (HOSPITAL_COMMUNITY): Payer: Self-pay

## 2017-05-20 ENCOUNTER — Observation Stay (HOSPITAL_COMMUNITY)
Admission: RE | Admit: 2017-05-20 | Discharge: 2017-05-22 | Disposition: A | Discharge status: Home / Self Care | Payer: BC Managed Care – PPO | Source: Ambulatory Visit | Attending: Orthopedic Surgery | Admitting: Orthopedic Surgery

## 2017-05-20 ENCOUNTER — Other Ambulatory Visit: Payer: Self-pay

## 2017-05-20 DIAGNOSIS — T8484XA Pain due to internal orthopedic prosthetic devices, implants and grafts, initial encounter: Secondary | ICD-10-CM | POA: Insufficient documentation

## 2017-05-20 DIAGNOSIS — K219 Gastro-esophageal reflux disease without esophagitis: Secondary | ICD-10-CM | POA: Diagnosis not present

## 2017-05-20 DIAGNOSIS — Z23 Encounter for immunization: Secondary | ICD-10-CM | POA: Diagnosis not present

## 2017-05-20 DIAGNOSIS — M797 Fibromyalgia: Secondary | ICD-10-CM | POA: Insufficient documentation

## 2017-05-20 DIAGNOSIS — R400 Somnolence: Secondary | ICD-10-CM | POA: Diagnosis not present

## 2017-05-20 DIAGNOSIS — Z79899 Other long term (current) drug therapy: Secondary | ICD-10-CM | POA: Insufficient documentation

## 2017-05-20 DIAGNOSIS — F419 Anxiety disorder, unspecified: Secondary | ICD-10-CM | POA: Insufficient documentation

## 2017-05-20 DIAGNOSIS — E785 Hyperlipidemia, unspecified: Secondary | ICD-10-CM | POA: Diagnosis not present

## 2017-05-20 DIAGNOSIS — E8889 Other specified metabolic disorders: Secondary | ICD-10-CM | POA: Insufficient documentation

## 2017-05-20 DIAGNOSIS — Y793 Surgical instruments, materials and orthopedic devices (including sutures) associated with adverse incidents: Secondary | ICD-10-CM | POA: Diagnosis not present

## 2017-05-20 DIAGNOSIS — M19031 Primary osteoarthritis, right wrist: Secondary | ICD-10-CM | POA: Diagnosis present

## 2017-05-20 DIAGNOSIS — Z8541 Personal history of malignant neoplasm of cervix uteri: Secondary | ICD-10-CM | POA: Insufficient documentation

## 2017-05-20 DIAGNOSIS — G629 Polyneuropathy, unspecified: Secondary | ICD-10-CM | POA: Insufficient documentation

## 2017-05-20 DIAGNOSIS — Z6841 Body Mass Index (BMI) 40.0 and over, adult: Secondary | ICD-10-CM | POA: Diagnosis not present

## 2017-05-20 DIAGNOSIS — I10 Essential (primary) hypertension: Secondary | ICD-10-CM | POA: Diagnosis not present

## 2017-05-20 DIAGNOSIS — Z885 Allergy status to narcotic agent status: Secondary | ICD-10-CM | POA: Insufficient documentation

## 2017-05-20 DIAGNOSIS — Z8249 Family history of ischemic heart disease and other diseases of the circulatory system: Secondary | ICD-10-CM | POA: Insufficient documentation

## 2017-05-20 DIAGNOSIS — F329 Major depressive disorder, single episode, unspecified: Secondary | ICD-10-CM | POA: Insufficient documentation

## 2017-05-20 DIAGNOSIS — J449 Chronic obstructive pulmonary disease, unspecified: Secondary | ICD-10-CM | POA: Insufficient documentation

## 2017-05-20 DIAGNOSIS — G4733 Obstructive sleep apnea (adult) (pediatric): Secondary | ICD-10-CM | POA: Diagnosis not present

## 2017-05-20 DIAGNOSIS — E039 Hypothyroidism, unspecified: Secondary | ICD-10-CM | POA: Diagnosis not present

## 2017-05-20 DIAGNOSIS — G8928 Other chronic postprocedural pain: Secondary | ICD-10-CM | POA: Insufficient documentation

## 2017-05-20 DIAGNOSIS — Z79891 Long term (current) use of opiate analgesic: Secondary | ICD-10-CM | POA: Insufficient documentation

## 2017-05-20 DIAGNOSIS — R5382 Chronic fatigue, unspecified: Secondary | ICD-10-CM | POA: Insufficient documentation

## 2017-05-20 DIAGNOSIS — G894 Chronic pain syndrome: Secondary | ICD-10-CM | POA: Diagnosis not present

## 2017-05-20 DIAGNOSIS — Z9989 Dependence on other enabling machines and devices: Secondary | ICD-10-CM | POA: Insufficient documentation

## 2017-05-20 DIAGNOSIS — S63091A Other subluxation of right wrist and hand, initial encounter: Secondary | ICD-10-CM | POA: Insufficient documentation

## 2017-05-20 HISTORY — PX: WRIST FUSION WITH ILIAC CREST BONE GRAFT: SHX5682

## 2017-05-20 SURGERY — WRIST FUSION WITH ILIAC CREST BONE GRAFT
Anesthesia: General | Site: Wrist | Laterality: Right

## 2017-05-20 MED ORDER — VITAMIN C 500 MG PO TABS
1000.0000 mg | ORAL_TABLET | Freq: Every day | ORAL | Status: DC
  Administered 2017-05-21 – 2017-05-22 (×2): 1000 mg via ORAL
  Filled 2017-05-20 (×2): qty 2

## 2017-05-20 MED ORDER — PROMETHAZINE HCL 25 MG/ML IJ SOLN: 6.2500 mg | INTRAMUSCULAR | Status: DC | PRN

## 2017-05-20 MED ORDER — FLUTICASONE PROPIONATE 50 MCG/ACT NA SUSP
2.0000 | Freq: Every day | NASAL | Status: DC
  Administered 2017-05-20 – 2017-05-21 (×2): 2 via NASAL
  Filled 2017-05-20: qty 16

## 2017-05-20 MED ORDER — ONDANSETRON HCL 4 MG/2ML IJ SOLN
INTRAMUSCULAR | Status: DC | PRN
  Administered 2017-05-20: 4 mg via INTRAVENOUS

## 2017-05-20 MED ORDER — ONDANSETRON HCL 4 MG/2ML IJ SOLN: 4.0000 mg | Freq: Four times a day (QID) | INTRAMUSCULAR | Status: DC | PRN

## 2017-05-20 MED ORDER — CEFAZOLIN SODIUM-DEXTROSE 1-4 GM/50ML-% IV SOLN
1.0000 g | INTRAVENOUS | Status: AC
  Administered 2017-05-20: 1 g via INTRAVENOUS
  Filled 2017-05-20: qty 50

## 2017-05-20 MED ORDER — SODIUM CHLORIDE 0.45 % IV SOLN
INTRAVENOUS | Status: DC
  Administered 2017-05-20: 21:00:00 via INTRAVENOUS

## 2017-05-20 MED ORDER — ONDANSETRON HCL 4 MG PO TABS: 4.0000 mg | ORAL_TABLET | Freq: Four times a day (QID) | ORAL | Status: DC | PRN

## 2017-05-20 MED ORDER — PANTOPRAZOLE SODIUM 40 MG PO TBEC
40.0000 mg | DELAYED_RELEASE_TABLET | Freq: Two times a day (BID) | ORAL | Status: DC
  Administered 2017-05-20 – 2017-05-22 (×4): 40 mg via ORAL
  Filled 2017-05-20 (×4): qty 1

## 2017-05-20 MED ORDER — HYDROCHLOROTHIAZIDE 25 MG PO TABS
25.0000 mg | ORAL_TABLET | Freq: Every day | ORAL | Status: DC
  Administered 2017-05-21 – 2017-05-22 (×2): 25 mg via ORAL
  Filled 2017-05-20 (×2): qty 1

## 2017-05-20 MED ORDER — ONDANSETRON HCL 4 MG/2ML IJ SOLN
INTRAMUSCULAR | Status: AC
  Filled 2017-05-20: qty 2

## 2017-05-20 MED ORDER — HYDROMORPHONE HCL 1 MG/ML IJ SOLN
0.2500 mg | INTRAMUSCULAR | Status: DC | PRN
  Administered 2017-05-20 (×2): 0.5 mg via INTRAVENOUS

## 2017-05-20 MED ORDER — LIDOCAINE 2% (20 MG/ML) 5 ML SYRINGE
INTRAMUSCULAR | Status: DC | PRN
  Administered 2017-05-20: 40 mg via INTRAVENOUS

## 2017-05-20 MED ORDER — TERBINAFINE HCL 250 MG PO TABS
250.0000 mg | ORAL_TABLET | Freq: Every day | ORAL | Status: DC
  Administered 2017-05-21 – 2017-05-22 (×2): 250 mg via ORAL
  Filled 2017-05-20 (×2): qty 1

## 2017-05-20 MED ORDER — OXYCODONE HCL 5 MG PO TABS
ORAL_TABLET | ORAL | Status: AC
  Filled 2017-05-20: qty 2

## 2017-05-20 MED ORDER — MIDAZOLAM HCL 5 MG/5ML IJ SOLN
INTRAMUSCULAR | Status: DC | PRN
  Administered 2017-05-20: 2 mg via INTRAVENOUS

## 2017-05-20 MED ORDER — SUGAMMADEX SODIUM 200 MG/2ML IV SOLN
INTRAVENOUS | Status: AC
  Filled 2017-05-20: qty 2

## 2017-05-20 MED ORDER — GABAPENTIN 300 MG PO CAPS
300.0000 mg | ORAL_CAPSULE | Freq: Three times a day (TID) | ORAL | Status: DC
  Administered 2017-05-20 – 2017-05-22 (×5): 300 mg via ORAL
  Filled 2017-05-20 (×5): qty 1

## 2017-05-20 MED ORDER — MIDAZOLAM HCL 2 MG/2ML IJ SOLN: 0.5000 mg | Freq: Once | INTRAMUSCULAR | Status: DC | PRN

## 2017-05-20 MED ORDER — CEFAZOLIN SODIUM-DEXTROSE 1-4 GM/50ML-% IV SOLN
1.0000 g | Freq: Three times a day (TID) | INTRAVENOUS | Status: DC
  Administered 2017-05-21 – 2017-05-22 (×5): 1 g via INTRAVENOUS
  Filled 2017-05-20 (×8): qty 50

## 2017-05-20 MED ORDER — LOSARTAN POTASSIUM-HCTZ 100-25 MG PO TABS: 1.0000 | ORAL_TABLET | Freq: Every day | ORAL | Status: DC

## 2017-05-20 MED ORDER — METHOCARBAMOL 500 MG PO TABS
ORAL_TABLET | ORAL | Status: AC
  Administered 2017-05-20: 17:00:00
  Filled 2017-05-20: qty 1

## 2017-05-20 MED ORDER — HYDROMORPHONE HCL 1 MG/ML IJ SOLN
0.5000 mg | INTRAMUSCULAR | Status: DC | PRN
Start: 2017-05-20 — End: 2017-05-21
  Administered 2017-05-20 – 2017-05-21 (×5): 1 mg via INTRAVENOUS
  Filled 2017-05-20 (×5): qty 1

## 2017-05-20 MED ORDER — ROCURONIUM BROMIDE 10 MG/ML (PF) SYRINGE
PREFILLED_SYRINGE | INTRAVENOUS | Status: AC
  Filled 2017-05-20: qty 5

## 2017-05-20 MED ORDER — POVIDONE-IODINE 10 % EX SWAB: 2.0000 "application " | Freq: Once | CUTANEOUS | Status: DC

## 2017-05-20 MED ORDER — FENTANYL CITRATE (PF) 250 MCG/5ML IJ SOLN
INTRAMUSCULAR | Status: AC
  Filled 2017-05-20: qty 5

## 2017-05-20 MED ORDER — 0.9 % SODIUM CHLORIDE (POUR BTL) OPTIME
TOPICAL | Status: DC | PRN
  Administered 2017-05-20: 1000 mL

## 2017-05-20 MED ORDER — CEFAZOLIN SODIUM-DEXTROSE 2-4 GM/100ML-% IV SOLN
2.0000 g | INTRAVENOUS | Status: AC
  Administered 2017-05-20: 2 g via INTRAVENOUS
  Filled 2017-05-20: qty 100

## 2017-05-20 MED ORDER — ACETAMINOPHEN 10 MG/ML IV SOLN
INTRAVENOUS | Status: AC
  Filled 2017-05-20: qty 100

## 2017-05-20 MED ORDER — PROPOFOL 10 MG/ML IV BOLUS
INTRAVENOUS | Status: DC | PRN
  Administered 2017-05-20: 200 mg via INTRAVENOUS

## 2017-05-20 MED ORDER — SENNA 8.6 MG PO TABS
1.0000 | ORAL_TABLET | Freq: Two times a day (BID) | ORAL | Status: DC
  Administered 2017-05-20 – 2017-05-22 (×4): 8.6 mg via ORAL
  Filled 2017-05-20 (×6): qty 1

## 2017-05-20 MED ORDER — METHOCARBAMOL 500 MG PO TABS
500.0000 mg | ORAL_TABLET | Freq: Four times a day (QID) | ORAL | Status: DC | PRN
  Administered 2017-05-20 – 2017-05-21 (×2): 500 mg via ORAL
  Filled 2017-05-20 (×2): qty 1

## 2017-05-20 MED ORDER — MIDAZOLAM HCL 2 MG/2ML IJ SOLN
INTRAMUSCULAR | Status: AC
  Filled 2017-05-20: qty 2

## 2017-05-20 MED ORDER — LOSARTAN POTASSIUM 50 MG PO TABS
100.0000 mg | ORAL_TABLET | Freq: Every day | ORAL | Status: DC
  Administered 2017-05-21 – 2017-05-22 (×2): 100 mg via ORAL
  Filled 2017-05-20 (×2): qty 2

## 2017-05-20 MED ORDER — SUGAMMADEX SODIUM 200 MG/2ML IV SOLN
INTRAVENOUS | Status: DC | PRN
  Administered 2017-05-20: 200 mg via INTRAVENOUS

## 2017-05-20 MED ORDER — SERTRALINE HCL 100 MG PO TABS
200.0000 mg | ORAL_TABLET | Freq: Every day | ORAL | Status: DC
  Administered 2017-05-20 – 2017-05-21 (×2): 200 mg via ORAL
  Filled 2017-05-20 (×2): qty 2

## 2017-05-20 MED ORDER — MEPERIDINE HCL 25 MG/ML IJ SOLN: 6.2500 mg | INTRAMUSCULAR | Status: DC | PRN

## 2017-05-20 MED ORDER — DEXAMETHASONE SODIUM PHOSPHATE 4 MG/ML IJ SOLN
INTRAMUSCULAR | Status: DC | PRN
  Administered 2017-05-20: 8 mg via INTRAVENOUS

## 2017-05-20 MED ORDER — INFLUENZA VAC SPLIT QUAD 0.5 ML IM SUSY
0.5000 mL | PREFILLED_SYRINGE | INTRAMUSCULAR | Status: AC
  Administered 2017-05-21: 0.5 mL via INTRAMUSCULAR
  Filled 2017-05-20: qty 0.5

## 2017-05-20 MED ORDER — CHLORHEXIDINE GLUCONATE 4 % EX LIQD: 60.0000 mL | Freq: Once | CUTANEOUS | Status: DC

## 2017-05-20 MED ORDER — PHENYLEPHRINE 40 MCG/ML (10ML) SYRINGE FOR IV PUSH (FOR BLOOD PRESSURE SUPPORT)
PREFILLED_SYRINGE | INTRAVENOUS | Status: DC | PRN
  Administered 2017-05-20 (×2): 80 ug via INTRAVENOUS
  Administered 2017-05-20: 120 ug via INTRAVENOUS
  Administered 2017-05-20: 80 ug via INTRAVENOUS
  Administered 2017-05-20: 120 ug via INTRAVENOUS

## 2017-05-20 MED ORDER — SCOPOLAMINE 1 MG/3DAYS TD PT72
MEDICATED_PATCH | TRANSDERMAL | Status: AC
  Filled 2017-05-20: qty 1

## 2017-05-20 MED ORDER — DEXAMETHASONE SODIUM PHOSPHATE 10 MG/ML IJ SOLN
INTRAMUSCULAR | Status: AC
  Filled 2017-05-20: qty 1

## 2017-05-20 MED ORDER — LACTATED RINGERS IV SOLN
INTRAVENOUS | Status: DC | PRN
  Administered 2017-05-20 (×2): via INTRAVENOUS

## 2017-05-20 MED ORDER — LACTATED RINGERS IV SOLN
INTRAVENOUS | Status: DC
  Administered 2017-05-20: 12:00:00 via INTRAVENOUS

## 2017-05-20 MED ORDER — ACETAMINOPHEN 10 MG/ML IV SOLN
INTRAVENOUS | Status: DC | PRN
  Administered 2017-05-20: 1000 mg via INTRAVENOUS

## 2017-05-20 MED ORDER — PROPOFOL 10 MG/ML IV BOLUS
INTRAVENOUS | Status: AC
  Filled 2017-05-20: qty 20

## 2017-05-20 MED ORDER — CLONAZEPAM 1 MG PO TABS
1.0000 mg | ORAL_TABLET | Freq: Two times a day (BID) | ORAL | Status: DC
  Administered 2017-05-20 – 2017-05-22 (×4): 1 mg via ORAL
  Filled 2017-05-20 (×4): qty 1

## 2017-05-20 MED ORDER — AMITRIPTYLINE HCL 10 MG PO TABS
10.0000 mg | ORAL_TABLET | Freq: Every day | ORAL | Status: DC
  Administered 2017-05-20 – 2017-05-21 (×2): 10 mg via ORAL
  Filled 2017-05-20 (×3): qty 1

## 2017-05-20 MED ORDER — DIPHENHYDRAMINE HCL 25 MG PO CAPS
25.0000 mg | ORAL_CAPSULE | ORAL | Status: DC | PRN
  Administered 2017-05-20 – 2017-05-22 (×6): 25 mg via ORAL
  Filled 2017-05-20 (×6): qty 1

## 2017-05-20 MED ORDER — ADULT MULTIVITAMIN W/MINERALS CH
1.0000 | ORAL_TABLET | Freq: Every day | ORAL | Status: DC
  Administered 2017-05-21 – 2017-05-22 (×2): 1 via ORAL
  Filled 2017-05-20 (×2): qty 1

## 2017-05-20 MED ORDER — ROCURONIUM BROMIDE 100 MG/10ML IV SOLN
INTRAVENOUS | Status: DC | PRN
  Administered 2017-05-20: 50 mg via INTRAVENOUS

## 2017-05-20 MED ORDER — EPHEDRINE 5 MG/ML INJ
INTRAVENOUS | Status: AC
  Filled 2017-05-20: qty 10

## 2017-05-20 MED ORDER — PHENYLEPHRINE HCL 10 MG/ML IJ SOLN
INTRAVENOUS | Status: DC | PRN
  Administered 2017-05-20: 20 ug/min via INTRAVENOUS

## 2017-05-20 MED ORDER — ALBUTEROL SULFATE (2.5 MG/3ML) 0.083% IN NEBU: 2.5000 mg | INHALATION_SOLUTION | Freq: Four times a day (QID) | RESPIRATORY_TRACT | Status: DC | PRN

## 2017-05-20 MED ORDER — ALBUTEROL SULFATE HFA 108 (90 BASE) MCG/ACT IN AERS: 2.0000 | INHALATION_SPRAY | Freq: Four times a day (QID) | RESPIRATORY_TRACT | Status: DC

## 2017-05-20 MED ORDER — PHENYLEPHRINE 40 MCG/ML (10ML) SYRINGE FOR IV PUSH (FOR BLOOD PRESSURE SUPPORT)
PREFILLED_SYRINGE | INTRAVENOUS | Status: AC
  Filled 2017-05-20: qty 10

## 2017-05-20 MED ORDER — SCOPOLAMINE 1 MG/3DAYS TD PT72
MEDICATED_PATCH | TRANSDERMAL | Status: DC | PRN
  Administered 2017-05-20: 1.5 mg via TRANSDERMAL

## 2017-05-20 MED ORDER — HYDROMORPHONE HCL 1 MG/ML IJ SOLN
INTRAMUSCULAR | Status: AC
  Administered 2017-05-20: 17:00:00
  Filled 2017-05-20: qty 1

## 2017-05-20 MED ORDER — FENTANYL CITRATE (PF) 100 MCG/2ML IJ SOLN
INTRAMUSCULAR | Status: DC | PRN
  Administered 2017-05-20: 200 ug via INTRAVENOUS
  Administered 2017-05-20: 50 ug via INTRAVENOUS

## 2017-05-20 MED ORDER — EPHEDRINE SULFATE-NACL 50-0.9 MG/10ML-% IV SOSY
PREFILLED_SYRINGE | INTRAVENOUS | Status: DC | PRN
  Administered 2017-05-20 (×3): 10 mg via INTRAVENOUS

## 2017-05-20 MED ORDER — LIDOCAINE 2% (20 MG/ML) 5 ML SYRINGE
INTRAMUSCULAR | Status: AC
  Filled 2017-05-20: qty 5

## 2017-05-20 MED ORDER — OXYCODONE HCL 5 MG PO TABS
5.0000 mg | ORAL_TABLET | ORAL | Status: DC | PRN
  Administered 2017-05-20 – 2017-05-22 (×10): 10 mg via ORAL
  Filled 2017-05-20 (×9): qty 2

## 2017-05-20 MED ORDER — METHOCARBAMOL 1000 MG/10ML IJ SOLN
500.0000 mg | Freq: Four times a day (QID) | INTRAVENOUS | Status: DC | PRN
  Filled 2017-05-20: qty 5

## 2017-05-20 SURGICAL SUPPLY — 76 items
BANDAGE ACE 3X5.8 VEL STRL LF (GAUZE/BANDAGES/DRESSINGS) ×3 IMPLANT
BANDAGE ACE 4X5 VEL STRL LF (GAUZE/BANDAGES/DRESSINGS) ×3 IMPLANT
BIT DRILL 1.6 90 (DRILL) ×2 IMPLANT
BIT DRILL 1.6 90MM (DRILL) ×1
BLADE CLIPPER SURG (BLADE) IMPLANT
BNDG ESMARK 4X9 LF (GAUZE/BANDAGES/DRESSINGS) ×3 IMPLANT
BNDG GAUZE ELAST 4 BULKY (GAUZE/BANDAGES/DRESSINGS) ×3 IMPLANT
CORDS BIPOLAR (ELECTRODE) ×3 IMPLANT
COVER SURGICAL LIGHT HANDLE (MISCELLANEOUS) ×3 IMPLANT
CUFF TOURNIQUET SINGLE 18IN (TOURNIQUET CUFF) ×3 IMPLANT
CUFF TOURNIQUET SINGLE 24IN (TOURNIQUET CUFF) IMPLANT
CUP FUSION 18MM DIA HOLE X10 (Cup) ×3 IMPLANT
DECANTER SPIKE VIAL GLASS SM (MISCELLANEOUS) IMPLANT
DRAIN TLS ROUND 10FR (DRAIN) IMPLANT
DRAPE INCISE IOBAN 66X45 STRL (DRAPES) IMPLANT
DRAPE OEC MINIVIEW 54X84 (DRAPES) IMPLANT
DRAPE U-SHAPE 47X51 STRL (DRAPES) ×3 IMPLANT
DRSG ADAPTIC 3X8 NADH LF (GAUZE/BANDAGES/DRESSINGS) ×3 IMPLANT
ELECT REM PT RETURN 9FT ADLT (ELECTROSURGICAL)
ELECTRODE REM PT RTRN 9FT ADLT (ELECTROSURGICAL) IMPLANT
GAUZE SPONGE 4X4 12PLY STRL (GAUZE/BANDAGES/DRESSINGS) ×3 IMPLANT
GAUZE XEROFORM 1X8 LF (GAUZE/BANDAGES/DRESSINGS) IMPLANT
GAUZE XEROFORM 5X9 LF (GAUZE/BANDAGES/DRESSINGS) ×3 IMPLANT
GLOVE BIOGEL M 8.0 STRL (GLOVE) ×3 IMPLANT
GLOVE SS BIOGEL STRL SZ 8 (GLOVE) ×1 IMPLANT
GLOVE SUPERSENSE BIOGEL SZ 8 (GLOVE) ×2
GOWN STRL REUS W/ TWL LRG LVL3 (GOWN DISPOSABLE) ×3 IMPLANT
GOWN STRL REUS W/ TWL XL LVL3 (GOWN DISPOSABLE) ×3 IMPLANT
GOWN STRL REUS W/TWL LRG LVL3 (GOWN DISPOSABLE) ×6
GOWN STRL REUS W/TWL XL LVL3 (GOWN DISPOSABLE) ×6
K-WIRE 1.6X100 (WIRE) ×5 IMPLANT
K-WIRE FIXATION 0.8X80 (WIRE) ×6
K-WIRE FX100X1.6XNS KRSH (WIRE) ×1
KIT BASIN OR (CUSTOM PROCEDURE TRAY) ×3 IMPLANT
KIT INFUSE SMALL (Orthopedic Implant) ×3 IMPLANT
KIT ROOM TURNOVER OR (KITS) ×3 IMPLANT
KWIRE FIXATION 0.8X80 (WIRE) ×2 IMPLANT
KWIRE FX100X1.6XNS KRSH (WIRE) ×1 IMPLANT
LOOP VESSEL MAXI BLUE (MISCELLANEOUS) ×3 IMPLANT
MANIFOLD NEPTUNE II (INSTRUMENTS) IMPLANT
NEEDLE 22X1 1/2 (OR ONLY) (NEEDLE) IMPLANT
NEEDLE BLUNT 16X1.5 OR ONLY (NEEDLE) IMPLANT
NS IRRIG 1000ML POUR BTL (IV SOLUTION) ×3 IMPLANT
PACK ORTHO EXTREMITY (CUSTOM PROCEDURE TRAY) ×3 IMPLANT
PAD ARMBOARD 7.5X6 YLW CONV (MISCELLANEOUS) ×6 IMPLANT
PAD CAST 4YDX4 CTTN HI CHSV (CAST SUPPLIES) ×3 IMPLANT
PADDING CAST COTTON 4X4 STRL (CAST SUPPLIES) ×6
PUTTY DBM STAGRAFT PLUS 10CC (Putty) ×3 IMPLANT
REAMER FUSION CUP 18 (MISCELLANEOUS) ×3 IMPLANT
REAMER FUSION CUP 18MM (BLADE) IMPLANT
SCREW LOCK 14X2.4X ELB (Screw) ×2 IMPLANT
SCREW LOCK 20X2.4X ELB (Screw) ×1 IMPLANT
SCREW LOCK CORT 2.4X10 (Screw) ×3 IMPLANT
SCREW LOCK CORT 2.4X10MM (Screw) ×9 IMPLANT
SCREW LOCK CORT 2.4X12MM (Screw) ×6 IMPLANT
SCREW LOCKING 2.4X14 (Screw) ×4 IMPLANT
SCREW LOCKING 2.4X20 (Screw) ×2 IMPLANT
SCRUB BETADINE 4OZ XXX (MISCELLANEOUS) ×3 IMPLANT
SOL PREP POV-IOD 4OZ 10% (MISCELLANEOUS) ×3 IMPLANT
SPLINT FIBERGLASS 3X35 (CAST SUPPLIES) ×3 IMPLANT
SPONGE LAP 4X18 X RAY DECT (DISPOSABLE) ×3 IMPLANT
STAPLER VISISTAT 35W (STAPLE) IMPLANT
SUCTION FRAZIER HANDLE 10FR (MISCELLANEOUS) ×2
SUCTION TUBE FRAZIER 10FR DISP (MISCELLANEOUS) ×1 IMPLANT
SUT ETHILON 4 0 PS 2 18 (SUTURE) IMPLANT
SUT PROLENE 4 0 PS 2 18 (SUTURE) ×6 IMPLANT
SUT VIC AB 3-0 FS2 27 (SUTURE) IMPLANT
SYR CONTROL 10ML LL (SYRINGE) IMPLANT
SYSTEM CHEST DRAIN TLS 7FR (DRAIN) IMPLANT
TOWEL OR 17X24 6PK STRL BLUE (TOWEL DISPOSABLE) ×3 IMPLANT
TOWEL OR 17X26 10 PK STRL BLUE (TOWEL DISPOSABLE) ×3 IMPLANT
TUBE CONNECTING 12'X1/4 (SUCTIONS) ×1
TUBE CONNECTING 12X1/4 (SUCTIONS) ×2 IMPLANT
TUBE EVACUATION TLS (MISCELLANEOUS) ×3 IMPLANT
WATER STERILE IRR 1000ML POUR (IV SOLUTION) ×3 IMPLANT
YANKAUER SUCT BULB TIP NO VENT (SUCTIONS) IMPLANT

## 2017-05-20 NOTE — Anesthesia Procedure Notes (Signed)
Procedure Name: Intubation Date/Time: 05/20/2017 1:42 PM Performed by: Orlie Dakin, CRNA Pre-anesthesia Checklist: Patient identified, Emergency Drugs available, Suction available, Patient being monitored and Timeout performed Patient Re-evaluated:Patient Re-evaluated prior to induction Oxygen Delivery Method: Circle system utilized Preoxygenation: Pre-oxygenation with 100% oxygen Induction Type: IV induction Ventilation: Mask ventilation without difficulty Laryngoscope Size: Miller and 3 Grade View: Grade I Tube type: Oral Tube size: 7.0 mm Number of attempts: 1 Airway Equipment and Method: Stylet Placement Confirmation: ETT inserted through vocal cords under direct vision,  breath sounds checked- equal and bilateral and positive ETCO2 Secured at: 23 cm Tube secured with: Tape Dental Injury: Teeth and Oropharynx as per pre-operative assessment  Comments: Noted chipped upper right front tooth pre-op.

## 2017-05-20 NOTE — Anesthesia Preprocedure Evaluation (Addendum)
Anesthesia Evaluation  Patient identified by MRN, date of birth, ID band Patient awake    Reviewed: Allergy & Precautions, NPO status , Patient's Chart, lab work & pertinent test results  History of Anesthesia Complications (+) PONV  Airway Mallampati: II  TM Distance: >3 FB Neck ROM: Full    Dental  (+) Chipped, Dental Advisory Given, Missing   Pulmonary sleep apnea and Continuous Positive Airway Pressure Ventilation , COPD,  COPD inhaler,    breath sounds clear to auscultation       Cardiovascular hypertension, Pt. on medications (-) angina Rhythm:Regular Rate:Normal  '15 ECHO: EF 60-65%, valves OK  '15 stress: no perfusion defects. The LV systolic function is good   Neuro/Psych  Headaches, Anxiety Depression Chronic pain: narcotics    GI/Hepatic Neg liver ROS, GERD  Medicated and Controlled,  Endo/Other  Hypothyroidism Morbid obesity  Renal/GU negative Renal ROS     Musculoskeletal  (+) Fibromyalgia -, narcotic dependent  Abdominal (+) + obese,   Peds  Hematology   Anesthesia Other Findings   Reproductive/Obstetrics                            Anesthesia Physical Anesthesia Plan  ASA: III  Anesthesia Plan: General   Post-op Pain Management:    Induction: Intravenous  PONV Risk Score and Plan: 4 or greater and Scopolamine patch - Pre-op, Dexamethasone and Ondansetron  Airway Management Planned: LMA  Additional Equipment:   Intra-op Plan:   Post-operative Plan:   Informed Consent: I have reviewed the patients History and Physical, chart, labs and discussed the procedure including the risks, benefits and alternatives for the proposed anesthesia with the patient or authorized representative who has indicated his/her understanding and acceptance.   Dental advisory given  Plan Discussed with: CRNA and Surgeon  Anesthesia Plan Comments: (Plan routine monitors, GA- LMA OK Pt  did not like numb arm with block last surgery, so since iliac crest graft as well, will rely on local analgesia, and multimodal post op analgesia)        Anesthesia Quick Evaluation

## 2017-05-20 NOTE — Op Note (Signed)
See Alger Simons MD
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
## 2017-05-20 NOTE — Anesthesia Postprocedure Evaluation (Signed)
Anesthesia Post Note  Patient: Sabrina [name]  Procedure(s) Performed: RIGHT TOTAL WRIST FUSION WITH ILIAC CREST BONE GRAFT (Right Wrist)     Patient location during evaluation: PACU Anesthesia Type: General Level of consciousness: sedated, patient cooperative and oriented Pain management: pain level controlled (pt able to sleep) Vital Signs Assessment: post-procedure vital signs reviewed and stable Respiratory status: spontaneous breathing, nonlabored ventilation, respiratory function stable and patient connected to nasal cannula oxygen Cardiovascular status: blood pressure returned to baseline and stable Postop Assessment: no apparent nausea or vomiting Anesthetic complications: no    Last Vitals:  Vitals:   05/20/17 1715 05/20/17 1730  BP: 102/60 105/60  Pulse: 89 84  Resp: 14 14  Temp:    SpO2: 94% 96%    Last Pain:  Vitals:   05/20/17 1730  TempSrc:   PainSc: 6                  Marieme Mcmackin,E. Maysel Mccolm

## 2017-05-20 NOTE — Transfer of Care (Signed)
Immediate Anesthesia Transfer of Care Note  Patient: Sabrina [name]  Procedure(s) Performed: RIGHT TOTAL WRIST FUSION WITH ILIAC CREST BONE GRAFT (Right Wrist)  Patient Location: PACU  Anesthesia Type:General  Level of Consciousness: drowsy and patient cooperative  Airway & Oxygen Therapy: Patient Spontanous Breathing and Patient connected to face mask oxygen  Post-op Assessment: Report given to RN and Post -op Vital signs reviewed and stable  Post vital signs: Reviewed and stable  Last Vitals:  Vitals:   05/20/17 1109 05/20/17 1600  BP: 128/77   Pulse: 92   Resp: 18   Temp: 37 C (P) 37.7 C  SpO2: 95%     Last Pain:  Vitals:   05/20/17 1600  TempSrc:   PainSc: (P) Asleep         Complications: No apparent anesthesia complications

## 2017-05-21 DIAGNOSIS — M19031 Primary osteoarthritis, right wrist: Secondary | ICD-10-CM | POA: Diagnosis not present

## 2017-05-21 MED ORDER — HYDROMORPHONE HCL 1 MG/ML IJ SOLN
0.5000 mg | INTRAMUSCULAR | Status: DC | PRN
  Administered 2017-05-21 (×2): 0.5 mg via INTRAVENOUS
  Filled 2017-05-21 (×2): qty 1

## 2017-05-21 NOTE — Progress Notes (Signed)
Called Dr. Amedeo Plenty service per pt request.  Patient pacing floor/halls stating she has numbness, tingling, burning, coldness and throbbing in RLE.  Dr. Apolonio Schneiders states these symptoms are not unusual and are expected, please place warm pack to fingers, continue to move fingers and not elevate above heart level.  MD to see pt in am.
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
## 2017-05-21 NOTE — Progress Notes (Signed)
Patient has been seen and examined. Patient has pain appropriate to his injury/process. Patient denies new complaints at this present time. I have discussed the care pathway with nursing staff. Patient is appropriate and alert.  We reviewed vital signs and intake output which are stable.  The upper extremity is neurovascularly intact. Refill is normal. There is no signs of compartment syndrome. There is no signs of dystrophy. There is normal sensation.  I have spent a  great deal of time discussing range of motion edema control and other techniques to decrease edema and promote flexion extension of the fingers. Patient understands the importance of elevation range of motion massage and other measures to lessen pain and prevent swelling.  We have also discussed immobilization to appropriate areas involved.  We have discussed with the patient shoulder range of motion to prevent adhesive capsulitis.  The remainder of the examination is normal today without complicating feature.  Drain was removed without difficulty  Patient will be discharged home. Will plan to see the patient back in the office as per discharge instructions (please see discharge instructions).  Patient had an uneventful hospital course. At the time of discharge patient is stable awake alert and oriented in no acute distress. Regular diet will be continued and has been tolerated. Patient will notify should have problems occur. There is no signs of DVT infection or other complication at this juncture.  All questions have been incurred and answered.  Please see discharge med list  [name] MDPatient ID: Sabrina [name], female   DOB: [birth year], 57 y.o.   MRN: [id number]

## 2017-05-21 NOTE — Evaluation (Signed)
Occupational Therapy Evaluation Patient Details Name: Sabrina [name] MRN: [id number] DOB: 10-Jun-1960 Today's Date: 05/21/2017    History of Present Illness Pt is a 57 y/o F status post RIGHT TOTAL WRIST FUSION WITH ILIAC CREST BONE GRAFT on 05/20/2016. PMHx includes anxiety, depression, Fibromyalgia, GERD, hyperlipidemia, HTN, previous R wrist surgery    Clinical Impression   This 57 y/o F presents with the above. At baseline Pt is independent with ADLs and functional mobility. Pt presenting with increased pain in RUE, decreased ADL performance due to RUE functional limitations. Pt completed room and hallway functional mobility without AD, LB dressing and standing grooming ADLs with MinGuard assist throughout. Pt demonstrating good use of compensatory techniques during ADL completion. Education provided on additional compensatory and edema reduction techniques with Pt verbalizing and return demonstrating understanding. Pt lives alone and will have intermittent assist from friends after discharge. Questions answered throughout. No further acute OT needs identified at this time. Recommend Pt follow-up as arranged by MD. Will sign off.     Follow Up Recommendations  DC plan and follow up therapy as arranged by surgeon;Supervision - Intermittent    Equipment Recommendations  None recommended by OT           Precautions / Restrictions Precautions Precautions: None Restrictions Weight Bearing Restrictions: Yes RUE Weight Bearing: Weight bear through elbow only Other Position/Activity Restrictions: assume NWB R wrist/hand      Mobility Bed Mobility Overal bed mobility: Modified Independent                Transfers Overall transfer level: Modified independent               General transfer comment: supervision for safety; no physical assist needed     Balance Overall balance assessment: Needs assistance Sitting-balance support: Feet supported Sitting balance-Leahy  Scale: Normal     Standing balance support: No upper extremity supported;During functional activity Standing balance-Leahy Scale: Good                             ADL either performed or assessed with clinical judgement   ADL Overall ADL's : Needs assistance/impaired Eating/Feeding: Modified independent;Sitting   Grooming: Oral care;Supervision/safety;Standing Grooming Details (indicate cue type and reason): Pt utilizing one-handed techniques to complete task  Upper Body Bathing: Min guard;Sitting   Lower Body Bathing: Min guard;Sit to/from stand   Upper Body Dressing : Sitting;Min guard;Set up Upper Body Dressing Details (indicate cue type and reason): donning second gown  Lower Body Dressing: Min guard;Sit to/from stand Lower Body Dressing Details (indicate cue type and reason): Pt dons socks seated EOB using one hand technique and figure 4 position  Toilet Transfer: Min guard;Ambulation;Regular Museum/gallery exhibitions officer and Hygiene: Min guard;Sit to/from stand       Functional mobility during ADLs: Min guard General ADL Comments: educated on compensatory techniques for completing ADLs, edema reduction technqiues with Pt verbalizing understanding                          Pertinent Vitals/Pain Pain Assessment: 0-10 Pain Score: 7  Pain Descriptors / Indicators: Aching;Sore;Throbbing Pain Intervention(s): Monitored during session;Limited activity within patient's tolerance;Premedicated before session;Ice applied     Hand Dominance Right   Extremity/Trunk Assessment Upper Extremity Assessment Upper Extremity Assessment: RUE deficits/detail RUE Deficits / Details: R hand/wrist/forearm splinted; elbow and shoulder AROM appear WFL; Pt able to move distal aspect  of digits that are not immobilized  RUE: Unable to fully assess due to pain;Unable to fully assess due to immobilization RUE Sensation: decreased light touch   Lower Extremity  Assessment Lower Extremity Assessment: Overall WFL for tasks assessed       Communication Communication Communication: No difficulties   Cognition Arousal/Alertness: Awake/alert Behavior During Therapy: WFL for tasks assessed/performed Overall Cognitive Status: Within Functional Limits for tasks assessed                                     General Comments  Pt's friend present during session                [place] expects to be discharged to:: Private residence Living Arrangements: Parent(Pt cares for her mother ) Available Help at Discharge: Friend(s);Available PRN/intermittently Type of Home: House Home Access: Stairs to enter [employer] of Steps: 2 Entrance Stairs-Rails: Left Home Layout: One level     Bathroom Shower/Tub: Tub/shower unit;Walk-in shower   Bathroom Toilet: Standard     Home Equipment: None          Prior Functioning/Environment Level of Independence: Independent        Comments: Pt is the primary caregiver for her mother who is in a w/c         OT Problem List: Pain;Decreased activity tolerance;Impaired UE functional use;Decreased range of motion;Obesity      OT Treatment/Interventions:      OT Goals(Current goals can be found in the care plan section) Acute Rehab OT Goals Patient Stated Goal: less pain  OT Goal Formulation: All assessment and education complete, DC therapy                                 AM-PAC PT "6 Clicks" Daily Activity     Outcome Measure Help from another person eating meals?: None Help from another person taking care of personal grooming?: None Help from another person toileting, which includes using toliet, bedpan, or urinal?: None Help from another person bathing (including washing, rinsing, drying)?: A Little Help from another person to put on and taking off regular upper body clothing?: A Little Help from another person to put on and taking off  regular lower body clothing?: A Little 6 Click Score: 21   End of Session Equipment Utilized During Treatment: Gait belt Nurse Communication: Mobility status  Activity Tolerance: Patient tolerated treatment well Patient left: in bed;with call bell/phone within reach;with nursing/sitter in room;with family/visitor present  OT Visit Diagnosis: Pain Pain - Right/Left: Right Pain - part of body: Hand                Time: 5009-3818 OT Time Calculation (min): 22 min Charges:  OT General Charges $OT Visit: 1 Visit OT Evaluation $OT Eval Low Complexity: 1 Low G-Codes:     Lou Cal, OT Pager [phone] 05/21/2017   Raymondo Band 05/21/2017, 12:15 PM

## 2017-05-21 NOTE — Discharge Instructions (Signed)

## 2017-05-21 NOTE — Progress Notes (Signed)
PT Cancellation Note  Patient Details Name: Sabrina [name] MRN: [id number] DOB: 1961-03-25   Cancelled Treatment:    Reason Eval/Treat Not Completed: Patient declined, no reason specified.  Was seen by OT and feels she is getting around fine.  Ck tomorrow to ensure she is still fine then may DC the order.   Ramond Dial 05/21/2017, 12:41 PM   Mee Hives, PT MS Acute Rehab Dept. Number: [place] and [place]

## 2017-05-21 NOTE — Progress Notes (Signed)
Pt asked to stay overnight, MD called and instructed to keep pt until tomorrow. Changes made to iv pain meds in an effort to wean her off iv medication

## 2017-05-21 NOTE — Op Note (Signed)
NAME:  Sabrina [name], Sabrina [name]                  ACCOUNT NO.:  MEDICAL RECORD NO.:  [id number]  LOCATION:                                 FACILITY:  PHYSICIAN:  Satira Anis. Jshaun Abernathy, M.D.     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  PROCEDURES: 1. Scaphoid excision, right wrist. 2. Four-corner fusion with autologous bone graft and BMP as well as     allograft material, right wrist. 3. Tenolysis, tenosynovectomy, third, second, and fourth dorsal     compartments. 4. Four-view radiographic series, right wrist.  SURGEON:  Satira Anis. Amedeo Plenty, MD.  ASSISTANT:  Avelina Laine, PA-C.  COMPLICATIONS:  None.  ANESTHESIA:  General.  TOURNIQUET TIME:  Less than 2 hours.  DRAINS:  One drain was placed and pulled at the conclusion of the case.  Preoperatively, I counseled her in regard to risks and benefits of surgery.  She has had a prior radial lunate fusion and a failure of fusion about the scaphoid and radius.  This has necessitated chronic pain and some ensuing capitolunate degenerative changes.  I have discussed with her options of care, treatment issues and note that she has completely failed conservative management algorithm.  Thus, she desires to proceed with the above-mentioned surgical intervention.  OPERATIVE PROCEDURE:  The patient was seen by myself and Anesthesia, taken to the operative theater and underwent smooth induction of general anesthetic.  She was prepped with Hibiclens scrub x2 followed by a 10- minutes surgical Betadine scrub.  Once this was complete, we then made sterile field for her.  The patient tolerated this  well.  Once this was complete, I then performed incision dorsally under 250 mmHg of tourniquet control.  Time-out was observed.  Preoperative antibiotics were given.  Incision was made.  Dissection was carried down.  The patient prior to surgery necessitated an EPL tenolysis, tenosynovectomy followed by second and fourth dorsal compartment tenolysis, tenosynovectomy.  I held the structures carefully.  The patient had a prior posterior interosseous nerve neurectomy and I did not have to redo this.  I entered the wrist capsule in an H-shaped fashion.  I then excised the scaphoid in its entirety and used this for autologous bone graft.  I then looked at the capitate and lunate.  The capitolunate degenerative changes were fairly pronounced.  The capitate had wear on its face, indicative of the subluxation and ensuing degenerative disease.  At this time, we then moved forward with four-corner fusion which would effectively be a total wrist fusion given her history of the radiolunate fusion.  The patient had the triquetrum hamate, capitate, and lunate all prepared without difficulty.  Autologous bone graft was taken from the radius.  I placed a stay graft in the wake of the autologous bone graft in the radial styloid.  I stayed away from the radial lunate region where good bone was noted.  Once this was complete, I then pinned the joint in a reduced position, packed bone graft and pre- soaked for 15 minutes BMP derivative and placed this in the volar floor as well as in the area fusion itself very carefully.  The patient tolerated this well.  Bone graft was placed.  X-ray was checked and looked good.  Following this, I performed a TriMed circular plate placement.  I was able to place 2 screws in the capitate, lunate, triquetrum, and hamate beginning in the lunate followed by hamate, then the triquetrum, then the capitate and then did this in a similar fashion once again.  I tightened this down  and was able to get good purchase. The patient had a very nice stable construct.  X-rays on AP, lateral, and oblique showed excellent screw lengths, distal radioulnar joint, ulnar wrist and the area about the [facility] looked quite well.  We irrigated copiously, placed BMP against the plate and following this, closed the capsule nicely.  I then closed the wound over drain.  At the conclusion of the case, the drain was pulled as there was minimal discharge. Excellent refill was noted and there were no complicating features.  All sponge, needle, and instrument counts were reported as correct.  X- rays looked excellent.  The patient tolerated the procedure well.  We will monitor closely.  See her back in the office in 2 weeks.  We are going to cast her for 6-8 weeks and make sure things go smoothly.  I want to be very careful with her progress going forward so that we were able to achieve a good solid fusion.  Hopefully, this will rid her of her chronic pain complaints.  It is certainly at the expense of wrist motion and she understands this.  Nevertheless, she has a good neutral position and hopefully wrist will function well in the future.  All questions have been encouraged and answered.     Satira Anis. Amedeo Plenty, M.D.     [facility]  D:  05/20/2017  T:  05/20/2017  Job:  [id number]

## 2017-05-22 DIAGNOSIS — M19031 Primary osteoarthritis, right wrist: Secondary | ICD-10-CM | POA: Diagnosis not present

## 2017-05-22 NOTE — Progress Notes (Signed)
PT Cancellation Note  Patient Details Name: Sabrina [name] MRN: [id number] DOB: July 07, 1960   Cancelled Treatment:      Patient received order for evaluation. Upon entrance patient was already up and moving in the room. She reported she was independent getting out of bed and had no need for skilled therapy. She reports feeling comfortable getting in and out of her house. She has no need for skilled therapy at this time.   Carney Living PT DPT  05/22/2017, 8:34 AM

## 2017-05-22 NOTE — Progress Notes (Signed)
Pt discharge instructions given, pt verbalized understanding.  VSS.  Denies pain at this time. Pleasant.  Pt waiting on son to transport home.

## 2017-05-22 NOTE — Progress Notes (Signed)
Patient ID: Sabrina [name], female   DOB: 1960-11-01, 57 y.o.   MRN: [id number] Patient is doing reasonably well  I changed her bandage at bedside.  There is no signs of infection dystrophy or neurovascular compromise.  She is safe comfortable and ready to be discharged. We held her  An extra day due to pain control issues.  Please see my discharge summary  Kule Gascoigne M.D.

## 2017-05-24 NOTE — Telephone Encounter (Signed)
Sabrina [name] - [facility] states they do not participate in pt's [employer] Blue Option. Faxed Referral to [facility].

## 2017-05-25 ENCOUNTER — Encounter (HOSPITAL_COMMUNITY): Payer: Self-pay | Admitting: Orthopedic Surgery

## 2017-05-28 ENCOUNTER — Encounter (HOSPITAL_COMMUNITY): Payer: Self-pay | Admitting: Orthopedic Surgery

## 2017-06-02 ENCOUNTER — Encounter (HOSPITAL_COMMUNITY): Payer: Self-pay | Admitting: Orthopedic Surgery

## 2017-06-15 ENCOUNTER — Encounter: Payer: Self-pay | Admitting: Podiatry

## 2017-06-15 NOTE — Progress Notes (Signed)
[facility] declined patients referral- nothing else they could offer her

## 2017-06-25 ENCOUNTER — Other Ambulatory Visit: Payer: Self-pay | Admitting: Family Medicine

## 2017-06-28 NOTE — Telephone Encounter (Signed)
[facility] [place]  RF request for methocarbamol LOV: 04/21/17 Next ov: 10/18/17 Last written: 01/18/17 #180 w/ 1RF  Please advise. Thanks.

## 2017-07-06 ENCOUNTER — Other Ambulatory Visit: Payer: Self-pay

## 2017-07-06 MED ORDER — CLONAZEPAM 1 MG PO TABS: 1.0000 mg | ORAL_TABLET | Freq: Three times a day (TID) | ORAL | 5 refills | Status: DC | PRN

## 2017-07-06 NOTE — Telephone Encounter (Signed)
I did a rx for clonazepam 1mg , 1 bid prn, #90, with 5 RFs in December 2018. However, it appears that rx was never sent to the pharmacy. A check of [place] online controlled substances database does not show this rx, but shows the rx for clonaz 1mg , 1 bid, #60, with RF x 2 done December 2018.  I'll print rx today for clonazepam.

## 2017-07-26 ENCOUNTER — Encounter: Payer: Self-pay | Admitting: Family Medicine

## 2017-08-11 ENCOUNTER — Encounter: Payer: Self-pay | Admitting: Family Medicine

## 2017-08-13 ENCOUNTER — Ambulatory Visit: Payer: BC Managed Care – PPO | Admitting: Podiatry

## 2017-08-23 ENCOUNTER — Other Ambulatory Visit: Payer: Self-pay | Admitting: Orthopedic Surgery

## 2017-08-23 ENCOUNTER — Ambulatory Visit: Payer: Self-pay | Admitting: Orthopedic Surgery

## 2017-08-29 ENCOUNTER — Other Ambulatory Visit: Payer: Self-pay | Admitting: Family Medicine

## 2017-08-31 ENCOUNTER — Ambulatory Visit: Payer: BC Managed Care – PPO | Admitting: Podiatry

## 2017-08-31 ENCOUNTER — Encounter: Payer: Self-pay | Admitting: Podiatry

## 2017-08-31 DIAGNOSIS — M79674 Pain in right toe(s): Secondary | ICD-10-CM

## 2017-08-31 DIAGNOSIS — M79675 Pain in left toe(s): Secondary | ICD-10-CM

## 2017-08-31 DIAGNOSIS — G894 Chronic pain syndrome: Secondary | ICD-10-CM | POA: Diagnosis not present

## 2017-08-31 DIAGNOSIS — G629 Polyneuropathy, unspecified: Secondary | ICD-10-CM | POA: Diagnosis not present

## 2017-08-31 DIAGNOSIS — B351 Tinea unguium: Secondary | ICD-10-CM

## 2017-08-31 DIAGNOSIS — L600 Ingrowing nail: Secondary | ICD-10-CM

## 2017-08-31 NOTE — Pre-Procedure Instructions (Signed)
Sabrina [name]  08/31/2017      [id number] [place], [place] - [address] [place] [address] Phone: [phone] Fax: [phone]    Your procedure is scheduled on Thurs., Sep 09, 2017  Report to [facility] Admitting Entrance "A" at 10:30AM  Call this number if you have problems the morning of surgery:  [phone]   Remember:  Do not eat food or drink liquids after midnight.  Take these medicines the morning of surgery with A SIP OF WATER: Omeprazole (PRILOSEC), Gabapentin (NEURONTIN), and Methocarbamol (ROBAXIN). If needed  Acetaminophen (TYLENOL) for pain, ClonazePAM (KLONOPIN), and Albuterol Inhaler for cough or wheezing (Bring with you the day of surgery).  7 days before surgery (5/2), stop taking all Aspirins, Vitamins, Fish oils, and Herbal medications. Also stop all NSAIDS i.e. Advil, Ibuprofen, Motrin, Aleve, Anaprox, Naproxen, BC and Goody Powders.   Do not wear jewelry, make-up or nail polish.  Do not wear lotions, powders, or perfumes, or deodorant.  Do not shave 48 hours prior to surgery.   Do not bring valuables to the hospital.  [facility] is not responsible for any belongings or valuables.  Contacts, dentures or bridgework may not be worn into surgery.  Leave your suitcase in the car.  After surgery it may be brought to your room.  For patients admitted to the hospital, discharge time will be determined by your treatment team.  Patients discharged the day of surgery will not be allowed to drive home.   Special instructions:   [place]- Preparing For Surgery  Before surgery, you can play an important role. Because skin is not sterile, your skin needs to be as free of germs as possible. You can reduce the number of germs on your skin by washing with CHG (chlorahexidine gluconate) Soap before surgery.  CHG is an antiseptic cleaner which kills germs and bonds with the skin  to continue killing germs even after washing.  Please do not use if you have an allergy to CHG or antibacterial soaps. If your skin becomes reddened/irritated stop using the CHG.  Do not shave (including legs and underarms) for at least 48 hours prior to first CHG shower. It is OK to shave your face.  Please follow these instructions carefully.   1. Shower the NIGHT BEFORE SURGERY and the MORNING OF SURGERY with CHG.   2. If you chose to wash your hair, wash your hair first as usual with your normal shampoo.  3. After you shampoo, rinse your hair and body thoroughly to remove the shampoo.  4. Use CHG as you would any other liquid soap. You can apply CHG directly to the skin and wash gently with a scrungie or a clean washcloth.   5. Apply the CHG Soap to your body ONLY FROM THE NECK DOWN.  Do not use on open wounds or open sores. Avoid contact with your eyes, ears, mouth and genitals (private parts). Wash Face and genitals (private parts)  with your normal soap.  6. Wash thoroughly, paying special attention to the area where your surgery will be performed.  7. Thoroughly rinse your body with warm water from the neck down.  8. DO NOT shower/wash with your normal soap after using and rinsing off the CHG Soap.  9. Pat yourself dry with a CLEAN TOWEL.  10. Wear CLEAN PAJAMAS to bed the night before surgery, wear comfortable clothes  the morning of surgery  11. Place CLEAN SHEETS on your bed the night of your first shower and DO NOT SLEEP WITH PETS.  Day of Surgery: Do not apply any deodorants/lotions. Please wear clean clothes to the hospital/surgery center.    Please read over the following fact sheets that you were given. Pain Booklet, Coughing and Deep Breathing, MRSA Information and Surgical Site Infection Prevention

## 2017-09-01 ENCOUNTER — Telehealth: Payer: Self-pay | Admitting: *Deleted

## 2017-09-01 ENCOUNTER — Encounter (HOSPITAL_COMMUNITY)
Admission: RE | Admit: 2017-09-01 | Discharge: 2017-09-01 | Disposition: A | Discharge status: Home / Self Care | Payer: BC Managed Care – PPO | Source: Ambulatory Visit | Attending: Orthopedic Surgery | Admitting: Orthopedic Surgery

## 2017-09-01 ENCOUNTER — Other Ambulatory Visit: Payer: Self-pay

## 2017-09-01 ENCOUNTER — Encounter (HOSPITAL_COMMUNITY): Payer: Self-pay

## 2017-09-01 DIAGNOSIS — G629 Polyneuropathy, unspecified: Secondary | ICD-10-CM

## 2017-09-01 DIAGNOSIS — Z01812 Encounter for preprocedural laboratory examination: Secondary | ICD-10-CM | POA: Diagnosis not present

## 2017-09-01 DIAGNOSIS — G894 Chronic pain syndrome: Secondary | ICD-10-CM

## 2017-09-01 DIAGNOSIS — M79675 Pain in left toe(s): Secondary | ICD-10-CM

## 2017-09-01 DIAGNOSIS — M79674 Pain in right toe(s): Secondary | ICD-10-CM

## 2017-09-01 HISTORY — DX: Carpal tunnel syndrome, right upper limb: G56.01

## 2017-09-01 LAB — CBC
HEMATOCRIT: 39.2 % (ref 36.0–46.0)
Hemoglobin: 12.5 g/dL (ref 12.0–15.0)
MCH: 26.2 pg (ref 26.0–34.0)
MCHC: 31.9 g/dL (ref 30.0–36.0)
MCV: 82 fL (ref 78.0–100.0)
PLATELETS: 260 10*3/uL (ref 150–400)
RBC: 4.78 MIL/uL (ref 3.87–5.11)
RDW: 15.2 % (ref 11.5–15.5)
WBC: 6.1 10*3/uL (ref 4.0–10.5)

## 2017-09-01 LAB — BASIC METABOLIC PANEL
ANION GAP: 12 (ref 5–15)
BUN: 14 mg/dL (ref 6–20)
CO2: 23 mmol/L (ref 22–32)
Calcium: 9.1 mg/dL (ref 8.9–10.3)
Chloride: 105 mmol/L (ref 101–111)
Creatinine, Ser: 0.69 mg/dL (ref 0.44–1.00)
GFR calc Af Amer: >60 mL/min (ref >60)
Glucose, Bld: 100 mg/dL — ABNORMAL HIGH (ref 65–99)
POTASSIUM: 4.3 mmol/L (ref 3.5–5.1)
SODIUM: 140 mmol/L (ref 135–145)

## 2017-09-01 NOTE — Telephone Encounter (Signed)
Required form, clinicals and demographics faxed to [facility]. 

## 2017-09-01 NOTE — Progress Notes (Signed)
Subjective: 57 year old female presents the office today for a couple of concerns.  Her primary concern is the neuropathy to her feet.  She states that she is on gabapentin and better with the dose as this is not been helping.  She is also on Cymbalta.  She is previously tried Lyrica.  She states that when it helps her neuropathy is oxycodone but she does not want to take it.  She also states that she has fibromyalgia.  She also has a history of lumbar spine fusion.  She previously was seen pain management but she has since discontinued.  She is active as any other treatments for her feet.  Also she states that she thinks that her nails be causing some of her symptoms I think it ingrown and elongated and painful.  Denies any redness or drainage or insulin to the toenail sites.  She has no other concerns today. Denies any systemic complaints such as fevers, chills, nausea, vomiting. No acute changes since last appointment, and no other complaints at this time.   Objective: AAO x3, NAD DP/PT pulses palpable bilaterally, CRT less than 3 seconds Sensation decreased with Simms Weinstein monofilament.  She has overall numbness and tingling and burning pain to her feet which continues.   Nails appear to be hypertrophic, dystrophic mostly bilateral hallux toenails and more mild to the lesser toenails however they are all somewhat ingrown.  There is no redness or drainage or any swelling to the toenail sites.   There is no area pinpoint bony tenderness or pain to vibratory sensation bilaterally. No open lesions or pre-ulcerative lesions.  No pain with calf compression, swelling, warmth, erythema  Assessment: 56 year old female with symptomatic neuropathy; chronic ingrown toenail/onychomycosis  Plan: -All treatment options discussed with the patient including all alternatives, risks, complications.  -In regards to the neuropathy and the pain to her feet she is attempted numerous treatments without any  significant improvement.  I am going to refer her to Dr. Maryjean Ka for possible evaluation of spinal cord stimulator.  -Regards her nails this may be causing some of her discomfort however this is not likely with her neuropathy although she has concerns with this.  I did sharply debride the toenail so that any complications or bleeding.  Discussed that the pain continues in the nails beginning of the nails however I do not this is been helpful for her. -Patient encouraged to call the office with any questions, concerns, change in symptoms.   Trula Slade DPM

## 2017-09-01 NOTE — Progress Notes (Signed)
PCP - Dr. Ernestine Conrad  Cardiologist - Denies  Chest x-ray - 04/30/17 (E)  EKG - 05/14/17 (E)  Stress Test - 03/18/14 (E)  ECHO - 03/08/14 (E)  Cardiac Cath - Denies  Sleep Study - Yes-Positive CPAP - Yes  LABS- 09/01/17: CBC, BMP   Anesthesia- No  Pt denies having chest pain, sob, or fever at this time. All instructions explained to the pt, with a verbal understanding of the material. Pt agrees to go over the instructions while at home for a better understanding. The opportunity to ask questions was provided.

## 2017-09-01 NOTE — Telephone Encounter (Signed)
-----   Message from Trula Slade, DPM sent at 09/01/2017  7:43 AM EDT ----- Can you please refer her to Dr. Maryjean Ka for evaluation for possible spinal cord stimulator? Thanks.

## 2017-09-09 ENCOUNTER — Encounter (HOSPITAL_COMMUNITY)
Admission: RE | Disposition: A | Discharge status: Home / Self Care | Payer: Self-pay | Source: Ambulatory Visit | Attending: Orthopedic Surgery

## 2017-09-09 ENCOUNTER — Ambulatory Visit (HOSPITAL_COMMUNITY): Payer: BC Managed Care – PPO | Admitting: Certified Registered Nurse Anesthetist

## 2017-09-09 ENCOUNTER — Ambulatory Visit (HOSPITAL_COMMUNITY)
Admission: RE | Admit: 2017-09-09 | Discharge: 2017-09-09 | Disposition: A | Discharge status: Home / Self Care | Payer: BC Managed Care – PPO | Source: Ambulatory Visit | Attending: Orthopedic Surgery | Admitting: Orthopedic Surgery

## 2017-09-09 ENCOUNTER — Encounter (HOSPITAL_COMMUNITY): Payer: Self-pay | Admitting: *Deleted

## 2017-09-09 DIAGNOSIS — F418 Other specified anxiety disorders: Secondary | ICD-10-CM | POA: Diagnosis not present

## 2017-09-09 DIAGNOSIS — I10 Essential (primary) hypertension: Secondary | ICD-10-CM | POA: Insufficient documentation

## 2017-09-09 DIAGNOSIS — Z8249 Family history of ischemic heart disease and other diseases of the circulatory system: Secondary | ICD-10-CM | POA: Insufficient documentation

## 2017-09-09 DIAGNOSIS — G5601 Carpal tunnel syndrome, right upper limb: Secondary | ICD-10-CM | POA: Diagnosis present

## 2017-09-09 DIAGNOSIS — M797 Fibromyalgia: Secondary | ICD-10-CM | POA: Insufficient documentation

## 2017-09-09 DIAGNOSIS — Z800 Family history of malignant neoplasm of digestive organs: Secondary | ICD-10-CM | POA: Diagnosis not present

## 2017-09-09 DIAGNOSIS — G629 Polyneuropathy, unspecified: Secondary | ICD-10-CM | POA: Insufficient documentation

## 2017-09-09 DIAGNOSIS — K219 Gastro-esophageal reflux disease without esophagitis: Secondary | ICD-10-CM | POA: Diagnosis not present

## 2017-09-09 DIAGNOSIS — Z79899 Other long term (current) drug therapy: Secondary | ICD-10-CM | POA: Insufficient documentation

## 2017-09-09 DIAGNOSIS — J309 Allergic rhinitis, unspecified: Secondary | ICD-10-CM | POA: Insufficient documentation

## 2017-09-09 DIAGNOSIS — E669 Obesity, unspecified: Secondary | ICD-10-CM | POA: Diagnosis not present

## 2017-09-09 DIAGNOSIS — G894 Chronic pain syndrome: Secondary | ICD-10-CM | POA: Insufficient documentation

## 2017-09-09 DIAGNOSIS — Z8541 Personal history of malignant neoplasm of cervix uteri: Secondary | ICD-10-CM | POA: Insufficient documentation

## 2017-09-09 DIAGNOSIS — Z6841 Body Mass Index (BMI) 40.0 and over, adult: Secondary | ICD-10-CM | POA: Diagnosis not present

## 2017-09-09 DIAGNOSIS — Z803 Family history of malignant neoplasm of breast: Secondary | ICD-10-CM | POA: Insufficient documentation

## 2017-09-09 DIAGNOSIS — R5382 Chronic fatigue, unspecified: Secondary | ICD-10-CM | POA: Insufficient documentation

## 2017-09-09 DIAGNOSIS — F329 Major depressive disorder, single episode, unspecified: Secondary | ICD-10-CM | POA: Diagnosis not present

## 2017-09-09 DIAGNOSIS — I209 Angina pectoris, unspecified: Secondary | ICD-10-CM | POA: Insufficient documentation

## 2017-09-09 DIAGNOSIS — Z7951 Long term (current) use of inhaled steroids: Secondary | ICD-10-CM | POA: Insufficient documentation

## 2017-09-09 DIAGNOSIS — G4733 Obstructive sleep apnea (adult) (pediatric): Secondary | ICD-10-CM | POA: Insufficient documentation

## 2017-09-09 DIAGNOSIS — G43909 Migraine, unspecified, not intractable, without status migrainosus: Secondary | ICD-10-CM | POA: Diagnosis not present

## 2017-09-09 DIAGNOSIS — Z981 Arthrodesis status: Secondary | ICD-10-CM | POA: Diagnosis not present

## 2017-09-09 DIAGNOSIS — Z825 Family history of asthma and other chronic lower respiratory diseases: Secondary | ICD-10-CM | POA: Insufficient documentation

## 2017-09-09 DIAGNOSIS — Z885 Allergy status to narcotic agent status: Secondary | ICD-10-CM | POA: Insufficient documentation

## 2017-09-09 DIAGNOSIS — G4730 Sleep apnea, unspecified: Secondary | ICD-10-CM | POA: Diagnosis not present

## 2017-09-09 DIAGNOSIS — E785 Hyperlipidemia, unspecified: Secondary | ICD-10-CM | POA: Insufficient documentation

## 2017-09-09 DIAGNOSIS — R32 Unspecified urinary incontinence: Secondary | ICD-10-CM | POA: Diagnosis not present

## 2017-09-09 DIAGNOSIS — D500 Iron deficiency anemia secondary to blood loss (chronic): Secondary | ICD-10-CM | POA: Diagnosis not present

## 2017-09-09 DIAGNOSIS — M545 Low back pain: Secondary | ICD-10-CM | POA: Insufficient documentation

## 2017-09-09 DIAGNOSIS — R400 Somnolence: Secondary | ICD-10-CM | POA: Diagnosis not present

## 2017-09-09 HISTORY — PX: CARPAL TUNNEL RELEASE: SHX101

## 2017-09-09 SURGERY — CARPAL TUNNEL RELEASE
Anesthesia: Monitor Anesthesia Care | Site: Hand | Laterality: Right

## 2017-09-09 MED ORDER — PROPOFOL 10 MG/ML IV BOLUS
INTRAVENOUS | Status: AC
Start: 2017-09-09 — End: ?
  Filled 2017-09-09: qty 20

## 2017-09-09 MED ORDER — OXYCODONE HCL 5 MG PO TABS: 5.0000 mg | ORAL_TABLET | Freq: Four times a day (QID) | ORAL | 0 refills | Status: AC | PRN

## 2017-09-09 MED ORDER — MIDAZOLAM HCL 5 MG/5ML IJ SOLN
INTRAMUSCULAR | Status: DC | PRN
  Administered 2017-09-09: 2 mg via INTRAVENOUS

## 2017-09-09 MED ORDER — DEXAMETHASONE SODIUM PHOSPHATE 10 MG/ML IJ SOLN
INTRAMUSCULAR | Status: AC
  Filled 2017-09-09: qty 1

## 2017-09-09 MED ORDER — DEXAMETHASONE SODIUM PHOSPHATE 10 MG/ML IJ SOLN
INTRAMUSCULAR | Status: DC | PRN
  Administered 2017-09-09: 10 mg via INTRAVENOUS

## 2017-09-09 MED ORDER — BUPIVACAINE HCL (PF) 0.25 % IJ SOLN
INTRAMUSCULAR | Status: DC | PRN
  Administered 2017-09-09: 30 mL

## 2017-09-09 MED ORDER — PROMETHAZINE HCL 25 MG/ML IJ SOLN: 6.2500 mg | INTRAMUSCULAR | Status: DC | PRN

## 2017-09-09 MED ORDER — CEFAZOLIN SODIUM-DEXTROSE 2-4 GM/100ML-% IV SOLN
INTRAVENOUS | Status: AC
  Filled 2017-09-09: qty 100

## 2017-09-09 MED ORDER — EPHEDRINE SULFATE 50 MG/ML IJ SOLN
INTRAMUSCULAR | Status: DC | PRN
  Administered 2017-09-09: 10 mg via INTRAVENOUS

## 2017-09-09 MED ORDER — BUPIVACAINE HCL (PF) 0.25 % IJ SOLN
INTRAMUSCULAR | Status: AC
  Filled 2017-09-09: qty 30

## 2017-09-09 MED ORDER — GLYCOPYRROLATE 0.2 MG/ML IJ SOLN
INTRAMUSCULAR | Status: DC | PRN
  Administered 2017-09-09: 0.1 mg via INTRAVENOUS

## 2017-09-09 MED ORDER — LIDOCAINE HCL (CARDIAC) PF 100 MG/5ML IV SOSY
PREFILLED_SYRINGE | INTRAVENOUS | Status: DC | PRN
  Administered 2017-09-09: 100 mg via INTRAVENOUS

## 2017-09-09 MED ORDER — HYDROMORPHONE HCL 2 MG/ML IJ SOLN
0.2500 mg | INTRAMUSCULAR | Status: DC | PRN
  Administered 2017-09-09: 0.5 mg via INTRAVENOUS

## 2017-09-09 MED ORDER — HYDROMORPHONE HCL 2 MG/ML IJ SOLN
INTRAMUSCULAR | Status: AC
  Filled 2017-09-09: qty 1

## 2017-09-09 MED ORDER — CEFAZOLIN SODIUM-DEXTROSE 2-4 GM/100ML-% IV SOLN
2.0000 g | INTRAVENOUS | Status: AC
  Administered 2017-09-09: 2 g via INTRAVENOUS

## 2017-09-09 MED ORDER — FENTANYL CITRATE (PF) 250 MCG/5ML IJ SOLN
INTRAMUSCULAR | Status: AC
  Filled 2017-09-09: qty 5

## 2017-09-09 MED ORDER — FENTANYL CITRATE (PF) 100 MCG/2ML IJ SOLN
INTRAMUSCULAR | Status: DC | PRN
  Administered 2017-09-09: 50 ug via INTRAVENOUS

## 2017-09-09 MED ORDER — CHLORHEXIDINE GLUCONATE 4 % EX LIQD: 60.0000 mL | Freq: Once | CUTANEOUS | Status: DC

## 2017-09-09 MED ORDER — MIDAZOLAM HCL 2 MG/2ML IJ SOLN
INTRAMUSCULAR | Status: AC
  Filled 2017-09-09: qty 2

## 2017-09-09 MED ORDER — ONDANSETRON HCL 4 MG/2ML IJ SOLN
INTRAMUSCULAR | Status: DC | PRN
  Administered 2017-09-09: 4 mg via INTRAVENOUS

## 2017-09-09 MED ORDER — PROPOFOL 10 MG/ML IV BOLUS
INTRAVENOUS | Status: DC | PRN
  Administered 2017-09-09: 200 mg via INTRAVENOUS

## 2017-09-09 MED ORDER — SCOPOLAMINE 1 MG/3DAYS TD PT72
MEDICATED_PATCH | TRANSDERMAL | Status: AC
  Administered 2017-09-09: 1.5 mg via TRANSDERMAL
  Filled 2017-09-09: qty 1

## 2017-09-09 MED ORDER — SCOPOLAMINE 1 MG/3DAYS TD PT72
1.0000 | MEDICATED_PATCH | TRANSDERMAL | Status: DC
  Administered 2017-09-09: 1.5 mg via TRANSDERMAL

## 2017-09-09 MED ORDER — 0.9 % SODIUM CHLORIDE (POUR BTL) OPTIME
TOPICAL | Status: DC | PRN
  Administered 2017-09-09: 1000 mL

## 2017-09-09 MED ORDER — LACTATED RINGERS IV SOLN
INTRAVENOUS | Status: DC
  Administered 2017-09-09: 11:00:00 via INTRAVENOUS

## 2017-09-09 SURGICAL SUPPLY — 44 items
BANDAGE ACE 3X5.8 VEL STRL LF (GAUZE/BANDAGES/DRESSINGS) ×6 IMPLANT
BANDAGE ACE 4X5 VEL STRL LF (GAUZE/BANDAGES/DRESSINGS) ×3 IMPLANT
BNDG GAUZE ELAST 4 BULKY (GAUZE/BANDAGES/DRESSINGS) ×3 IMPLANT
CORDS BIPOLAR (ELECTRODE) ×3 IMPLANT
COVER SURGICAL LIGHT HANDLE (MISCELLANEOUS) ×3 IMPLANT
CUFF TOURNIQUET SINGLE 18IN (TOURNIQUET CUFF) ×3 IMPLANT
CUFF TOURNIQUET SINGLE 24IN (TOURNIQUET CUFF) IMPLANT
DECANTER SPIKE VIAL GLASS SM (MISCELLANEOUS) ×3 IMPLANT
DRAPE SURG 17X23 STRL (DRAPES) ×3 IMPLANT
EVACUATOR 1/8 PVC DRAIN (DRAIN) IMPLANT
GAUZE SPONGE 4X4 12PLY STRL (GAUZE/BANDAGES/DRESSINGS) ×3 IMPLANT
GAUZE XEROFORM 1X8 LF (GAUZE/BANDAGES/DRESSINGS) ×3 IMPLANT
GLOVE BIOGEL M 8.0 STRL (GLOVE) ×3 IMPLANT
GLOVE SS BIOGEL STRL SZ 8 (GLOVE) ×1 IMPLANT
GLOVE SUPERSENSE BIOGEL SZ 8 (GLOVE) ×2
GOWN STRL REUS W/ TWL LRG LVL3 (GOWN DISPOSABLE) ×2 IMPLANT
GOWN STRL REUS W/ TWL XL LVL3 (GOWN DISPOSABLE) ×3 IMPLANT
GOWN STRL REUS W/TWL LRG LVL3 (GOWN DISPOSABLE) ×4
GOWN STRL REUS W/TWL XL LVL3 (GOWN DISPOSABLE) ×6
KIT BASIN OR (CUSTOM PROCEDURE TRAY) ×3 IMPLANT
KIT TURNOVER KIT B (KITS) ×3 IMPLANT
LOOP VESSEL MAXI BLUE (MISCELLANEOUS) IMPLANT
NEEDLE HYPO 25GX1X1/2 BEV (NEEDLE) IMPLANT
NS IRRIG 1000ML POUR BTL (IV SOLUTION) ×3 IMPLANT
PACK ORTHO EXTREMITY (CUSTOM PROCEDURE TRAY) ×3 IMPLANT
PAD ARMBOARD 7.5X6 YLW CONV (MISCELLANEOUS) ×6 IMPLANT
PAD CAST 4YDX4 CTTN HI CHSV (CAST SUPPLIES) ×2 IMPLANT
PADDING CAST COTTON 4X4 STRL (CAST SUPPLIES) ×4
SCRUB BETADINE 4OZ XXX (MISCELLANEOUS) ×3 IMPLANT
SOL PREP POV-IOD 4OZ 10% (MISCELLANEOUS) ×6 IMPLANT
SPLINT FIBERGLASS 3X12 (CAST SUPPLIES) ×3 IMPLANT
SUT PROLENE 4 0 PS 2 18 (SUTURE) ×3 IMPLANT
SUT VIC AB 2-0 CT1 27 (SUTURE)
SUT VIC AB 2-0 CT1 TAPERPNT 27 (SUTURE) IMPLANT
SUT VIC AB 3-0 FS2 27 (SUTURE) IMPLANT
SYR CONTROL 10ML LL (SYRINGE) IMPLANT
SYSTEM CHEST DRAIN TLS 7FR (DRAIN) IMPLANT
TOWEL OR 17X24 6PK STRL BLUE (TOWEL DISPOSABLE) ×3 IMPLANT
TOWEL OR 17X26 10 PK STRL BLUE (TOWEL DISPOSABLE) ×3 IMPLANT
TUBE CONNECTING 12'X1/4 (SUCTIONS)
TUBE CONNECTING 12X1/4 (SUCTIONS) IMPLANT
TUBE EVACUATION TLS (MISCELLANEOUS) ×3 IMPLANT
UNDERPAD 30X30 (UNDERPADS AND DIAPERS) ×3 IMPLANT
WATER STERILE IRR 1000ML POUR (IV SOLUTION) ×3 IMPLANT

## 2017-09-09 NOTE — Transfer of Care (Signed)
Immediate Anesthesia Transfer of Care Note  Patient: Sabrina [name]  Procedure(s) Performed: RIGHT LIMITED OPEN CARPAL TUNNEL RELEASE (Right Hand)  Patient Location: PACU  Anesthesia Type:General  Level of Consciousness: awake and alert   Airway & Oxygen Therapy: Patient Spontanous Breathing and Patient connected to nasal cannula oxygen  Post-op Assessment: Report given to RN, Post -op Vital signs reviewed and stable and Patient moving all extremities X 4  Post vital signs: Reviewed and stable  Last Vitals:  Vitals Value Taken Time  BP 126/73 09/09/2017  1:43 PM  Temp 36.7 C 09/09/2017  1:43 PM  Pulse 92 09/09/2017  1:44 PM  Resp 17 09/09/2017  1:44 PM  SpO2 100 % 09/09/2017  1:44 PM  Vitals shown include unvalidated device data.  Last Pain:  Vitals:   09/09/17 1343  TempSrc:   PainSc: 0-No pain      Patients Stated Pain Goal: 3 (79/89/21 1941)  Complications: No apparent anesthesia complications

## 2017-09-09 NOTE — Anesthesia Procedure Notes (Signed)
Procedure Name: LMA Insertion Date/Time: 09/09/2017 12:54 PM Performed by: Inda Coke, CRNA Pre-anesthesia Checklist: Patient identified, Emergency Drugs available, Suction available and Patient being monitored Patient Re-evaluated:Patient Re-evaluated prior to induction Oxygen Delivery Method: Circle System Utilized Preoxygenation: Pre-oxygenation with 100% oxygen Induction Type: IV induction Ventilation: Mask ventilation without difficulty LMA: LMA inserted LMA Size: 4.0 Number of attempts: 1 Airway Equipment and Method: Bite block Placement Confirmation: positive ETCO2 and breath sounds checked- equal and bilateral Tube secured with: Tape Dental Injury: Teeth and Oropharynx as per pre-operative assessment
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
## 2017-09-09 NOTE — Anesthesia Preprocedure Evaluation (Addendum)
Anesthesia Evaluation  Patient identified by MRN, date of birth, ID band Patient awake    Reviewed: Allergy & Precautions, NPO status , Patient's Chart, lab work & pertinent test results  History of Anesthesia Complications (+) PONV and history of anesthetic complications  Airway Mallampati: II  TM Distance: >3 FB Neck ROM: Full    Dental  (+) Chipped,    Pulmonary sleep apnea and Continuous Positive Airway Pressure Ventilation ,    Pulmonary exam normal breath sounds clear to auscultation       Cardiovascular hypertension, Pt. on medications Normal cardiovascular exam Rhythm:Regular Rate:Normal  ECG: NSR, rate 75   Neuro/Psych  Headaches, PSYCHIATRIC DISORDERS Anxiety Depression    GI/Hepatic Neg liver ROS, GERD  Medicated and Controlled,  Endo/Other  Morbid obesityMETABOLIC SYNDROME X  Renal/GU negative Renal ROS     Musculoskeletal  (+) Fibromyalgia -Chronic low back pain   Abdominal (+) + obese,   Peds  Hematology HLD   Anesthesia Other Findings Right carpal tunnel syndrome  Reproductive/Obstetrics                            Anesthesia Physical Anesthesia Plan  ASA: III  Anesthesia Plan: MAC   Post-op Pain Management:    Induction: Intravenous  PONV Risk Score and Plan: 2 and Propofol infusion and Treatment may vary due to age or medical condition  Airway Management Planned: Simple Face Mask  Additional Equipment:   Intra-op Plan:   Post-operative Plan:   Informed Consent: I have reviewed the patients History and Physical, chart, labs and discussed the procedure including the risks, benefits and alternatives for the proposed anesthesia with the patient or authorized representative who has indicated his/her understanding and acceptance.   Dental advisory given  Plan Discussed with: CRNA  Anesthesia Plan Comments:        Anesthesia Quick Evaluation

## 2017-09-09 NOTE — Anesthesia Postprocedure Evaluation (Signed)
Anesthesia Post Note  Patient: Sabrina [name]  Procedure(s) Performed: RIGHT LIMITED OPEN CARPAL TUNNEL RELEASE (Right Hand)     Patient location during evaluation: PACU Anesthesia Type: General Level of consciousness: awake and alert Pain management: pain level controlled Vital Signs Assessment: post-procedure vital signs reviewed and stable Respiratory status: spontaneous breathing, nonlabored ventilation, respiratory function stable and patient connected to nasal cannula oxygen Cardiovascular status: blood pressure returned to baseline and stable Postop Assessment: no apparent nausea or vomiting Anesthetic complications: no    Last Vitals:  Vitals:   09/09/17 1421 09/09/17 1435  BP: 121/74 132/81  Pulse: 78 73  Resp: 10 15  Temp: (!) 36.3 C 36.7 C  SpO2: 96% 95%    Last Pain:  Vitals:   09/09/17 1410  TempSrc:   PainSc: Asleep                 Amna Welker P Icey Tello

## 2017-09-09 NOTE — Op Note (Signed)
Sabrina [name], WITHERS MEDICAL RECORD [id number] ACCOUNT [id number] DATE OF BIRTH:06-Mar-1961 FACILITY: [facility] LOCATION: [facility] PHYSICIAN:Winfred Iiams M. Shyrl Obi, MD  OPERATIVE REPORT  DATE OF PROCEDURE:  09/09/2017  PREOPERATIVE DIAGNOSIS:  Right carpal tunnel syndrome.  POSTOPERATIVE DIAGNOSIS:  Right carpal tunnel syndrome.  PROCEDURE:  Right limited open carpal tunnel release.  SURGEON:  Roseanne Kaufman, MD  ASSISTANT:  None.  COMPLICATIONS:  None.  ANESTHESIA:  General.  TOURNIQUET TIME:  Less than 20 minutes.  INDICATIONS:  A 57 year old female with a history of significant arthritic change about the wrist requiring multiple procedures to include fusions of a partial, then complete, nature about the radiocarpal and mid carpal joints.  She has recovered  successfully from this, but has developed significant carpal tunnel symptomatology.  We have discussed with her risks and benefit profile of surgical and nonsurgical avenues of care and she desires to proceed with surgical carpal tunnel release to  hopefully afford improved sensation in the middle finger.  She does have positive electrodiagnostics.  DESCRIPTION OF PROCEDURE:  The patient was seen by myself and anesthesia, taken to the operative theater and underwent smooth induction of general anesthesia.  She was laid supine, given preoperative antibiotics in the form of Ancef and then scrubbed  with Hibiclens scrub.  Following this, Betadine scrub, and paint was accomplished.  Sterile field was then secured.  Timeout observed.  An incision was made 2.5 cm beginning at the distal region of the transverse carpal ligament.  Dissection was carried  down and the patient then underwent very careful and cautious approach to the distal edge of the transverse carpal ligament.  It was released under 4.0 loupe magnification.  Fat pad regressed nicely and complete release was verified distally.  At this  time, I dissected in a distal to  proximal direction till room was available to directly released the proximal leaflet with rhytidectomy scissors and also releasing portions of the antebrachial fascia.  This was done under direct vision.  There were no  complicating features and the median nerve was carefully monitored and kept out of harm's way.  I confirmed full release.  I noted no space occupying lesions, but a very thickened ligament and hyperemic nerve.  We irrigated copiously, irrigated and obtained hemostasis with bipolar electrocautery.  Following this, we then secured the wound with  Prolene and placed a sterile dressing and short arm splint on the patient.  She tolerated this well and there were no complicating features.  Following this, I discussed with the patient elevation, range of motion.  OxyIR was written for pain and we will see her back in the office in 10-14 days for followup.  These notes have  been discussed and all questions have been encouraged and answered.     AN/NUANCE  D:09/09/2017 T:09/09/2017 JOB:[id number]

## 2017-09-09 NOTE — Discharge Instructions (Signed)

## 2017-09-09 NOTE — Op Note (Signed)
[facility] See dictation#[id number] Johnston Maddocks MD

## 2017-09-10 ENCOUNTER — Encounter (HOSPITAL_COMMUNITY): Payer: Self-pay | Admitting: Orthopedic Surgery

## 2017-09-21 ENCOUNTER — Encounter: Payer: Self-pay | Admitting: Family Medicine

## 2017-10-07 ENCOUNTER — Encounter: Payer: Self-pay | Admitting: Family Medicine

## 2017-10-15 ENCOUNTER — Ambulatory Visit (INDEPENDENT_AMBULATORY_CARE_PROVIDER_SITE_OTHER): Payer: BC Managed Care – PPO | Admitting: Family Medicine

## 2017-10-15 ENCOUNTER — Telehealth: Payer: Self-pay | Admitting: Family Medicine

## 2017-10-15 ENCOUNTER — Encounter: Payer: Self-pay | Admitting: Family Medicine

## 2017-10-15 VITALS — BP 122/78 | HR 64 | Temp 97.9°F | Resp 16 | Ht 63.0 in | Wt 242.1 lb

## 2017-10-15 DIAGNOSIS — Z0000 Encounter for general adult medical examination without abnormal findings: Secondary | ICD-10-CM

## 2017-10-15 DIAGNOSIS — F411 Generalized anxiety disorder: Secondary | ICD-10-CM | POA: Diagnosis not present

## 2017-10-15 DIAGNOSIS — Z9189 Other specified personal risk factors, not elsewhere classified: Secondary | ICD-10-CM | POA: Diagnosis not present

## 2017-10-15 DIAGNOSIS — G8929 Other chronic pain: Secondary | ICD-10-CM | POA: Diagnosis not present

## 2017-10-15 DIAGNOSIS — M25532 Pain in left wrist: Secondary | ICD-10-CM | POA: Diagnosis not present

## 2017-10-15 DIAGNOSIS — G894 Chronic pain syndrome: Secondary | ICD-10-CM | POA: Diagnosis not present

## 2017-10-15 DIAGNOSIS — M797 Fibromyalgia: Secondary | ICD-10-CM | POA: Diagnosis not present

## 2017-10-15 DIAGNOSIS — G5793 Unspecified mononeuropathy of bilateral lower limbs: Secondary | ICD-10-CM | POA: Diagnosis not present

## 2017-10-15 DIAGNOSIS — Z79899 Other long term (current) drug therapy: Secondary | ICD-10-CM | POA: Diagnosis not present

## 2017-10-15 DIAGNOSIS — Z1159 Encounter for screening for other viral diseases: Secondary | ICD-10-CM

## 2017-10-15 LAB — LIPID PANEL
Cholesterol: 305 mg/dL — ABNORMAL HIGH (ref 0–200)
HDL: 45.1 mg/dL (ref >39.00)
NonHDL: 259.86
Total CHOL/HDL Ratio: 7
Triglycerides: 308 mg/dL — ABNORMAL HIGH (ref 0.0–149.0)
VLDL: 61.6 mg/dL — ABNORMAL HIGH (ref 0.0–40.0)

## 2017-10-15 LAB — CBC WITH DIFFERENTIAL/PLATELET
Basophils Absolute: 0.1 10*3/uL (ref 0.0–0.1)
Basophils Relative: 1.3 % (ref 0.0–3.0)
Eosinophils Absolute: 0.2 10*3/uL (ref 0.0–0.7)
Eosinophils Relative: 2.7 % (ref 0.0–5.0)
HCT: 39.1 % (ref 36.0–46.0)
Hemoglobin: 13.1 g/dL (ref 12.0–15.0)
LYMPHS ABS: 2.3 10*3/uL (ref 0.7–4.0)
Lymphocytes Relative: 36.2 % (ref 12.0–46.0)
MCHC: 33.5 g/dL (ref 30.0–36.0)
MCV: 81 fl (ref 78.0–100.0)
MONO ABS: 0.6 10*3/uL (ref 0.1–1.0)
MONOS PCT: 9.2 % (ref 3.0–12.0)
NEUTROS PCT: 50.6 % (ref 43.0–77.0)
Neutro Abs: 3.3 10*3/uL (ref 1.4–7.7)
Platelets: 272 10*3/uL (ref 150.0–400.0)
RBC: 4.82 Mil/uL (ref 3.87–5.11)
RDW: 14.8 % (ref 11.5–15.5)
WBC: 6.5 10*3/uL (ref 4.0–10.5)

## 2017-10-15 LAB — COMPREHENSIVE METABOLIC PANEL
ALK PHOS: 90 U/L (ref 39–117)
ALT: 12 U/L (ref 0–35)
AST: 16 U/L (ref 0–37)
Albumin: 4.4 g/dL (ref 3.5–5.2)
BILIRUBIN TOTAL: 0.4 mg/dL (ref 0.2–1.2)
BUN: 19 mg/dL (ref 6–23)
CO2: 28 mEq/L (ref 19–32)
Calcium: 9.4 mg/dL (ref 8.4–10.5)
Chloride: 101 mEq/L (ref 96–112)
Creatinine, Ser: 0.75 mg/dL (ref 0.40–1.20)
GFR: 85.44 mL/min (ref >60.00)
GLUCOSE: 101 mg/dL — AB (ref 70–99)
Potassium: 4.2 mEq/L (ref 3.5–5.1)
SODIUM: 138 meq/L (ref 135–145)
TOTAL PROTEIN: 6.7 g/dL (ref 6.0–8.3)

## 2017-10-15 LAB — TSH: TSH: 0.92 u[IU]/mL (ref 0.35–4.50)

## 2017-10-15 LAB — LDL CHOLESTEROL, DIRECT: Direct LDL: 193 mg/dL

## 2017-10-15 MED ORDER — OXYCODONE HCL 10 MG PO TABS: ORAL_TABLET | ORAL | 0 refills | Status: DC
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
## 2017-10-15 NOTE — Telephone Encounter (Signed)
Noted  

## 2017-10-15 NOTE — Telephone Encounter (Signed)
SW Leda Gauze at Dr. Casimiro Needle office and she stated that Dr. Clovis Pu has not Rx'ed the clonazepam since 2017. She will fax over the last 2 office notes (3/19 and 10/18) along with their current medication list.

## 2017-10-15 NOTE — Telephone Encounter (Signed)
Call Dr. Casimiro Needle office (psychiatrist) and ask for last 2 office notes OR ask/clarify whether or not he has written a rx for pt for clonazepam recently.-thx

## 2017-10-15 NOTE — Patient Instructions (Signed)

## 2017-10-16 LAB — HEPATITIS C ANTIBODY
Hepatitis C Ab: NONREACTIVE
SIGNAL TO CUT-OFF: 0.01 (ref <1.00)

## 2017-10-18 ENCOUNTER — Encounter: Payer: BC Managed Care – PPO | Admitting: Family Medicine

## 2017-10-19 ENCOUNTER — Encounter: Payer: Self-pay | Admitting: Family Medicine

## 2017-10-19 ENCOUNTER — Other Ambulatory Visit: Payer: Self-pay

## 2017-10-19 DIAGNOSIS — E7800 Pure hypercholesterolemia, unspecified: Secondary | ICD-10-CM

## 2017-10-19 MED ORDER — ATORVASTATIN CALCIUM 40 MG PO TABS: 40.0000 mg | ORAL_TABLET | Freq: Every day | ORAL | 2 refills | Status: DC

## 2017-10-19 NOTE — Progress Notes (Signed)
Lab order for Lipid placed, Atorvastatin sent to pharmacy as ordered.

## 2017-11-30 ENCOUNTER — Ambulatory Visit: Payer: BC Managed Care – PPO | Admitting: Podiatry

## 2017-11-30 ENCOUNTER — Encounter: Payer: Self-pay | Admitting: Podiatry

## 2017-11-30 DIAGNOSIS — G629 Polyneuropathy, unspecified: Secondary | ICD-10-CM

## 2017-11-30 DIAGNOSIS — G894 Chronic pain syndrome: Secondary | ICD-10-CM

## 2017-11-30 DIAGNOSIS — M79675 Pain in left toe(s): Secondary | ICD-10-CM

## 2017-11-30 DIAGNOSIS — M79674 Pain in right toe(s): Secondary | ICD-10-CM | POA: Diagnosis not present

## 2017-12-01 NOTE — Progress Notes (Signed)
Subjective: 57 year old female presents the office today for follow-up evaluation of neuropathy as well as pain to her toenails on both sides.  She states that also when she does a lot of walking or sedation there is some redness to her feet mostly to her toes and the ends as well as the bottom of her foot.  She denies any claudication symptoms.  Since I last saw her she did follow-up with [place] [facility] and she will will be likely placed on a trial for the spinal cord stimulator.  She is concerned about the thickening to her toenails.  Denies any systemic complaints such as fevers, chills, nausea, vomiting. No acute changes since last appointment, and no other complaints at this time.   Objective: AAO x3, NAD DP/PT pulses palpable bilaterally, CRT less than 3 seconds Nails appear to be somewhat dystrophic and hypertrophic.  They are not significantly elongated.  There is no surrounding erythema there is no drainage or pus.  Subjectively there is tenderness all the nails.  She showed me pictures of her toes turning red when she does a lot of walking around this is related to the toenails.  There is no other area tenderness.  There is no open sores. No open lesions or pre-ulcerative lesions.  No pain with calf compression, swelling, warmth, erythema  Assessment: Onychomycosis, neuropathy  Plan: -All treatment options discussed with the patient including all alternatives, risks, complications.  -Today we did smooth her toenails to help with the thickening.  I also did give her some urea cream to help thin the toenails.  Follow-up with neurosurgery for spinal cord stimulator possibility. -Patient encouraged to call the office with any questions, concerns, change in symptoms.   Trula Slade DPM

## 2017-12-09 ENCOUNTER — Encounter: Payer: Self-pay | Admitting: Family Medicine

## 2017-12-09 ENCOUNTER — Ambulatory Visit: Payer: BC Managed Care – PPO | Admitting: Family Medicine

## 2017-12-09 VITALS — BP 124/83 | HR 76 | Temp 98.1°F | Resp 16 | Ht 63.0 in | Wt 236.0 lb

## 2017-12-09 DIAGNOSIS — H60311 Diffuse otitis externa, right ear: Secondary | ICD-10-CM | POA: Diagnosis not present

## 2017-12-09 MED ORDER — NEOMYCIN-POLYMYXIN-HC 3.5-10000-1 OT SOLN: 4.0000 [drp] | Freq: Three times a day (TID) | OTIC | 1 refills | Status: DC
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
## 2017-12-19 ENCOUNTER — Encounter: Payer: Self-pay | Admitting: Family Medicine

## 2017-12-21 ENCOUNTER — Other Ambulatory Visit: Payer: Self-pay | Admitting: Neurology

## 2017-12-21 DIAGNOSIS — M792 Neuralgia and neuritis, unspecified: Secondary | ICD-10-CM

## 2018-01-04 ENCOUNTER — Other Ambulatory Visit: Payer: Self-pay | Admitting: Family Medicine

## 2018-01-04 NOTE — Telephone Encounter (Signed)
Refill request from [facility] for Celebrex,  This prescription was d/c in January.  I left message for patient to CB to see if she requested this RX and for what reason since it has been discontinued.   Okay for [facility] to collect information.

## 2018-01-07 NOTE — Telephone Encounter (Signed)
Patient states that she is taking celebrex for fibromyalgia QD and she states that her she was told to stop for wrist surgery but has been taking it since and would like refill.  Please advise.

## 2018-01-08 ENCOUNTER — Other Ambulatory Visit: Payer: Self-pay | Admitting: Family Medicine

## 2018-01-10 NOTE — Telephone Encounter (Signed)
RF request for methocarbamol LOV: 10/15/17 Next ov:  02/15/18 Last written: 06/28/17 #180 1RF  Please advise. Thanks.

## 2018-01-17 ENCOUNTER — Ambulatory Visit: Payer: BC Managed Care – PPO | Admitting: Family Medicine

## 2018-01-21 ENCOUNTER — Other Ambulatory Visit: Payer: Self-pay | Admitting: Family Medicine

## 2018-01-28 ENCOUNTER — Ambulatory Visit: Payer: BC Managed Care – PPO | Admitting: Family Medicine

## 2018-01-30 ENCOUNTER — Other Ambulatory Visit: Payer: Self-pay | Admitting: Family Medicine

## 2018-02-15 ENCOUNTER — Encounter: Payer: Self-pay | Admitting: *Deleted

## 2018-02-15 ENCOUNTER — Ambulatory Visit (INDEPENDENT_AMBULATORY_CARE_PROVIDER_SITE_OTHER): Payer: BC Managed Care – PPO | Admitting: Family Medicine

## 2018-02-15 ENCOUNTER — Encounter: Payer: Self-pay | Admitting: Family Medicine

## 2018-02-15 VITALS — BP 117/75 | HR 63 | Temp 98.0°F | Resp 16 | Ht 63.0 in | Wt 220.2 lb

## 2018-02-15 DIAGNOSIS — R3915 Urgency of urination: Secondary | ICD-10-CM

## 2018-02-15 DIAGNOSIS — M25531 Pain in right wrist: Secondary | ICD-10-CM

## 2018-02-15 DIAGNOSIS — M792 Neuralgia and neuritis, unspecified: Secondary | ICD-10-CM

## 2018-02-15 DIAGNOSIS — M545 Low back pain, unspecified: Secondary | ICD-10-CM

## 2018-02-15 DIAGNOSIS — R350 Frequency of micturition: Secondary | ICD-10-CM

## 2018-02-15 DIAGNOSIS — E7800 Pure hypercholesterolemia, unspecified: Secondary | ICD-10-CM

## 2018-02-15 DIAGNOSIS — G894 Chronic pain syndrome: Secondary | ICD-10-CM

## 2018-02-15 DIAGNOSIS — M797 Fibromyalgia: Secondary | ICD-10-CM

## 2018-02-15 DIAGNOSIS — G8929 Other chronic pain: Secondary | ICD-10-CM

## 2018-02-15 DIAGNOSIS — Z23 Encounter for immunization: Secondary | ICD-10-CM

## 2018-02-15 LAB — POCT URINALYSIS DIPSTICK
GLUCOSE UA: NEGATIVE
Ketones, UA: NEGATIVE
Leukocytes, UA: NEGATIVE
Nitrite, UA: NEGATIVE
Protein, UA: POSITIVE — AB
RBC UA: NEGATIVE
Spec Grav, UA: >=1.03 — AB (ref 1.010–1.025)
Urobilinogen, UA: 0.2 E.U./dL
pH, UA: 6 (ref 5.0–8.0)

## 2018-02-15 LAB — LIPID PANEL
CHOL/HDL RATIO: 4
CHOLESTEROL: 186 mg/dL (ref 0–200)
HDL: 45.3 mg/dL (ref >39.00)
LDL CALC: 110 mg/dL — AB (ref 0–99)
NONHDL: 141.17
Triglycerides: 158 mg/dL — ABNORMAL HIGH (ref 0.0–149.0)
VLDL: 31.6 mg/dL (ref 0.0–40.0)
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
## 2018-02-17 ENCOUNTER — Other Ambulatory Visit: Payer: Self-pay

## 2018-02-17 ENCOUNTER — Other Ambulatory Visit: Payer: Self-pay | Admitting: Family Medicine

## 2018-02-17 MED ORDER — CLONAZEPAM 1 MG PO TABS: 1.0000 mg | ORAL_TABLET | Freq: Three times a day (TID) | ORAL | 5 refills | Status: DC | PRN

## 2018-02-19 LAB — PAIN MGMT, PROFILE 8 W/CONF, U
6 Acetylmorphine: NEGATIVE ng/mL (ref <10)
ALPHAHYDROXYALPRAZOLAM: NEGATIVE ng/mL (ref <25)
ALPHAHYDROXYTRIAZOLAM: NEGATIVE ng/mL (ref <50)
Alcohol Metabolites: NEGATIVE ng/mL (ref <500)
Alphahydroxymidazolam: NEGATIVE ng/mL (ref <50)
Aminoclonazepam: 970 ng/mL — ABNORMAL HIGH (ref <25)
Amphetamines: NEGATIVE ng/mL (ref <500)
Benzodiazepines: POSITIVE ng/mL — AB (ref <100)
Buprenorphine, Urine: NEGATIVE ng/mL (ref <5)
Buprenorphine: NEGATIVE ng/mL (ref <2)
COCAINE METABOLITE: NEGATIVE ng/mL (ref <150)
Creatinine: 234.2 mg/dL
HYDROXYETHYLFLURAZEPAM: NEGATIVE ng/mL (ref <50)
Lorazepam: NEGATIVE ng/mL (ref <50)
MDMA: NEGATIVE ng/mL (ref <500)
Marijuana Metabolite: NEGATIVE ng/mL (ref <20)
NORBUPRENORPHINE: NEGATIVE ng/mL (ref <2)
NORDIAZEPAM: NEGATIVE ng/mL (ref <50)
OPIATES: NEGATIVE ng/mL (ref <100)
Oxazepam: NEGATIVE ng/mL (ref <50)
Oxidant: NEGATIVE ug/mL (ref <200)
Oxycodone: NEGATIVE ng/mL (ref <100)
PH: 6.44 (ref 4.5–9.0)
Temazepam: NEGATIVE ng/mL (ref <50)

## 2018-02-19 LAB — URINE CULTURE
MICRO NUMBER: 91240154
SPECIMEN QUALITY: ADEQUATE

## 2018-03-02 ENCOUNTER — Encounter: Payer: Self-pay | Admitting: Emergency Medicine

## 2018-03-02 DIAGNOSIS — F988 Other specified behavioral and emotional disorders with onset usually occurring in childhood and adolescence: Secondary | ICD-10-CM | POA: Insufficient documentation

## 2018-03-10 ENCOUNTER — Other Ambulatory Visit: Payer: Self-pay | Admitting: Family Medicine

## 2018-03-16 ENCOUNTER — Ambulatory Visit: Payer: Self-pay | Admitting: Psychiatry

## 2018-04-11 ENCOUNTER — Other Ambulatory Visit: Payer: Self-pay | Admitting: Neurology

## 2018-04-11 DIAGNOSIS — M792 Neuralgia and neuritis, unspecified: Secondary | ICD-10-CM

## 2018-05-18 ENCOUNTER — Ambulatory Visit: Payer: Self-pay | Admitting: Family Medicine

## 2018-05-25 ENCOUNTER — Telehealth: Payer: Self-pay

## 2018-05-25 MED ORDER — SERTRALINE HCL 100 MG PO TABS: 200.0000 mg | ORAL_TABLET | Freq: Every day | ORAL | 0 refills | Status: DC

## 2018-05-25 NOTE — Telephone Encounter (Signed)
Pt called requesting refill on Sertraline 100 mg 2/day, asking if she can hold off on appt till she gets STD benefits. Ok per provider. Sent to [facility].

## 2018-07-07 ENCOUNTER — Ambulatory Visit: Payer: BC Managed Care – PPO | Admitting: Podiatry

## 2018-07-07 ENCOUNTER — Encounter: Payer: Self-pay | Admitting: Podiatry

## 2018-07-07 DIAGNOSIS — L600 Ingrowing nail: Secondary | ICD-10-CM

## 2018-07-07 DIAGNOSIS — B351 Tinea unguium: Secondary | ICD-10-CM

## 2018-07-07 NOTE — Patient Instructions (Signed)

## 2018-07-07 NOTE — Progress Notes (Signed)
This patient presents the office follow-up for neuropathy on both feet.  She says she has a history of fibro-myalgia and lumbosacral radiculopathy.  She says that she has had nerve conduction velocity test performed .  She continues to have severe neuropathy as well as continued ticking  of her lower extremity.  She says that she has seen Dr. Jacqualyn Posey before who recommended a trial for spinal cord stimulator.  She presents the office today stating that she is having pain out of proportion to the ingrown toenails on both feet.  She presents the office today to be evaluated and treated for her painful ingrowing toenails.    Podiatric Exam: Vascular: dorsalis pedis and posterior tibial pulses are palpable bilateral. Capillary return is immediate. Temperature gradient is WNL. Skin turgor WNL  Sensorium: Normal Semmes Weinstein monofilament test. Normal tactile sensation bilaterally. Nail Exam: Pt has thick disfigured discolored nails with subungual debris hallux nails  B/L.  Marked incurvation medial and lateral borders great toes  B/L Ulcer Exam: There is no evidence of ulcer or pre-ulcerative changes or infection. Orthopedic Exam: Muscle tone and strength are WNL. No limitations in general ROM. No crepitus or effusions noted. Foot type and digits show no abnormalities. Bony prominences are unremarkable. Skin: No Porokeratosis. No infection or ulcer.  Ingrown toenails  Hallux  B/L.  Onychomycosis  B/L  ROV.  Nail surgery.  Treatment options and alternatives discussed.  Recommended permanent phenol matrixectomy and patient agreed.  Hallux  was prepped with alcohol and a toe block of 3cc of 2% lidocaine plain was administered in a digital toe block. .  The toe was then prepped with betadine solution . A tourniquet was applied to toe. The offending nail border was then excised and matrix tissue exposed.  Phenol was then applied to the matrix tissue followed by an alcohol wash.  Antibiotic ointment and a dry  sterile dressing was applied.  The patient was dispensed instructions for aftercare.  RTC 1 week in [place].     Gardiner Barefoot DPM

## 2018-07-08 ENCOUNTER — Telehealth: Payer: Self-pay | Admitting: *Deleted

## 2018-07-08 ENCOUNTER — Encounter: Payer: Self-pay | Admitting: Psychiatry

## 2018-07-08 ENCOUNTER — Ambulatory Visit (INDEPENDENT_AMBULATORY_CARE_PROVIDER_SITE_OTHER): Payer: BC Managed Care – PPO | Admitting: Psychiatry

## 2018-07-08 DIAGNOSIS — F331 Major depressive disorder, recurrent, moderate: Secondary | ICD-10-CM

## 2018-07-08 DIAGNOSIS — F900 Attention-deficit hyperactivity disorder, predominantly inattentive type: Secondary | ICD-10-CM

## 2018-07-08 DIAGNOSIS — F411 Generalized anxiety disorder: Secondary | ICD-10-CM

## 2018-07-08 MED ORDER — SERTRALINE HCL 100 MG PO TABS: 200.0000 mg | ORAL_TABLET | Freq: Every day | ORAL | 1 refills | Status: DC

## 2018-07-08 MED ORDER — CLONAZEPAM 1 MG PO TABS: 1.0000 mg | ORAL_TABLET | Freq: Three times a day (TID) | ORAL | 5 refills | Status: DC | PRN

## 2018-07-08 NOTE — Telephone Encounter (Signed)
Unable to leave a message on work phone [employer] stated pt no longer worked there.
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
## 2018-07-08 NOTE — Telephone Encounter (Signed)
Dr. Prudence Davidson requested courtesy call to check pt's status after toenail procedure. Left message on Home/Mobile phone informing pt Dr. Prudence Davidson had wanted to check her status after the toenail procedure yesterday and to call again and feel free to leave a message on [phone].

## 2018-07-13 ENCOUNTER — Ambulatory Visit (INDEPENDENT_AMBULATORY_CARE_PROVIDER_SITE_OTHER): Payer: BC Managed Care – PPO | Admitting: Podiatry

## 2018-07-13 ENCOUNTER — Encounter: Payer: Self-pay | Admitting: Podiatry

## 2018-07-13 ENCOUNTER — Other Ambulatory Visit: Payer: Self-pay

## 2018-07-13 DIAGNOSIS — Z09 Encounter for follow-up examination after completed treatment for conditions other than malignant neoplasm: Secondary | ICD-10-CM

## 2018-07-13 NOTE — Progress Notes (Signed)
.  This patient returns to the office following nail surgery one week ago.  The patient says toe has been soaked and bandaged as directed.  There has been improvement of the toe since the surgery has been performed. The patient presents for continued evaluation and treatment.  GENERAL APPEARANCE: Alert, conversant. Appropriately groomed. No acute distress.  VASCULAR: Pedal pulses palpable at  [facility] and PT bilateral.  Capillary refill time is immediate to all digits,  Normal temperature gradient.    NEUROLOGIC: sensation is normal to 5.07 monofilament at 5/5 sites bilateral.  Light touch is intact bilateral, Muscle strength normal.  MUSCULOSKELETAL: acceptable muscle strength, tone and stability bilateral.  Intrinsic muscluature intact bilateral.  Rectus appearance of foot and digits noted bilateral.   DERMATOLOGIC: skin color, texture, and turgor are within normal limits.  No preulcerative lesions or ulcers  are seen, no interdigital maceration noted.   NAILS  There is necrotic tissue along the nail groove  In the absence of redness swelling and pain.  DX  S/p nail surgery  ROV  Home instructions were discussed.  Patient to call the office if there are any questions or concerns. RTC in future for nail surgery left foot.   Gardiner Barefoot DPM

## 2018-07-19 ENCOUNTER — Telehealth: Payer: Self-pay | Admitting: Family Medicine

## 2018-07-19 NOTE — Telephone Encounter (Signed)
Copied from [place] [id number] [id number]. Topic: Quick Communication - Rx Refill/Question >> Jul 19, 2018 12:25 PM Waylan Rocher, [place] L wrote: Medication: celecoxib (CELEBREX) 200 MG capsule ([facility] told her to ask if this can be filled early because patient cares for her 57 year old mother and she doesn't want to have to make two trips to the pharmacy as there are medications ready for her at [facility] now)  Has the patient contacted their pharmacy? Yes.   (Agent: If no, request that the patient contact the pharmacy for the refill.) (Agent: If yes, when and what did the pharmacy advise?)  Preferred Pharmacy (with phone number or street name): [facility] [address] [place] [address] [place] Sle[place] [address] Phone: [phone] Fax: [phone]   Agent: Please be advised that RX refills may take up to 3 business days. We ask that you follow-up with your pharmacy.

## 2018-07-20 NOTE — Telephone Encounter (Signed)
Yes , okay to fill early

## 2018-07-21 NOTE — Telephone Encounter (Signed)
Pharmacy advised, [facility] for pt to pick up RX.

## 2018-07-25 ENCOUNTER — Ambulatory Visit: Payer: Self-pay | Admitting: Podiatry

## 2018-11-01 ENCOUNTER — Encounter: Payer: Self-pay | Admitting: Family Medicine

## 2018-11-08 ENCOUNTER — Other Ambulatory Visit: Payer: Self-pay

## 2018-11-08 MED ORDER — LOSARTAN POTASSIUM-HCTZ 100-25 MG PO TABS: 1.0000 | ORAL_TABLET | Freq: Every day | ORAL | 0 refills | Status: DC

## 2018-12-06 ENCOUNTER — Other Ambulatory Visit: Payer: Self-pay

## 2018-12-06 MED ORDER — ATORVASTATIN CALCIUM 40 MG PO TABS: 40.0000 mg | ORAL_TABLET | Freq: Every day | ORAL | 6 refills | Status: DC

## 2018-12-06 MED ORDER — LOSARTAN POTASSIUM-HCTZ 100-25 MG PO TABS: 1.0000 | ORAL_TABLET | Freq: Every day | ORAL | 0 refills | Status: DC

## 2019-01-10 ENCOUNTER — Ambulatory Visit: Payer: BC Managed Care – PPO | Admitting: Psychiatry

## 2019-01-24 ENCOUNTER — Other Ambulatory Visit: Payer: Self-pay

## 2019-01-24 MED ORDER — CLONAZEPAM 1 MG PO TABS: 1.0000 mg | ORAL_TABLET | Freq: Three times a day (TID) | ORAL | 1 refills | Status: DC | PRN

## 2019-01-31 ENCOUNTER — Telehealth: Payer: Self-pay | Admitting: Family Medicine

## 2019-01-31 MED ORDER — CELECOXIB 200 MG PO CAPS: ORAL_CAPSULE | ORAL | 0 refills | Status: DC

## 2019-01-31 NOTE — Telephone Encounter (Signed)
Pt is requesting refill for Celebrex. She has an appt to establish care with another MD on 03/06/19. Last refill was 01/07/18 (90,3). Pharmacy has been updated.   Please advise, thanks.

## 2019-01-31 NOTE — Telephone Encounter (Signed)
OK, I did #90 with no RF's.

## 2019-01-31 NOTE — Telephone Encounter (Signed)
Patient refill request  celecoxib (CELEBREX) 200 MG capsule [id number]  Please delete [facility] as her preferred pharmacy and change to   [place] # [place], [place]

## 2019-01-31 NOTE — Telephone Encounter (Signed)
MyChart message sent regarding Rx.

## 2019-02-09 ENCOUNTER — Encounter: Payer: Self-pay | Admitting: Psychiatry

## 2019-02-09 ENCOUNTER — Other Ambulatory Visit: Payer: Self-pay

## 2019-02-09 ENCOUNTER — Ambulatory Visit (INDEPENDENT_AMBULATORY_CARE_PROVIDER_SITE_OTHER): Payer: Self-pay | Admitting: Psychiatry

## 2019-02-09 ENCOUNTER — Telehealth: Payer: Self-pay | Admitting: Family Medicine

## 2019-02-09 DIAGNOSIS — F411 Generalized anxiety disorder: Secondary | ICD-10-CM

## 2019-02-09 DIAGNOSIS — F900 Attention-deficit hyperactivity disorder, predominantly inattentive type: Secondary | ICD-10-CM

## 2019-02-09 DIAGNOSIS — F331 Major depressive disorder, recurrent, moderate: Secondary | ICD-10-CM

## 2019-02-09 DIAGNOSIS — G4721 Circadian rhythm sleep disorder, delayed sleep phase type: Secondary | ICD-10-CM

## 2019-02-09 MED ORDER — ZOLPIDEM TARTRATE 5 MG PO TABS: 5.0000 mg | ORAL_TABLET | Freq: Every evening | ORAL | 0 refills | Status: DC | PRN

## 2019-02-09 NOTE — Telephone Encounter (Signed)
Patient walked into office. She is without insurance right now. She is requesting a visit with Dr. Anitra Lauth and for him to give her a shot of Cortizone in her shoulder.  Cannot afford xrays or going to see orthopaedists right now, just needs for shoulder pain and nothing OTC is working.   Please call patient to discuss [phone].
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
## 2019-02-10 NOTE — Telephone Encounter (Signed)
Pt's last appt was 02/15/18 and will be establishing with Dr.Mulberry on 03/06/19. Patient may be able to be seen next week after checking schedule.  Please advise, thanks.

## 2019-02-10 NOTE — Telephone Encounter (Signed)
OK for visit for shoulder pain next week.

## 2019-02-10 NOTE — Telephone Encounter (Signed)
LM to offer pt an appt next week for shoulder pain.

## 2019-02-15 ENCOUNTER — Ambulatory Visit: Payer: Self-pay | Admitting: Family Medicine

## 2019-02-21 NOTE — Telephone Encounter (Signed)
Patient has decided not to go to [facility] [employer] [facility] since her insurance is not active. She is going to "tough it out". Patient also has been exposed to COVID-19. Patient is going to the health department to be tested.

## 2019-02-21 NOTE — Telephone Encounter (Signed)
OK, noted. What is [facility]?

## 2019-02-21 NOTE — Telephone Encounter (Signed)
FYI  Please see below

## 2019-02-22 NOTE — Telephone Encounter (Signed)
It is a Scient[facility], research (physical sciences) clinic

## 2019-02-22 NOTE — Telephone Encounter (Signed)
Noted  

## 2019-02-28 ENCOUNTER — Other Ambulatory Visit: Payer: Self-pay | Admitting: Family Medicine

## 2019-03-06 ENCOUNTER — Ambulatory Visit: Payer: Self-pay | Admitting: Internal Medicine

## 2019-03-13 ENCOUNTER — Other Ambulatory Visit: Payer: Self-pay | Admitting: Psychiatry

## 2019-03-13 NOTE — Telephone Encounter (Signed)
Apt 08/07/2019 refills?

## 2019-03-31 ENCOUNTER — Other Ambulatory Visit: Payer: Self-pay | Admitting: Psychiatry

## 2019-04-03 ENCOUNTER — Other Ambulatory Visit: Payer: Self-pay | Admitting: Family Medicine

## 2019-04-03 ENCOUNTER — Other Ambulatory Visit: Payer: Self-pay | Admitting: Psychiatry

## 2019-04-07 ENCOUNTER — Telehealth: Payer: Self-pay

## 2019-04-07 MED ORDER — LOSARTAN POTASSIUM-HCTZ 100-25 MG PO TABS: 1.0000 | ORAL_TABLET | Freq: Every day | ORAL | 0 refills | Status: DC

## 2019-04-07 NOTE — Telephone Encounter (Signed)
Caller stated she has been out of her BP med, has not been on insurance for awhile, has insurance again and needs refill. Will set up f/u visit. She cancelled appt to establish w/ other MD at [facility].

## 2019-04-10 ENCOUNTER — Encounter: Payer: Self-pay | Admitting: Family Medicine

## 2019-04-10 ENCOUNTER — Ambulatory Visit (INDEPENDENT_AMBULATORY_CARE_PROVIDER_SITE_OTHER): Payer: BC Managed Care – PPO | Admitting: Family Medicine

## 2019-04-10 ENCOUNTER — Other Ambulatory Visit: Payer: Self-pay

## 2019-04-10 VITALS — BP 137/83 | HR 84

## 2019-04-10 DIAGNOSIS — G609 Hereditary and idiopathic neuropathy, unspecified: Secondary | ICD-10-CM | POA: Diagnosis not present

## 2019-04-10 DIAGNOSIS — I10 Essential (primary) hypertension: Secondary | ICD-10-CM | POA: Diagnosis not present

## 2019-04-10 DIAGNOSIS — G894 Chronic pain syndrome: Secondary | ICD-10-CM | POA: Diagnosis not present

## 2019-04-10 DIAGNOSIS — E7800 Pure hypercholesterolemia, unspecified: Secondary | ICD-10-CM

## 2019-04-10 DIAGNOSIS — M25531 Pain in right wrist: Secondary | ICD-10-CM

## 2019-04-10 DIAGNOSIS — G8929 Other chronic pain: Secondary | ICD-10-CM

## 2019-04-10 MED ORDER — LOSARTAN POTASSIUM-HCTZ 100-25 MG PO TABS: 1.0000 | ORAL_TABLET | Freq: Every day | ORAL | 3 refills | Status: DC

## 2019-04-10 MED ORDER — METRONIDAZOLE 0.75 % EX GEL: 1.0000 "application " | Freq: Two times a day (BID) | CUTANEOUS | 3 refills | Status: DC

## 2019-04-10 MED ORDER — OMEPRAZOLE 40 MG PO CPDR: 40.0000 mg | DELAYED_RELEASE_CAPSULE | Freq: Two times a day (BID) | ORAL | 3 refills | Status: DC
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
## 2019-04-22 ENCOUNTER — Encounter: Payer: Self-pay | Admitting: Emergency Medicine

## 2019-04-22 ENCOUNTER — Other Ambulatory Visit: Payer: Self-pay

## 2019-04-22 ENCOUNTER — Emergency Department
Admission: EM | Admit: 2019-04-22 | Discharge: 2019-04-22 | Disposition: A | Discharge status: Home / Self Care | Payer: BC Managed Care – PPO | Source: Home / Self Care | Attending: Family Medicine | Admitting: Family Medicine

## 2019-04-22 DIAGNOSIS — Z20828 Contact with and (suspected) exposure to other viral communicable diseases: Secondary | ICD-10-CM

## 2019-04-22 DIAGNOSIS — Z20822 Contact with and (suspected) exposure to covid-19: Secondary | ICD-10-CM

## 2019-04-22 DIAGNOSIS — R0602 Shortness of breath: Secondary | ICD-10-CM | POA: Diagnosis not present

## 2019-04-22 DIAGNOSIS — J069 Acute upper respiratory infection, unspecified: Secondary | ICD-10-CM | POA: Diagnosis not present

## 2019-04-22 MED ORDER — ALBUTEROL SULFATE HFA 108 (90 BASE) MCG/ACT IN AERS: INHALATION_SPRAY | RESPIRATORY_TRACT | 0 refills | Status: DC

## 2019-04-22 MED ORDER — PREDNISONE 20 MG PO TABS: ORAL_TABLET | ORAL | 0 refills | Status: DC

## 2019-04-22 MED ORDER — DOXYCYCLINE HYCLATE 100 MG PO CAPS: 100.0000 mg | ORAL_CAPSULE | Freq: Two times a day (BID) | ORAL | 0 refills | Status: DC
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
## 2019-04-22 NOTE — Discharge Instructions (Addendum)
Take plain guaifenesin (1200mg  extended release tabs such as Mucinex) twice daily, with plenty of water, for cough and congestion.   Get adequate rest.   May use Afrin nasal spray (or generic oxymetazoline) each morning for about 5 days and then discontinue.  Also recommend using saline nasal spray several times daily and saline nasal irrigation (AYR is a common brand).  Use Flonase nasal spray each morning after using Afrin nasal spray and saline nasal irrigation. Try warm salt water gargles for sore throat.  Stop all antihistamines for now, and other non-prescription cough/cold preparations. May take Delsym Cough Suppressant at bedtime for nighttime cough.   Isolate yourself until COVID-19 test result is available.   If your COVID19 test is positive, then you are infected with the novel coronavirus and could give the virus to others.  Please continue isolation at home for at least 10 days since the start of your symptoms. Once you complete your 10 day quarantine, you may return to normal activities as long as you've not had a fever for over 24 hours (without taking fever reducing medicine) and your symptoms are improving. Please continue good preventive care measures, including:  frequent hand-washing, avoid touching your face, cover coughs/sneezes, stay out of crowds and keep a 6 foot distance from others.  Go to the nearest hospital emergency room if fever/cough/breathlessness are severe or illness seems like a threat to life.

## 2019-04-22 NOTE — ED Triage Notes (Signed)
Coughing and fatigue x5 days. States her friend tested positive yesterday.

## 2019-04-25 LAB — NOVEL CORONAVIRUS, NAA: SARS-CoV-2, NAA: NOT DETECTED

## 2019-04-29 IMAGING — DX DG CHEST 2V
2 series · 2 of 2 positions shown · non-contrast
Comparison: Chest x-ray 04/23/2016.

CLINICAL DATA: Cough.  Fever.

EXAM:
CHEST  2 VIEW

[chest pa]
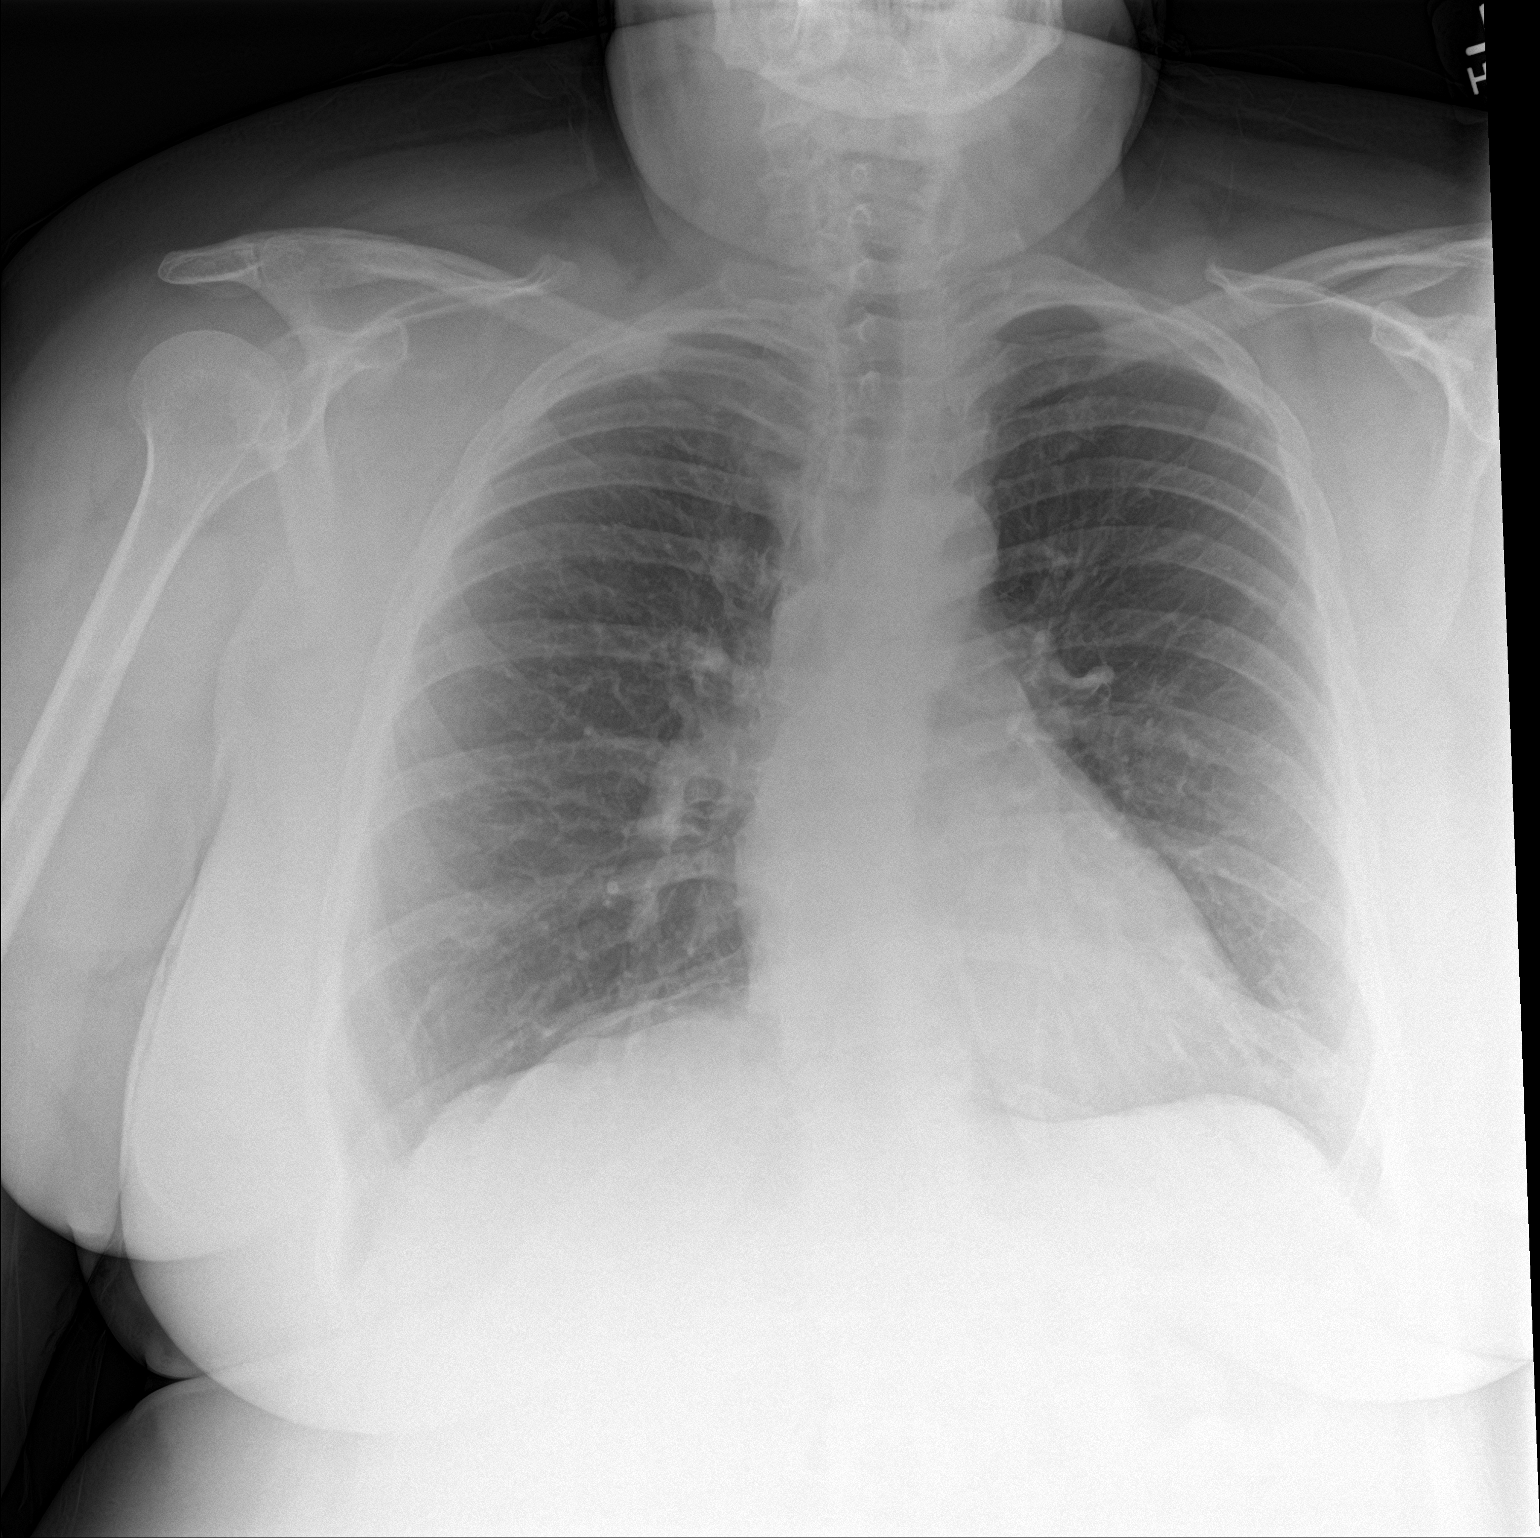

[chest lat]
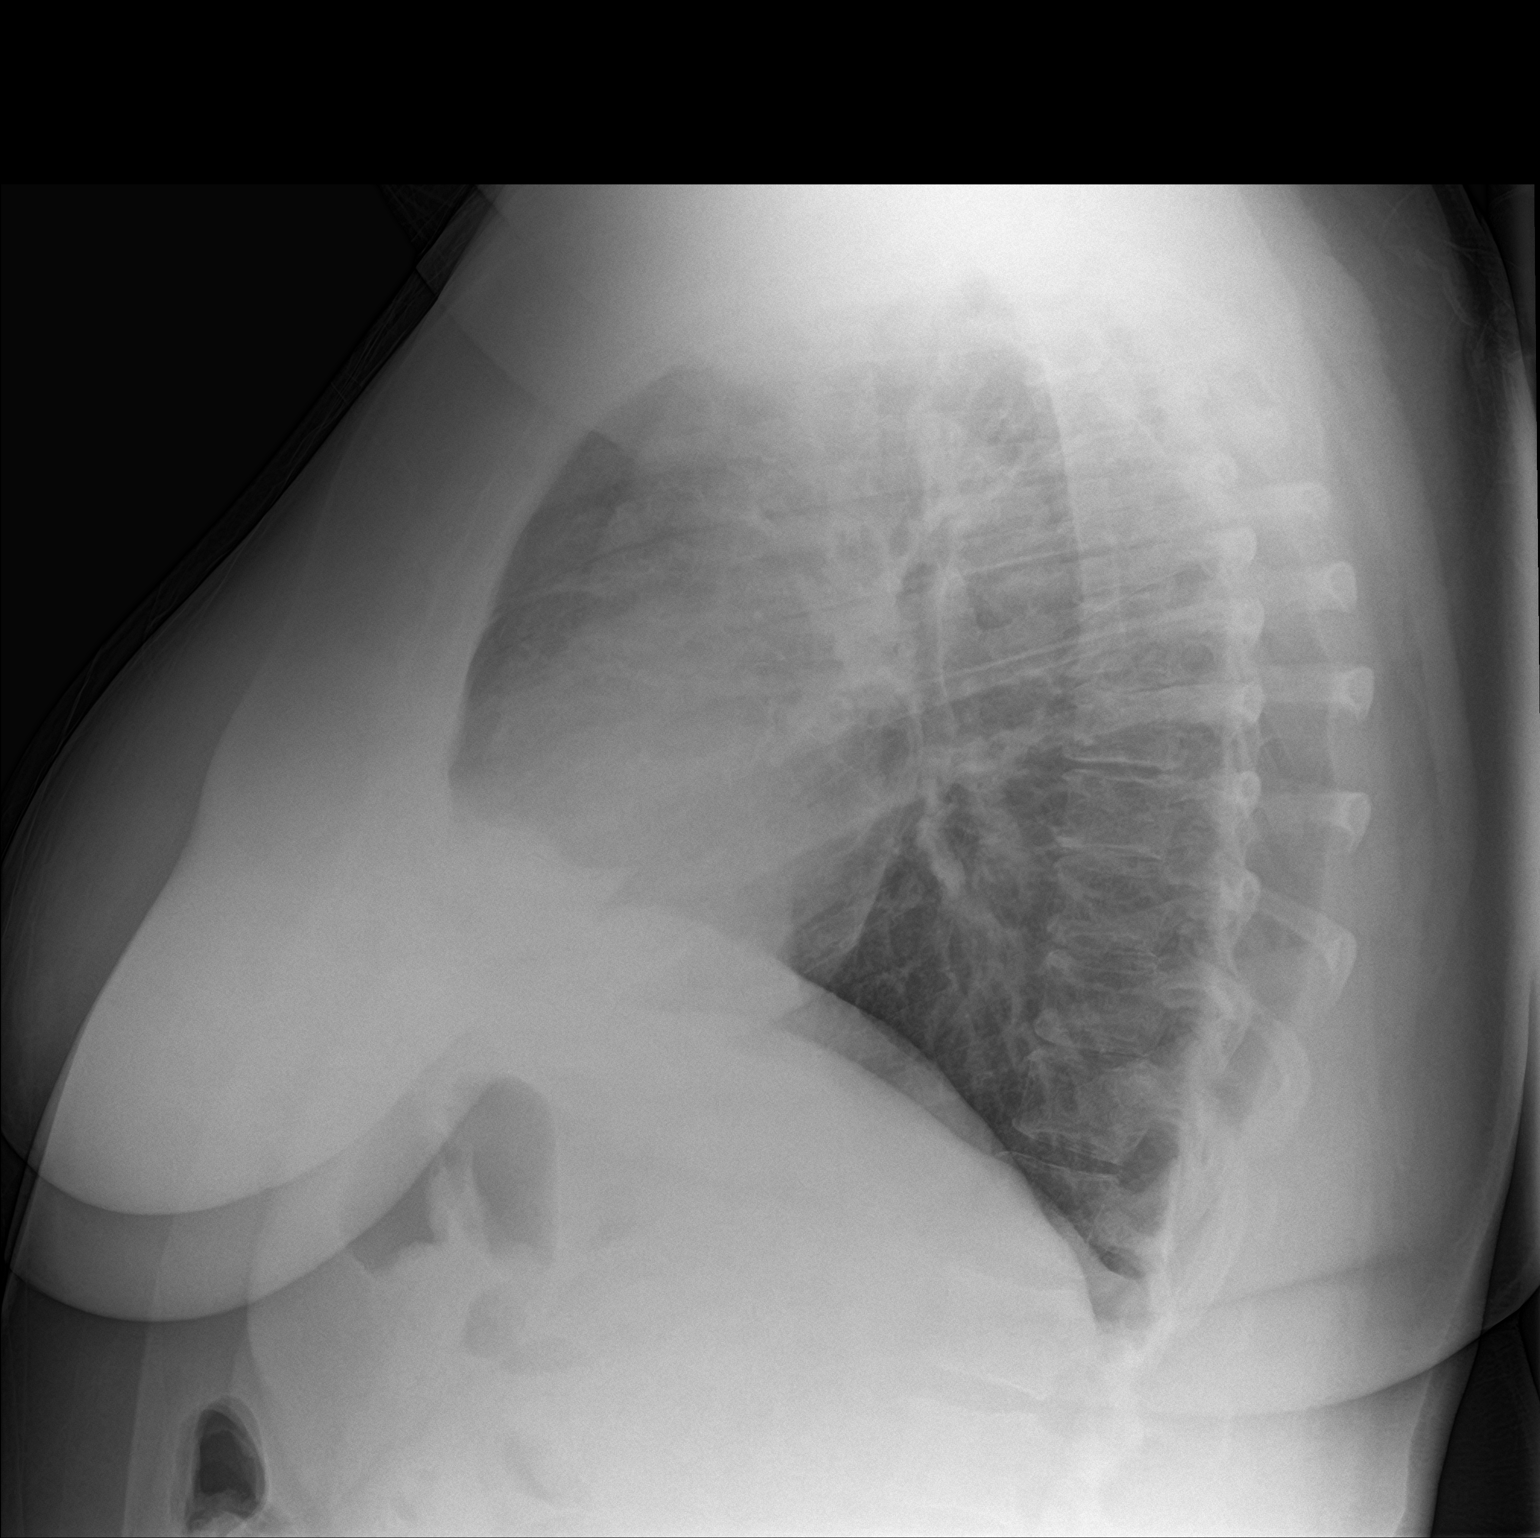

[2 of 2 positions shown; findings below may reference images not displayed]

FINDINGS: Mediastinum and hilar structures are normal. Mild basilar
atelectasis. Heart size normal . No pleural effusion or
pneumothorax. Thoracic spine scoliosis.
IMPRESSION: Low lung volumes with mild basilar atelectasis . No acute
infiltrate.

## 2019-05-03 ENCOUNTER — Telehealth: Payer: Self-pay | Admitting: Family Medicine

## 2019-05-03 NOTE — Telephone Encounter (Signed)
FYI

## 2019-05-03 NOTE — Telephone Encounter (Signed)
Patient states Dr. Anitra Lauth told her to come back for lab work. She wanted to let him know she hasn't forgotten to come by.  Patient reports she was diagnosed with viral and bacterial bronchitis on 12/19. (in [address]) She was tested for COVID, results were negative. She will be going to get tested again because she still has symptoms, short of breath, coughing. Patient reports this is her "typical" bronchitis symptoms but is aware she has symptoms of COVID too.

## 2019-05-03 NOTE — Telephone Encounter (Signed)
Noted  

## 2019-05-07 DIAGNOSIS — Z20828 Contact with and (suspected) exposure to other viral communicable diseases: Secondary | ICD-10-CM | POA: Diagnosis not present

## 2019-05-10 ENCOUNTER — Other Ambulatory Visit: Payer: Self-pay | Admitting: Family Medicine

## 2019-05-10 NOTE — Telephone Encounter (Signed)
RF request for celebrex. Last OV 04/10/2019 No upcoming OV Last RX 01/31/2019 # 90 x 0 RF.  Please advise.

## 2019-05-24 ENCOUNTER — Encounter: Payer: Self-pay | Admitting: Family Medicine

## 2019-05-24 ENCOUNTER — Ambulatory Visit (INDEPENDENT_AMBULATORY_CARE_PROVIDER_SITE_OTHER): Payer: BC Managed Care – PPO | Admitting: Family Medicine

## 2019-05-24 ENCOUNTER — Other Ambulatory Visit: Payer: Self-pay

## 2019-05-24 VITALS — BP 137/81 | HR 74

## 2019-05-24 DIAGNOSIS — J40 Bronchitis, not specified as acute or chronic: Secondary | ICD-10-CM | POA: Diagnosis not present

## 2019-05-24 MED ORDER — PREDNISONE 10 MG PO TABS: ORAL_TABLET | ORAL | 0 refills | Status: DC

## 2019-05-24 MED ORDER — LEVOFLOXACIN 500 MG PO TABS: 500.0000 mg | ORAL_TABLET | Freq: Every day | ORAL | 0 refills | Status: DC
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
## 2019-05-31 ENCOUNTER — Ambulatory Visit (INDEPENDENT_AMBULATORY_CARE_PROVIDER_SITE_OTHER): Payer: BC Managed Care – PPO | Admitting: Family Medicine

## 2019-05-31 ENCOUNTER — Encounter: Payer: Self-pay | Admitting: Family Medicine

## 2019-05-31 ENCOUNTER — Other Ambulatory Visit: Payer: Self-pay

## 2019-05-31 DIAGNOSIS — J40 Bronchitis, not specified as acute or chronic: Secondary | ICD-10-CM | POA: Diagnosis not present

## 2019-05-31 DIAGNOSIS — J069 Acute upper respiratory infection, unspecified: Secondary | ICD-10-CM | POA: Diagnosis not present
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
## 2019-06-01 ENCOUNTER — Other Ambulatory Visit: Payer: Self-pay

## 2019-06-01 MED ORDER — METHOCARBAMOL 750 MG PO TABS: ORAL_TABLET | ORAL | 1 refills | Status: DC

## 2019-06-01 NOTE — Telephone Encounter (Signed)
RF request for Methocarbamol LOV:05/31/19, acute, 04/10/19 for f/u [facility] Next ov: advised to f/u March Last written:01/10/18(180,1)  Please advise. Medication pending. Pharmacy verified.

## 2019-06-28 ENCOUNTER — Telehealth: Payer: Self-pay

## 2019-06-28 NOTE — Telephone Encounter (Signed)
Needs o/v at my next available open slot.

## 2019-06-28 NOTE — Telephone Encounter (Signed)
Please call patient. She is still unable to project her voice properly. She is upset that people are afraid to be around her due to the sound of her voice.

## 2019-06-28 NOTE — Telephone Encounter (Signed)
Pt was called back and she states she is very fatigued, still congested at night and slight cough at night. She finished all her medications that were given at least visit x1 month ago. She is still having to force her voice to be able to speak and feels this is why she is so fatigued at times. Pt said the last ENT did not help her and did not know what to do for her. Pt very upset on the phone and crying, very frustrated.   Please advise (you have no openings until Monday)

## 2019-06-29 ENCOUNTER — Other Ambulatory Visit: Payer: Self-pay | Admitting: Psychiatry

## 2019-06-29 NOTE — Telephone Encounter (Signed)
Pt was called and appt was made  

## 2019-07-03 ENCOUNTER — Other Ambulatory Visit: Payer: Self-pay

## 2019-07-03 ENCOUNTER — Encounter: Payer: Self-pay | Admitting: Family Medicine

## 2019-07-03 ENCOUNTER — Ambulatory Visit (INDEPENDENT_AMBULATORY_CARE_PROVIDER_SITE_OTHER): Payer: BC Managed Care – PPO

## 2019-07-03 ENCOUNTER — Ambulatory Visit (INDEPENDENT_AMBULATORY_CARE_PROVIDER_SITE_OTHER): Payer: BC Managed Care – PPO | Admitting: Family Medicine

## 2019-07-03 VITALS — BP 128/80 | HR 85 | Temp 98.2°F | Resp 16 | Ht 63.0 in | Wt 213.0 lb

## 2019-07-03 DIAGNOSIS — R5382 Chronic fatigue, unspecified: Secondary | ICD-10-CM

## 2019-07-03 DIAGNOSIS — R0600 Dyspnea, unspecified: Secondary | ICD-10-CM

## 2019-07-03 DIAGNOSIS — R0609 Other forms of dyspnea: Secondary | ICD-10-CM

## 2019-07-03 DIAGNOSIS — J383 Other diseases of vocal cords: Secondary | ICD-10-CM

## 2019-07-03 DIAGNOSIS — R05 Cough: Secondary | ICD-10-CM | POA: Diagnosis not present

## 2019-07-03 DIAGNOSIS — R059 Cough, unspecified: Secondary | ICD-10-CM

## 2019-07-03 DIAGNOSIS — J329 Chronic sinusitis, unspecified: Secondary | ICD-10-CM | POA: Diagnosis not present

## 2019-07-03 DIAGNOSIS — R061 Stridor: Secondary | ICD-10-CM

## 2019-07-03 DIAGNOSIS — J3489 Other specified disorders of nose and nasal sinuses: Secondary | ICD-10-CM | POA: Diagnosis not present

## 2019-07-03 DIAGNOSIS — R490 Dysphonia: Secondary | ICD-10-CM

## 2019-07-03 DIAGNOSIS — E7800 Pure hypercholesterolemia, unspecified: Secondary | ICD-10-CM

## 2019-07-03 DIAGNOSIS — R062 Wheezing: Secondary | ICD-10-CM

## 2019-07-03 LAB — CBC WITH DIFFERENTIAL/PLATELET
Basophils Absolute: 0.1 10*3/uL (ref 0.0–0.1)
Basophils Relative: 1.4 % (ref 0.0–3.0)
Eosinophils Absolute: 0.1 10*3/uL (ref 0.0–0.7)
Eosinophils Relative: 2.1 % (ref 0.0–5.0)
HCT: 43.5 % (ref 36.0–46.0)
Hemoglobin: 14.5 g/dL (ref 12.0–15.0)
Lymphocytes Relative: 36.4 % (ref 12.0–46.0)
Lymphs Abs: 1.9 10*3/uL (ref 0.7–4.0)
MCHC: 33.3 g/dL (ref 30.0–36.0)
MCV: 85.1 fl (ref 78.0–100.0)
Monocytes Absolute: 0.6 10*3/uL (ref 0.1–1.0)
Monocytes Relative: 10.6 % (ref 3.0–12.0)
Neutro Abs: 2.6 10*3/uL (ref 1.4–7.7)
Neutrophils Relative %: 49.5 % (ref 43.0–77.0)
Platelets: 329 10*3/uL (ref 150.0–400.0)
RBC: 5.11 Mil/uL (ref 3.87–5.11)
RDW: 13.9 % (ref 11.5–15.5)
WBC: 5.3 10*3/uL (ref 4.0–10.5)

## 2019-07-03 LAB — COMPREHENSIVE METABOLIC PANEL
ALT: 19 U/L (ref 0–35)
AST: 21 U/L (ref 0–37)
Albumin: 4.6 g/dL (ref 3.5–5.2)
Alkaline Phosphatase: 84 U/L (ref 39–117)
BUN: 16 mg/dL (ref 6–23)
CO2: 27 mEq/L (ref 19–32)
Calcium: 9.7 mg/dL (ref 8.4–10.5)
Chloride: 101 mEq/L (ref 96–112)
Creatinine, Ser: 0.85 mg/dL (ref 0.40–1.20)
GFR: 69.14 mL/min (ref >60.00)
Glucose, Bld: 96 mg/dL (ref 70–99)
Potassium: 3.8 mEq/L (ref 3.5–5.1)
Sodium: 139 mEq/L (ref 135–145)
Total Bilirubin: 0.6 mg/dL (ref 0.2–1.2)
Total Protein: 7 g/dL (ref 6.0–8.3)

## 2019-07-03 LAB — LIPID PANEL
Cholesterol: 219 mg/dL — ABNORMAL HIGH (ref 0–200)
HDL: 51.6 mg/dL (ref >39.00)
LDL Cholesterol: 136 mg/dL — ABNORMAL HIGH (ref 0–99)
NonHDL: 167.61
Total CHOL/HDL Ratio: 4
Triglycerides: 159 mg/dL — ABNORMAL HIGH (ref 0.0–149.0)
VLDL: 31.8 mg/dL (ref 0.0–40.0)

## 2019-07-03 LAB — SEDIMENTATION RATE: Sed Rate: 5 mm/hr (ref 0–30)

## 2019-07-03 LAB — C-REACTIVE PROTEIN: CRP: <1 mg/dL (ref 0.5–20.0)

## 2019-07-03 LAB — TSH: TSH: 1.34 u[IU]/mL (ref 0.35–4.50)

## 2019-07-03 MED ORDER — AZELASTINE HCL 0.1 % NA SOLN: 2.0000 | Freq: Two times a day (BID) | NASAL | 12 refills | Status: DC
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
## 2019-07-11 DIAGNOSIS — R490 Dysphonia: Secondary | ICD-10-CM | POA: Diagnosis not present

## 2019-07-11 DIAGNOSIS — G4733 Obstructive sleep apnea (adult) (pediatric): Secondary | ICD-10-CM | POA: Diagnosis not present

## 2019-07-11 DIAGNOSIS — E669 Obesity, unspecified: Secondary | ICD-10-CM | POA: Diagnosis not present

## 2019-08-03 DIAGNOSIS — J385 Laryngeal spasm: Secondary | ICD-10-CM | POA: Diagnosis not present

## 2019-08-03 DIAGNOSIS — R490 Dysphonia: Secondary | ICD-10-CM | POA: Diagnosis not present

## 2019-08-07 ENCOUNTER — Encounter: Payer: Self-pay | Admitting: Family Medicine

## 2019-08-07 ENCOUNTER — Ambulatory Visit: Payer: Self-pay | Admitting: Psychiatry

## 2019-08-14 ENCOUNTER — Other Ambulatory Visit: Payer: Self-pay

## 2019-08-14 NOTE — Telephone Encounter (Signed)
She has tried bathing in epsom salt. Requesting refills for oxycodone and Celebrex.  RF request for celebrex LOV:07/03/19 Next ov: 08/15/19 Last written:05/10/19(90,0)  Requesting: oxycodone Contract:n/a UDS:n/a Last Visit:07/03/19 Next Visit:08/15/19 Last Refill:10/15/17(90,0), d/c on 07/03/19  Please Advise. Medication pending

## 2019-08-14 NOTE — Telephone Encounter (Signed)
Patient made an first available appointment however asked if there was any way she could be seen today. Her back hurts really bad.

## 2019-08-15 ENCOUNTER — Encounter: Payer: Self-pay | Admitting: Family Medicine

## 2019-08-15 ENCOUNTER — Ambulatory Visit (INDEPENDENT_AMBULATORY_CARE_PROVIDER_SITE_OTHER): Payer: BC Managed Care – PPO | Admitting: Family Medicine

## 2019-08-15 ENCOUNTER — Other Ambulatory Visit: Payer: Self-pay

## 2019-08-15 VITALS — BP 127/83 | HR 81 | Temp 98.1°F | Resp 16 | Ht 63.0 in | Wt 213.0 lb

## 2019-08-15 DIAGNOSIS — M545 Low back pain, unspecified: Secondary | ICD-10-CM

## 2019-08-15 DIAGNOSIS — G8929 Other chronic pain: Secondary | ICD-10-CM

## 2019-08-15 MED ORDER — OXYCODONE HCL 10 MG PO TABS: ORAL_TABLET | ORAL | 0 refills | Status: DC
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
## 2019-08-16 MED ORDER — CELECOXIB 200 MG PO CAPS: ORAL_CAPSULE | ORAL | 1 refills | Status: DC

## 2019-08-16 NOTE — Telephone Encounter (Signed)
MyChart message read.

## 2019-08-18 ENCOUNTER — Telehealth: Payer: Self-pay

## 2019-08-18 NOTE — Telephone Encounter (Signed)
Can not go thru the weekend like this, she is in severe pain.  Meds are not helping, she has not heard from PT.  Please call  [phone]

## 2019-08-18 NOTE — Telephone Encounter (Signed)
Patient advised to go ER due to the severity of pain she is in.  She states she thought PT would have already contacted her but they have not.  She states she went walking yesterday and her pain level has increased since.  I advised her again to go to ER.  She seemed hesitant and I'm unsure if she will follow through with plan but was unsure of what else to recommend this late in the day on a Friday.

## 2019-08-24 ENCOUNTER — Ambulatory Visit (INDEPENDENT_AMBULATORY_CARE_PROVIDER_SITE_OTHER): Payer: BC Managed Care – PPO | Admitting: Rehabilitative and Restorative Service Providers"

## 2019-08-24 ENCOUNTER — Other Ambulatory Visit: Payer: Self-pay

## 2019-08-24 DIAGNOSIS — M545 Low back pain, unspecified: Secondary | ICD-10-CM

## 2019-08-24 DIAGNOSIS — R293 Abnormal posture: Secondary | ICD-10-CM

## 2019-08-24 DIAGNOSIS — M6281 Muscle weakness (generalized): Secondary | ICD-10-CM | POA: Diagnosis not present
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
## 2019-08-24 NOTE — Patient Instructions (Signed)
Access Code: [email or web address] URL: [email or web address] Date: 08/24/2019 Prepared by: Rudell Cobb  Exercises Bent Knee Fallouts - 2 x daily - 7 x weekly - 10 reps - 1 sets Supine Hamstring Stretch - 2 x daily - 7 x weekly - 1 sets - 3 reps - 30 seconds hold Hooklying Single Knee to Chest Stretch - 2 x daily - 7 x weekly - 1 sets - 3 reps - 30 seconds hold

## 2019-08-29 ENCOUNTER — Ambulatory Visit (INDEPENDENT_AMBULATORY_CARE_PROVIDER_SITE_OTHER): Payer: BC Managed Care – PPO | Admitting: Physical Therapy

## 2019-08-29 ENCOUNTER — Other Ambulatory Visit: Payer: Self-pay

## 2019-08-29 DIAGNOSIS — R293 Abnormal posture: Secondary | ICD-10-CM | POA: Diagnosis not present

## 2019-08-29 DIAGNOSIS — M545 Low back pain, unspecified: Secondary | ICD-10-CM

## 2019-08-29 DIAGNOSIS — M6281 Muscle weakness (generalized): Secondary | ICD-10-CM
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
## 2019-09-01 ENCOUNTER — Ambulatory Visit: Payer: BC Managed Care – PPO | Admitting: Physical Therapy

## 2019-09-01 ENCOUNTER — Other Ambulatory Visit: Payer: Self-pay

## 2019-09-01 ENCOUNTER — Encounter: Payer: Self-pay | Admitting: Physical Therapy

## 2019-09-01 DIAGNOSIS — R293 Abnormal posture: Secondary | ICD-10-CM

## 2019-09-01 DIAGNOSIS — M6281 Muscle weakness (generalized): Secondary | ICD-10-CM | POA: Diagnosis not present

## 2019-09-01 DIAGNOSIS — M545 Low back pain, unspecified: Secondary | ICD-10-CM

## 2019-09-01 NOTE — Patient Instructions (Signed)
Access Code: [email or web address]: [email or web address]: 04/30/2021Prepared by: Arab Single Knee to Chest Stretch - 2 x daily - 7 x weekly - 1 sets - 3 reps - 30 seconds hold  Hooklying Hamstring Stretch with Strap - 2 x daily - 7 x weekly - 1 sets - 2-3 reps - 20 hold  Seated Table Piriformis Stretch - 2 x daily - 7 x weekly - 1 sets - 3 reps - 20 hold  Supine Bridge - 1 x daily - 7 x weekly - 10 reps - 5 hold  Standing Bilateral Low Shoulder Row with Anchored Resistance - 1 x daily - 7 x weekly - 1 sets - 10 reps  Wall Quarter Squat - 1 x daily - 7 x weekly - 1 sets - 10 reps - 5 hold
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
## 2019-09-05 ENCOUNTER — Encounter: Payer: BC Managed Care – PPO | Admitting: Physical Therapy

## 2019-09-06 ENCOUNTER — Other Ambulatory Visit: Payer: Self-pay | Admitting: Family Medicine

## 2019-09-07 ENCOUNTER — Encounter: Payer: Self-pay | Admitting: Rehabilitative and Restorative Service Providers"

## 2019-09-08 ENCOUNTER — Encounter: Payer: BC Managed Care – PPO | Admitting: Physical Therapy

## 2019-09-12 ENCOUNTER — Encounter: Payer: BC Managed Care – PPO | Admitting: Physical Therapy

## 2019-09-13 ENCOUNTER — Encounter: Payer: BC Managed Care – PPO | Admitting: Physical Therapy

## 2019-09-15 ENCOUNTER — Encounter: Payer: BC Managed Care – PPO | Admitting: Physical Therapy

## 2019-09-17 ENCOUNTER — Other Ambulatory Visit: Payer: Self-pay | Admitting: Psychiatry

## 2019-09-18 NOTE — Telephone Encounter (Signed)
Last visit 10/20 due back April

## 2019-09-22 ENCOUNTER — Other Ambulatory Visit: Payer: Self-pay

## 2019-09-22 MED ORDER — SERTRALINE HCL 100 MG PO TABS: 200.0000 mg | ORAL_TABLET | Freq: Every day | ORAL | 0 refills | Status: DC

## 2019-09-27 ENCOUNTER — Other Ambulatory Visit: Payer: Self-pay

## 2019-09-27 ENCOUNTER — Ambulatory Visit (INDEPENDENT_AMBULATORY_CARE_PROVIDER_SITE_OTHER): Payer: BC Managed Care – PPO | Admitting: Physical Therapy

## 2019-09-27 ENCOUNTER — Encounter: Payer: Self-pay | Admitting: Physical Therapy

## 2019-09-27 DIAGNOSIS — M6281 Muscle weakness (generalized): Secondary | ICD-10-CM

## 2019-09-27 DIAGNOSIS — M545 Low back pain, unspecified: Secondary | ICD-10-CM

## 2019-09-27 DIAGNOSIS — R293 Abnormal posture: Secondary | ICD-10-CM
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
## 2019-09-29 ENCOUNTER — Other Ambulatory Visit: Payer: Self-pay

## 2019-09-29 ENCOUNTER — Encounter: Payer: Self-pay | Admitting: Physical Therapy

## 2019-09-29 ENCOUNTER — Ambulatory Visit (INDEPENDENT_AMBULATORY_CARE_PROVIDER_SITE_OTHER): Payer: BC Managed Care – PPO | Admitting: Physical Therapy

## 2019-09-29 DIAGNOSIS — M6281 Muscle weakness (generalized): Secondary | ICD-10-CM | POA: Diagnosis not present

## 2019-09-29 DIAGNOSIS — M545 Low back pain, unspecified: Secondary | ICD-10-CM

## 2019-09-29 DIAGNOSIS — R293 Abnormal posture: Secondary | ICD-10-CM

## 2019-09-29 NOTE — Patient Instructions (Signed)
TENS UNIT: This is helpful for muscle pain and spasm.   Search and Purchase a TENS 7000 2nd edition at [employer]. It should be less than $30.     TENS unit instructions: Do not shower or bathe with the unit on Turn the unit off before removing electrodes or batteries If the electrodes lose stickiness add a drop of water to the electrodes after they are disconnected from the unit and place on plastic sheet. If you continued to have difficulty, call the TENS unit company to purchase more electrodes. Do not apply lotion on the skin area prior to use. Make sure the skin is clean and dry as this will help prolong the life of the electrodes. After use, always check skin for unusual red areas, rash or other skin difficulties. If there are any skin problems, does not apply electrodes to the same area. Never remove the electrodes from the unit by pulling the wires. Do not use the TENS unit or electrodes other than as directed. Do not change electrode placement without consultating your therapist or physician. Keep 2 fingers with between each electrode. Wear time ratio is 2:1, on to off times.    For example on for 30 minutes off for 15 minutes and then on for 30 minutes off for 15 minutes  Trigger Point Dry Needling  . What is Trigger Point Dry Needling (DN)? o DN is a physical therapy technique used to treat muscle pain and dysfunction. Specifically, DN helps deactivate muscle trigger points (muscle knots).  o A thin filiform needle is used to penetrate the skin and stimulate the underlying trigger point. The goal is for a local twitch response (LTR) to occur and for the trigger point to relax. No medication of any kind is injected during the procedure.   . What Does Trigger Point Dry Needling Feel Like?  o The procedure feels different for each individual patient. Some patients report that they do not actually feel the needle enter the skin and overall the process is not painful. Very mild  bleeding may occur. However, many patients feel a deep cramping in the muscle in which the needle was inserted. This is the local twitch response.   [name] How Will I feel after the treatment? o Soreness is normal, and the onset of soreness may not occur for a few hours. Typically this soreness does not last longer than two days.  o Bruising is uncommon, however; ice can be used to decrease any possible bruising.  o In rare cases feeling tired or nauseous after the treatment is normal. In addition, your symptoms may get worse before they get better, this period will typically not last longer than 24 hours.   . What Can I do After My Treatment? o Increase your hydration by drinking more water for the next 24 hours. o You may place ice or heat on the areas treated that have become sore, however, do not use heat on inflamed or bruised areas. Heat often brings more relief post needling. o You can continue your regular activities, but vigorous activity is not recommended initially after the treatment for 24 hours. o DN is best combined with other physical therapy such as strengthening, stretching, and other therapies.
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
## 2019-10-04 ENCOUNTER — Ambulatory Visit (INDEPENDENT_AMBULATORY_CARE_PROVIDER_SITE_OTHER): Payer: BC Managed Care – PPO | Admitting: Rehabilitative and Restorative Service Providers"

## 2019-10-04 ENCOUNTER — Encounter: Payer: Self-pay | Admitting: Rehabilitative and Restorative Service Providers"

## 2019-10-04 ENCOUNTER — Other Ambulatory Visit: Payer: Self-pay

## 2019-10-04 DIAGNOSIS — M545 Low back pain, unspecified: Secondary | ICD-10-CM

## 2019-10-04 DIAGNOSIS — M6281 Muscle weakness (generalized): Secondary | ICD-10-CM | POA: Diagnosis not present

## 2019-10-04 DIAGNOSIS — R293 Abnormal posture: Secondary | ICD-10-CM

## 2019-10-04 NOTE — Patient Instructions (Signed)
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
## 2019-10-06 ENCOUNTER — Ambulatory Visit (INDEPENDENT_AMBULATORY_CARE_PROVIDER_SITE_OTHER): Payer: BC Managed Care – PPO | Admitting: Rehabilitative and Restorative Service Providers"

## 2019-10-06 ENCOUNTER — Other Ambulatory Visit: Payer: Self-pay

## 2019-10-06 DIAGNOSIS — M6281 Muscle weakness (generalized): Secondary | ICD-10-CM

## 2019-10-06 DIAGNOSIS — M545 Low back pain, unspecified: Secondary | ICD-10-CM

## 2019-10-06 DIAGNOSIS — R293 Abnormal posture: Secondary | ICD-10-CM | POA: Diagnosis not present

## 2019-10-06 NOTE — Patient Instructions (Signed)
Scapula Adduction With Pectorals, Low   Stand in doorframe with palms against frame and arms at 45. Lean forward and squeeze shoulder blades. Hold __30_ seconds. Repeat _3__ times per session. Do _2-3__ sessions per day.   Scapula Adduction With Pectorals, Mid-Range   Stand in doorframe with palms against frame and arms at 90. Lean forward and squeeze shoulder blades. Hold _30__ seconds. Repeat __3_ times per session. Do _2-3__ sessions per day.   \Scapula Adduction With Pectorals, High   Stand in doorframe with palms against frame and arms at 120. Lean forward and squeeze shoulder blades. Hold _30 __ seconds. Repeat _3__ times per session. Do _2-3__ sessions per day.   Scapular Retraction (Standing)    With arms at sides, pinch shoulder blades together.- puling shoulder blades down and back  Repeat __10__ times per set. Do __1-2__ sets per session. Do _1-2___ sessions per day. Can use noodle along spine    Resisted External Rotation: in Neutral - Bilateral   PALMS UP Sit or stand, tubing in both hands, elbows at sides, bent to 90, forearms forward. Pinch shoulder blades together and rotate forearms out. Keep elbows at sides. Repeat __10__ times per set. Do _2-3___ sets per session. Do _1-2___ sessions per day.   Low Row: Standing   Face anchor, feet shoulder width apart. Palms up, pull arms back, squeezing shoulder blades together. Repeat 10__ times per set. Do 2-3__ sets per session. Do 1-2__ sessions per day. Anchor Height: Waist     Strengthening: Resisted Extension   Hold tubing in right hand, arm forward. Pull arm back, elbow straight. Repeat _10___ times per set. Do 2-3____ sets per session. Do 1-2____ sessions per day.   [name]
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
## 2019-10-09 ENCOUNTER — Encounter: Payer: Self-pay | Admitting: Rehabilitative and Restorative Service Providers"

## 2019-10-09 ENCOUNTER — Ambulatory Visit (INDEPENDENT_AMBULATORY_CARE_PROVIDER_SITE_OTHER): Payer: BC Managed Care – PPO | Admitting: Rehabilitative and Restorative Service Providers"

## 2019-10-09 ENCOUNTER — Other Ambulatory Visit: Payer: Self-pay

## 2019-10-09 DIAGNOSIS — M6281 Muscle weakness (generalized): Secondary | ICD-10-CM

## 2019-10-09 DIAGNOSIS — M545 Low back pain, unspecified: Secondary | ICD-10-CM

## 2019-10-09 DIAGNOSIS — R293 Abnormal posture: Secondary | ICD-10-CM | POA: Diagnosis not present

## 2019-10-09 NOTE — Patient Instructions (Signed)
Access Code: [email or web address]: [email or web address]: 06/07/2021Prepared by: Jenniefer Salak HoltExercises  Hooklying Hamstring Stretch with Strap - 2 x daily - 7 x weekly - 1 sets - 2-3 reps - 20 hold  Seated Table Piriformis Stretch - 2 x daily - 7 x weekly - 1 sets - 3 reps - 20 hold  Supine Bridge - 1 x daily - 7 x weekly - 10 reps - 5 hold  Standing Bilateral Low Shoulder Row with Anchored Resistance - 1 x daily - 7 x weekly - 1 sets - 10 reps  Wall Quarter Squat - 1 x daily - 7 x weekly - 1 sets - 10 reps - 5 hold  Seated Figure 4 Piriformis Stretch - 2 x daily - 7 x weekly - 1 sets - 10 reps - 15 hold  Standing Plank on Wall - 2 x daily - 7 x weekly - 1 sets - 3 reps - 30 sec hold  Shoulder External Rotation in 45 Degrees Abduction - 2 x daily - 7 x weekly - 1-2 sets - 10 reps - 3 sec hold  Seated Shoulder External Rotation with Dumbbells - 2 x daily - 7 x weekly - 1-2 sets - 10 reps - 3 sec hold
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
## 2019-10-12 ENCOUNTER — Encounter: Payer: BC Managed Care – PPO | Admitting: Rehabilitative and Restorative Service Providers"

## 2019-10-16 ENCOUNTER — Other Ambulatory Visit: Payer: Self-pay

## 2019-10-16 ENCOUNTER — Encounter: Payer: Self-pay | Admitting: Rehabilitative and Restorative Service Providers"

## 2019-10-16 ENCOUNTER — Ambulatory Visit (INDEPENDENT_AMBULATORY_CARE_PROVIDER_SITE_OTHER): Payer: BC Managed Care – PPO | Admitting: Rehabilitative and Restorative Service Providers"

## 2019-10-16 DIAGNOSIS — M545 Low back pain, unspecified: Secondary | ICD-10-CM

## 2019-10-16 DIAGNOSIS — M6281 Muscle weakness (generalized): Secondary | ICD-10-CM | POA: Diagnosis not present

## 2019-10-16 DIAGNOSIS — R293 Abnormal posture: Secondary | ICD-10-CM

## 2019-10-16 NOTE — Patient Instructions (Signed)
Access Code: [email or web address]: [email or web address]: 06/14/2021Prepared by: Benay Pomeroy HoltExercises  Hooklying Hamstring Stretch with Strap - 2 x daily - 7 x weekly - 1 sets - 2-3 reps - 20 hold  Seated Table Piriformis Stretch - 2 x daily - 7 x weekly - 1 sets - 3 reps - 20 hold  Supine Bridge - 1 x daily - 7 x weekly - 10 reps - 5 hold  Standing Bilateral Low Shoulder Row with Anchored Resistance - 1 x daily - 7 x weekly - 1 sets - 10 reps  Wall Quarter Squat - 1 x daily - 7 x weekly - 1 sets - 10 reps - 5 hold  Seated Figure 4 Piriformis Stretch - 2 x daily - 7 x weekly - 1 sets - 10 reps - 15 hold  Standing Plank on Wall - 2 x daily - 7 x weekly - 1 sets - 3 reps - 30 sec hold  Shoulder External Rotation in 45 Degrees Abduction - 2 x daily - 7 x weekly - 1-2 sets - 10 reps - 3 sec hold  Seated Shoulder External Rotation with Dumbbells - 2 x daily - 7 x weekly - 1-2 sets - 10 reps - 3 sec hold  Sidelying Hip Abduction - 1 x daily - 7 x weekly - 1 sets - 10 reps - 3 sec hold
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
## 2019-10-19 ENCOUNTER — Encounter: Payer: BC Managed Care – PPO | Admitting: Rehabilitative and Restorative Service Providers"

## 2019-10-23 ENCOUNTER — Ambulatory Visit (INDEPENDENT_AMBULATORY_CARE_PROVIDER_SITE_OTHER): Payer: BC Managed Care – PPO | Admitting: Rehabilitative and Restorative Service Providers"

## 2019-10-23 ENCOUNTER — Encounter: Payer: Self-pay | Admitting: Rehabilitative and Restorative Service Providers"

## 2019-10-23 ENCOUNTER — Other Ambulatory Visit: Payer: Self-pay

## 2019-10-23 DIAGNOSIS — R293 Abnormal posture: Secondary | ICD-10-CM

## 2019-10-23 DIAGNOSIS — M545 Low back pain, unspecified: Secondary | ICD-10-CM

## 2019-10-23 DIAGNOSIS — M6281 Muscle weakness (generalized): Secondary | ICD-10-CM
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
## 2019-10-23 NOTE — Patient Instructions (Addendum)
Pelvic Press    Place hands under belly between navel and pubic bone, palms up. Feel pressure on hands. Increase pressure on hands by pressing pelvis down. This is NOT a pelvic tilt. Hold _5-10__ seconds. Relax. Repeat __10_ times.    Leg Lift: One-Leg    Press pelvis down. Keep knee straight; lengthen and lift one leg (from waist). Do not twist body. Keep other leg down. Hold _5__ seconds. Relax. Repeat 10 time. Repeat with other leg.    Shoulder Blade Squeeze: Arms at Sides    Arms at sides, parallel, elbows straight, palms up. Press pelvis down. Squeeze backbone with shoulder blades, raising front of shoulders, chest, and arms. Keep head and neck neutral. Hold _5-10__ seconds. Relax. Repeat _10__ times.    Shoulder Blade Squeeze: Airplane    Arms out to sides at 90, elbows straight, palms down. Press pelvis down. Squeeze backbone with shoulder blades. Raise arms, front of shoulders, chest, and head. Keep neck neutral. Hold __5-10_ seconds. Relax. Repeat __10_ times.

## 2019-10-25 ENCOUNTER — Ambulatory Visit (INDEPENDENT_AMBULATORY_CARE_PROVIDER_SITE_OTHER): Payer: BC Managed Care – PPO | Admitting: Psychiatry

## 2019-10-25 ENCOUNTER — Encounter: Payer: Self-pay | Admitting: Psychiatry

## 2019-10-25 ENCOUNTER — Other Ambulatory Visit: Payer: Self-pay

## 2019-10-25 DIAGNOSIS — F411 Generalized anxiety disorder: Secondary | ICD-10-CM

## 2019-10-25 DIAGNOSIS — G4721 Circadian rhythm sleep disorder, delayed sleep phase type: Secondary | ICD-10-CM

## 2019-10-25 DIAGNOSIS — F331 Major depressive disorder, recurrent, moderate: Secondary | ICD-10-CM

## 2019-10-25 DIAGNOSIS — F5081 Binge eating disorder: Secondary | ICD-10-CM

## 2019-10-25 DIAGNOSIS — F900 Attention-deficit hyperactivity disorder, predominantly inattentive type: Secondary | ICD-10-CM | POA: Diagnosis not present

## 2019-10-25 MED ORDER — LISDEXAMFETAMINE DIMESYLATE 50 MG PO CAPS: 50.0000 mg | ORAL_CAPSULE | Freq: Every day | ORAL | 0 refills | Status: DC
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
## 2019-10-26 ENCOUNTER — Ambulatory Visit (INDEPENDENT_AMBULATORY_CARE_PROVIDER_SITE_OTHER): Payer: BLUE CROSS/BLUE SHIELD | Admitting: Rehabilitative and Restorative Service Providers"

## 2019-10-26 ENCOUNTER — Encounter: Payer: Self-pay | Admitting: Rehabilitative and Restorative Service Providers"

## 2019-10-26 DIAGNOSIS — R293 Abnormal posture: Secondary | ICD-10-CM | POA: Diagnosis not present

## 2019-10-26 DIAGNOSIS — M6281 Muscle weakness (generalized): Secondary | ICD-10-CM | POA: Diagnosis not present

## 2019-10-26 DIAGNOSIS — M545 Low back pain, unspecified: Secondary | ICD-10-CM
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
## 2019-10-26 NOTE — Patient Instructions (Signed)
Access Code: [email or web address]: [email or web address]: 06/24/2021Prepared by: Ekta Dancer HoltExercises  Hooklying Hamstring Stretch with Strap - 2 x daily - 7 x weekly - 1 sets - 2-3 reps - 20 hold  Seated Table Piriformis Stretch - 2 x daily - 7 x weekly - 1 sets - 3 reps - 20 hold  Supine Bridge - 1 x daily - 7 x weekly - 10 reps - 5 hold  Standing Bilateral Low Shoulder Row with Anchored Resistance - 1 x daily - 7 x weekly - 1 sets - 10 reps  Wall Quarter Squat - 1 x daily - 7 x weekly - 1 sets - 10 reps - 5 hold  Seated Figure 4 Piriformis Stretch - 2 x daily - 7 x weekly - 1 sets - 10 reps - 15 hold  Standing Plank on Wall - 2 x daily - 7 x weekly - 1 sets - 3 reps - 30 sec hold  Shoulder External Rotation in 45 Degrees Abduction - 2 x daily - 7 x weekly - 1-2 sets - 10 reps - 3 sec hold  Seated Shoulder External Rotation with Dumbbells - 2 x daily - 7 x weekly - 1-2 sets - 10 reps - 3 sec hold  Sidelying Hip Abduction - 1 x daily - 7 x weekly - 1 sets - 10 reps - 3 sec hold  Standing Lat Pull Down with Resistance - Elbows Bent - 2 x daily - 7 x weekly - 1 sets - 10 reps - 3 sec hold  Step Up - 2 x daily - 7 x weekly - 1 sets - 10 reps - 2 sec hold

## 2019-10-30 ENCOUNTER — Encounter: Payer: BC Managed Care – PPO | Admitting: Rehabilitative and Restorative Service Providers"

## 2019-11-02 ENCOUNTER — Encounter: Payer: BC Managed Care – PPO | Admitting: Rehabilitative and Restorative Service Providers"

## 2019-11-08 ENCOUNTER — Other Ambulatory Visit: Payer: Self-pay | Admitting: Psychiatry

## 2019-11-09 ENCOUNTER — Encounter: Payer: BLUE CROSS/BLUE SHIELD | Admitting: Rehabilitative and Restorative Service Providers"

## 2019-11-09 NOTE — Telephone Encounter (Signed)
Change her directions?

## 2019-12-04 ENCOUNTER — Other Ambulatory Visit: Payer: Self-pay | Admitting: Psychiatry

## 2019-12-06 ENCOUNTER — Telehealth: Payer: Self-pay | Admitting: Psychiatry

## 2019-12-06 ENCOUNTER — Other Ambulatory Visit: Payer: Self-pay

## 2019-12-06 DIAGNOSIS — F5081 Binge eating disorder: Secondary | ICD-10-CM

## 2019-12-06 NOTE — Telephone Encounter (Signed)
Last refill 10/25/2019 Pended for Dr. Clovis Pu to send

## 2019-12-06 NOTE — Telephone Encounter (Signed)
Pt would like a refill on Vyvanse 50mg . Please send to [facility] on [address]. Pt said that med is working well.

## 2019-12-07 MED ORDER — LISDEXAMFETAMINE DIMESYLATE 50 MG PO CAPS: 50.0000 mg | ORAL_CAPSULE | Freq: Every day | ORAL | 0 refills | Status: DC

## 2019-12-18 ENCOUNTER — Other Ambulatory Visit: Payer: Self-pay | Admitting: Family Medicine

## 2019-12-18 NOTE — Telephone Encounter (Signed)
RF request for methocarbamol. Last RX 06/01/19 # 180 x 1 rf.  Last OV 08/15/19 Next OV 01/05/20  Please advise.

## 2019-12-25 DIAGNOSIS — Z20822 Contact with and (suspected) exposure to covid-19: Secondary | ICD-10-CM | POA: Diagnosis not present

## 2019-12-25 DIAGNOSIS — Z03818 Encounter for observation for suspected exposure to other biological agents ruled out: Secondary | ICD-10-CM | POA: Diagnosis not present

## 2019-12-27 ENCOUNTER — Ambulatory Visit: Payer: BC Managed Care – PPO | Admitting: Psychiatry

## 2019-12-28 ENCOUNTER — Telehealth: Payer: Self-pay

## 2019-12-28 NOTE — Telephone Encounter (Signed)
Sorry I am unable to work her in
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
## 2019-12-28 NOTE — Telephone Encounter (Signed)
The only available slot for tomorrow would be 4:15 for virtual after the 4pm in office patient. Please advise if ok to schedule

## 2019-12-28 NOTE — Telephone Encounter (Signed)
LM for pt to returncall

## 2019-12-29 NOTE — Telephone Encounter (Signed)
Patient advised PCP would not be able to work in, appt set for next week changed to virtual.

## 2020-01-04 ENCOUNTER — Telehealth (INDEPENDENT_AMBULATORY_CARE_PROVIDER_SITE_OTHER): Payer: BLUE CROSS/BLUE SHIELD | Admitting: Family Medicine

## 2020-01-04 ENCOUNTER — Other Ambulatory Visit: Payer: Self-pay

## 2020-01-04 ENCOUNTER — Encounter: Payer: Self-pay | Admitting: Family Medicine

## 2020-01-04 DIAGNOSIS — J209 Acute bronchitis, unspecified: Secondary | ICD-10-CM | POA: Diagnosis not present

## 2020-01-04 DIAGNOSIS — J0191 Acute recurrent sinusitis, unspecified: Secondary | ICD-10-CM | POA: Diagnosis not present

## 2020-01-04 MED ORDER — ALBUTEROL SULFATE HFA 108 (90 BASE) MCG/ACT IN AERS
INHALATION_SPRAY | RESPIRATORY_TRACT | 0 refills | Status: DC
Start: 2020-01-04 — End: 2020-01-31

## 2020-01-04 MED ORDER — LEVOFLOXACIN 500 MG PO TABS
500.0000 mg | ORAL_TABLET | Freq: Every day | ORAL | 0 refills | Status: DC
Start: 2020-01-04 — End: 2020-08-07
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
## 2020-01-15 ENCOUNTER — Encounter: Payer: Self-pay | Admitting: Psychiatry

## 2020-01-15 ENCOUNTER — Telehealth (INDEPENDENT_AMBULATORY_CARE_PROVIDER_SITE_OTHER): Payer: BLUE CROSS/BLUE SHIELD | Admitting: Psychiatry

## 2020-01-15 DIAGNOSIS — F5081 Binge eating disorder: Secondary | ICD-10-CM

## 2020-01-15 DIAGNOSIS — F900 Attention-deficit hyperactivity disorder, predominantly inattentive type: Secondary | ICD-10-CM | POA: Diagnosis not present

## 2020-01-15 DIAGNOSIS — F331 Major depressive disorder, recurrent, moderate: Secondary | ICD-10-CM | POA: Diagnosis not present

## 2020-01-15 DIAGNOSIS — F411 Generalized anxiety disorder: Secondary | ICD-10-CM

## 2020-01-15 MED ORDER — LISDEXAMFETAMINE DIMESYLATE 50 MG PO CAPS: 50.0000 mg | ORAL_CAPSULE | Freq: Every day | ORAL | 0 refills | Status: DC

## 2020-01-15 MED ORDER — SERTRALINE HCL 100 MG PO TABS: 200.0000 mg | ORAL_TABLET | Freq: Every day | ORAL | 1 refills | Status: DC

## 2020-01-15 MED ORDER — CLONAZEPAM 1 MG PO TABS: 1.0000 mg | ORAL_TABLET | Freq: Three times a day (TID) | ORAL | 5 refills | Status: DC
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
## 2020-01-31 ENCOUNTER — Other Ambulatory Visit: Payer: Self-pay | Admitting: Family Medicine

## 2020-02-05 ENCOUNTER — Other Ambulatory Visit: Payer: Self-pay | Admitting: Family Medicine

## 2020-03-05 ENCOUNTER — Encounter: Payer: Self-pay | Admitting: Obstetrics & Gynecology

## 2020-03-05 ENCOUNTER — Ambulatory Visit: Payer: BLUE CROSS/BLUE SHIELD | Admitting: Obstetrics & Gynecology

## 2020-03-05 ENCOUNTER — Other Ambulatory Visit: Payer: Self-pay

## 2020-03-05 VITALS — BP 140/90 | Ht 62.0 in | Wt 219.6 lb

## 2020-03-05 DIAGNOSIS — Z780 Asymptomatic menopausal state: Secondary | ICD-10-CM

## 2020-03-05 DIAGNOSIS — Z01419 Encounter for gynecological examination (general) (routine) without abnormal findings: Secondary | ICD-10-CM

## 2020-03-05 DIAGNOSIS — Z6841 Body Mass Index (BMI) 40.0 and over, adult: Secondary | ICD-10-CM

## 2020-03-05 DIAGNOSIS — Z23 Encounter for immunization: Secondary | ICD-10-CM

## 2020-03-05 NOTE — Progress Notes (Signed)
Sabrina [name] 1960-08-15 [id number]   History:    59 y.o. G2P2L2 Divorced  RP:  New (>3 yrs) patient presenting for annual gyn exam   HPI: Postmenopause x 2017, well on no HRT.  No PMB.  Occasional Rt lower abdominal discomfort.  Abstinent. 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  Breasts normal.  BMI 40.17.  Recommend starting back with Weight Watcher.  [place] 2018.  BD normal 10/2016.  Health labs with Fam MD.  Past medical history,surgical history, family history and social history were all reviewed and documented in the EPIC chart.  Gynecologic History No LMP recorded (lmp unknown). Patient is postmenopausal.  Obstetric History OB History  Gravida Para Term Preterm AB Living  2 2       2   SAB TAB Ectopic Multiple Live Births               # Outcome Date GA Lbr Len/2nd Weight Sex Delivery Anes PTL Lv  2 Para           1 Para              ROS: A ROS was performed and pertinent positives and negatives are included in the history.  GENERAL: No fevers or chills. HEENT: No change in vision, no earache, sore throat or sinus congestion. NECK: No pain or stiffness. CARDIOVASCULAR: No chest pain or pressure. No palpitations. PULMONARY: No shortness of breath, cough or wheeze. GASTROINTESTINAL: No abdominal pain, nausea, vomiting or diarrhea, melena or bright red blood per rectum. GENITOURINARY: No urinary frequency, urgency, hesitancy or dysuria. MUSCULOSKELETAL: No joint or muscle pain, no back pain, no recent trauma. DERMATOLOGIC: No rash, no itching, no lesions. ENDOCRINE: No polyuria, polydipsia, no heat or cold intolerance. No recent change in weight. HEMATOLOGICAL: No anemia or easy bruising or bleeding. NEUROLOGIC: No headache, seizures, numbness, tingling or weakness. PSYCHIATRIC: No depression, no loss of interest in normal activity or change in sleep pattern.     Exam:   BP 140/90   Ht 5\' 2"  (1.575 m)   Wt 219 lb 9.6 oz (99.6 kg)   LMP  (LMP Unknown)   BMI 40.17 kg/m   Body mass  index is 40.17 kg/m.  General appearance : Well developed well nourished female. No acute distress HEENT: Eyes: no retinal hemorrhage or exudates,  Neck supple, trachea midline, no carotid bruits, no thyroidmegaly Lungs: Clear to auscultation, no rhonchi or wheezes, or rib retractions  Heart: Regular rate and rhythm, no murmurs or gallops Breast:Examined in sitting and supine position were symmetrical in appearance, no palpable masses or tenderness,  no skin retraction, no nipple inversion, no nipple discharge, no skin discoloration, no axillary or supraclavicular lymphadenopathy Abdomen: no palpable masses or tenderness, no rebound or guarding Extremities: no edema or skin discoloration or tenderness  Pelvic: Vulva: Normal             Vagina: No gross lesions or discharge  Cervix: No gross lesions or discharge.  Pap reflex done.  Uterus  AV, normal size, shape and consistency, non-tender and mobile  Adnexa  Without masses or tenderness  Anus: Normal   Assessment/Plan:  59 y.o. female for annual exam   1. Encounter for routine gynecological examination with Papanicolaou smear of cervix Normal gynecologic exam.  Pap reflex done.  Breast exam normal.  Overdue for screening mammogram, will schedule.  Colonoscopy 2018.  Health labs with family physician. - Pap IG w/ reflex to HPV when ASC-U  2. Postmenopause Well on  no hormone replacement therapy.  No postmenopausal bleeding.  Vitamin D supplements, calcium intake of 1500 mg daily and regular weightbearing physical activity is recommended.  3. Class 3 severe obesity due to excess calories with serious comorbidity and body mass index (BMI) of 40.0 to 44.9 in adult [facility]) Recommend a lower calorie/carb diet.  Aerobic activities 5 times a week and light weightlifting every 2 days.  4. Flu vaccine need Flu shot given.  Other orders - Flu Vaccine QUAD 36+ mos IM (Fluarix, Quad PF)   Princess Bruins MD, 12:32 PM 03/05/2020

## 2020-03-07 LAB — PAP IG W/ RFLX HPV ASCU

## 2020-03-08 ENCOUNTER — Encounter: Payer: Self-pay | Admitting: Obstetrics & Gynecology

## 2020-03-13 ENCOUNTER — Other Ambulatory Visit: Payer: Self-pay | Admitting: Family Medicine

## 2020-03-14 NOTE — Telephone Encounter (Signed)
RF request for Methocarbamol LOV:01/04/20 Next ov: advised to f/u 1 week Last written:02/06/20(60,0)  Medication pending. Please advise, thanks.

## 2020-03-14 NOTE — Telephone Encounter (Signed)
OK, methocarbamol eRx'd, #60 with 3 RFs

## 2020-04-10 ENCOUNTER — Other Ambulatory Visit: Payer: Self-pay | Admitting: Family Medicine

## 2020-04-19 ENCOUNTER — Other Ambulatory Visit: Payer: Self-pay | Admitting: Family Medicine

## 2020-06-03 ENCOUNTER — Telehealth: Payer: Self-pay

## 2020-06-03 ENCOUNTER — Other Ambulatory Visit: Payer: Self-pay

## 2020-06-03 MED ORDER — LOSARTAN POTASSIUM 100 MG PO TABS: 100.0000 mg | ORAL_TABLET | Freq: Every day | ORAL | 1 refills | Status: DC

## 2020-06-03 MED ORDER — HYDROCHLOROTHIAZIDE 25 MG PO TABS: 25.0000 mg | ORAL_TABLET | Freq: Every day | ORAL | 1 refills | Status: DC

## 2020-06-03 NOTE — Telephone Encounter (Signed)
Patient refill request. However, pharmacy is stating that this medication is on back order with no known estimate date when they can be restocked. Pharmacy told patient to contact provider to see if he would write separate prescriptions for each med OR change to something else. Patient will be out of meds tomorrow.  losartan-hydrochlorothiazide (HYZAAR) 100-25 MG tablet [[id number]]   If Dr. Anitra Lauth approves to 2 separate medications  Losartan Hydrochlorothiazide   [facility] [address] [facility] [place]

## 2020-06-03 NOTE — Telephone Encounter (Signed)
Patient returned call and advised medications sent as 2 separate pills instead due to backorder.

## 2020-06-03 NOTE — Telephone Encounter (Signed)
Ok to split into losartan 100mg , 1 tab qd, #90, RF x 1 and  HCTZ 25mg , 1 tab po qd, #90, RF x 1.-thx

## 2020-06-03 NOTE — Telephone Encounter (Signed)
Please advise if ok to split medication?

## 2020-06-03 NOTE — Telephone Encounter (Signed)
Separate Rx sent for losartan and HCTZ #90 with 1 refill to [facility]. LM for pt to return regarding med change

## 2020-06-10 ENCOUNTER — Other Ambulatory Visit: Payer: Self-pay | Admitting: Psychiatry

## 2020-06-10 ENCOUNTER — Other Ambulatory Visit: Payer: Self-pay | Admitting: Family Medicine

## 2020-06-10 ENCOUNTER — Telehealth: Payer: Self-pay | Admitting: Psychiatry

## 2020-06-10 DIAGNOSIS — F5081 Binge eating disorder: Secondary | ICD-10-CM

## 2020-06-10 DIAGNOSIS — F900 Attention-deficit hyperactivity disorder, predominantly inattentive type: Secondary | ICD-10-CM

## 2020-06-10 MED ORDER — LISDEXAMFETAMINE DIMESYLATE 50 MG PO CAPS: 50.0000 mg | ORAL_CAPSULE | Freq: Every day | ORAL | 0 refills | Status: DC

## 2020-06-10 NOTE — Telephone Encounter (Signed)
Next appt is 07/31/20. Requesting refill on Vyvanse [facility] mg [facility] [phone], phone # [phone].

## 2020-06-14 ENCOUNTER — Other Ambulatory Visit: Payer: Self-pay | Admitting: Family Medicine

## 2020-07-05 ENCOUNTER — Other Ambulatory Visit: Payer: Self-pay | Admitting: Family Medicine

## 2020-07-12 ENCOUNTER — Telehealth: Payer: Self-pay | Admitting: Family Medicine

## 2020-07-12 MED ORDER — CELECOXIB 200 MG PO CAPS: ORAL_CAPSULE | ORAL | 0 refills | Status: DC

## 2020-07-12 NOTE — Telephone Encounter (Signed)
Patient scheduled appt on 07/25/20 with Dr. Anitra Lauth. Patient requesting refill. She is out of meds. Can provider approved refill until her appt?  Can you please follow up with her before end of day today?  Thank you! [phone]  celecoxib (CELEBREX) 200 MG capsule [[id number]]    [id number] [place] [place] [facility] [address]

## 2020-07-12 NOTE — Telephone Encounter (Signed)
Rx sent to make appt

## 2020-07-12 NOTE — Addendum Note (Signed)
Addended by: Octaviano Glow on: 07/12/2020 02:58 PM   Modules accepted: Orders

## 2020-07-25 ENCOUNTER — Ambulatory Visit: Payer: BLUE CROSS/BLUE SHIELD | Admitting: Family Medicine

## 2020-07-29 ENCOUNTER — Encounter: Payer: Self-pay | Admitting: Family Medicine

## 2020-07-29 ENCOUNTER — Telehealth: Payer: Self-pay | Admitting: Family Medicine

## 2020-07-29 NOTE — Telephone Encounter (Signed)
Pt was lat seen 01/04/20. Pt is following up for refill of celebrex. [place] for virtual?

## 2020-07-29 NOTE — Telephone Encounter (Signed)
Yes, virtual okay

## 2020-07-29 NOTE — Telephone Encounter (Signed)
Spoke with patient to inform as long as she remains in the state of [place], the appointment can proceed as virtual. Appt changed

## 2020-07-29 NOTE — Telephone Encounter (Signed)
Patient is out of town (still in [place]) and wants to know if her appt 07/31/20 can be virtual. She states she has a [facility] nearby if Dr. Anitra Lauth wants blood work at that time. Please call patient to advise.

## 2020-07-31 ENCOUNTER — Telehealth: Payer: Self-pay | Admitting: Family Medicine

## 2020-07-31 ENCOUNTER — Telehealth (INDEPENDENT_AMBULATORY_CARE_PROVIDER_SITE_OTHER): Payer: 59 | Admitting: Psychiatry

## 2020-07-31 ENCOUNTER — Encounter: Payer: Self-pay | Admitting: Psychiatry

## 2020-07-31 ENCOUNTER — Telehealth: Payer: 59 | Admitting: Family Medicine

## 2020-07-31 DIAGNOSIS — F411 Generalized anxiety disorder: Secondary | ICD-10-CM

## 2020-07-31 DIAGNOSIS — F5081 Binge eating disorder: Secondary | ICD-10-CM

## 2020-07-31 DIAGNOSIS — F4310 Post-traumatic stress disorder, unspecified: Secondary | ICD-10-CM | POA: Diagnosis not present

## 2020-07-31 DIAGNOSIS — F900 Attention-deficit hyperactivity disorder, predominantly inattentive type: Secondary | ICD-10-CM

## 2020-07-31 DIAGNOSIS — F331 Major depressive disorder, recurrent, moderate: Secondary | ICD-10-CM

## 2020-07-31 MED ORDER — LISDEXAMFETAMINE DIMESYLATE 50 MG PO CAPS: 50.0000 mg | ORAL_CAPSULE | Freq: Every day | ORAL | 0 refills | Status: DC

## 2020-07-31 MED ORDER — SERTRALINE HCL 100 MG PO TABS: 200.0000 mg | ORAL_TABLET | Freq: Every day | ORAL | 1 refills | Status: DC

## 2020-07-31 MED ORDER — CELECOXIB 200 MG PO CAPS: ORAL_CAPSULE | ORAL | 0 refills | Status: DC

## 2020-07-31 NOTE — Telephone Encounter (Signed)
Temporary supply given until 4/6, pt advised.

## 2020-07-31 NOTE — Telephone Encounter (Signed)
Patient requesting refill of Celebrex. Today's appt rescheduled due to provider out of office. [facility] to [facility] [place], [place], [place].

## 2020-07-31 NOTE — Patient Instructions (Signed)
EMDR therapists: Lina Sayre at [facility] at [facility]
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
## 2020-08-07 ENCOUNTER — Ambulatory Visit (INDEPENDENT_AMBULATORY_CARE_PROVIDER_SITE_OTHER): Payer: 59 | Admitting: Family Medicine

## 2020-08-07 ENCOUNTER — Other Ambulatory Visit: Payer: Self-pay

## 2020-08-07 ENCOUNTER — Encounter: Payer: Self-pay | Admitting: Family Medicine

## 2020-08-07 VITALS — BP 119/78 | HR 85 | Temp 97.8°F | Resp 16 | Ht 62.0 in | Wt 208.4 lb

## 2020-08-07 DIAGNOSIS — Z1231 Encounter for screening mammogram for malignant neoplasm of breast: Secondary | ICD-10-CM | POA: Diagnosis not present

## 2020-08-07 DIAGNOSIS — E7800 Pure hypercholesterolemia, unspecified: Secondary | ICD-10-CM

## 2020-08-07 DIAGNOSIS — Z23 Encounter for immunization: Secondary | ICD-10-CM | POA: Diagnosis not present

## 2020-08-07 DIAGNOSIS — Z791 Long term (current) use of non-steroidal anti-inflammatories (NSAID): Secondary | ICD-10-CM | POA: Diagnosis not present

## 2020-08-07 DIAGNOSIS — I10 Essential (primary) hypertension: Secondary | ICD-10-CM

## 2020-08-07 DIAGNOSIS — Z0000 Encounter for general adult medical examination without abnormal findings: Secondary | ICD-10-CM | POA: Diagnosis not present

## 2020-08-07 DIAGNOSIS — R7301 Impaired fasting glucose: Secondary | ICD-10-CM

## 2020-08-07 MED ORDER — CELECOXIB 200 MG PO CAPS: ORAL_CAPSULE | ORAL | 1 refills | Status: DC

## 2020-08-07 NOTE — Patient Instructions (Signed)
Health Maintenance, Female Adopting a healthy lifestyle and getting preventive care are important in promoting health and wellness. Ask your health care provider about:  The right schedule for you to have regular tests and exams.  Things you can do on your own to prevent diseases and keep yourself healthy. What should I know about diet, weight, and exercise? Eat a healthy diet  Eat a diet that includes plenty of vegetables, fruits, low-fat dairy products, and lean protein.  Do not eat a lot of foods that are high in solid fats, added sugars, or sodium.   Maintain a healthy weight Body mass index (BMI) is used to identify weight problems. It estimates body fat based on height and weight. Your health care provider can help determine your BMI and help you achieve or maintain a healthy weight. Get regular exercise Get regular exercise. This is one of the most important things you can do for your health. Most adults should:  Exercise for at least 150 minutes each week. The exercise should increase your heart rate and make you sweat (moderate-intensity exercise).  Do strengthening exercises at least twice a week. This is in addition to the moderate-intensity exercise.  Spend less time sitting. Even light physical activity can be beneficial. Watch cholesterol and blood lipids Have your blood tested for lipids and cholesterol at 58 years of age, then have this test every 5 years. Have your cholesterol levels checked more often if:  Your lipid or cholesterol levels are high.  You are older than 58 years of age.  You are at high risk for heart disease. What should I know about cancer screening? Depending on your health history and family history, you may need to have cancer screening at various ages. This may include screening for:  Breast cancer.  Cervical cancer.  Colorectal cancer.  Skin cancer.  Lung cancer. What should I know about heart disease, diabetes, and high blood  pressure? Blood pressure and heart disease  High blood pressure causes heart disease and increases the risk of stroke. This is more likely to develop in people who have high blood pressure readings, are of African descent, or are overweight.  Have your blood pressure checked: ? Every 3-5 years if you are 18-39 years of age. ? Every year if you are 40 years old or older. Diabetes Have regular diabetes screenings. This checks your fasting blood sugar level. Have the screening done:  Once every three years after age 40 if you are at a normal weight and have a low risk for diabetes.  More often and at a younger age if you are overweight or have a high risk for diabetes. What should I know about preventing infection? Hepatitis B If you have a higher risk for hepatitis B, you should be screened for this virus. Talk with your health care provider to find out if you are at risk for hepatitis B infection. Hepatitis C Testing is recommended for:  Everyone born from 1945 through 1965.  Anyone with known risk factors for hepatitis C. Sexually transmitted infections (STIs)  Get screened for STIs, including gonorrhea and chlamydia, if: ? You are sexually active and are younger than 58 years of age. ? You are older than 58 years of age and your health care provider tells you that you are at risk for this type of infection. ? Your sexual activity has changed since you were last screened, and you are at increased risk for chlamydia or gonorrhea. Ask your health care provider   if you are at risk.  Ask your health care provider about whether you are at high risk for HIV. Your health care provider may recommend a prescription medicine to help prevent HIV infection. If you choose to take medicine to prevent HIV, you should first get tested for HIV. You should then be tested every 3 months for as long as you are taking the medicine. Pregnancy  If you are about to stop having your period (premenopausal) and  you may become pregnant, seek counseling before you get pregnant.  Take 400 to 800 micrograms (mcg) of folic acid every day if you become pregnant.  Ask for birth control (contraception) if you want to prevent pregnancy. Osteoporosis and menopause Osteoporosis is a disease in which the bones lose minerals and strength with aging. This can result in bone fractures. If you are 65 years old or older, or if you are at risk for osteoporosis and fractures, ask your health care provider if you should:  Be screened for bone loss.  Take a calcium or vitamin D supplement to lower your risk of fractures.  Be given hormone replacement therapy (HRT) to treat symptoms of menopause. Follow these instructions at home: Lifestyle  Do not use any products that contain nicotine or tobacco, such as cigarettes, e-cigarettes, and chewing tobacco. If you need help quitting, ask your health care provider.  Do not use street drugs.  Do not share needles.  Ask your health care provider for help if you need support or information about quitting drugs. Alcohol use  Do not drink alcohol if: ? Your health care provider tells you not to drink. ? You are pregnant, may be pregnant, or are planning to become pregnant.  If you drink alcohol: ? Limit how much you use to 0-1 drink a day. ? Limit intake if you are breastfeeding.  Be aware of how much alcohol is in your drink. In the [place] one drink equals one 12 oz bottle of beer (355 mL), one 5 oz glass of wine (148 mL), or one 1 oz glass of hard liquor (44 mL). General instructions  Schedule regular health, dental, and eye exams.  Stay current with your vaccines.  Tell your health care provider if: ? You often feel depressed. ? You have ever been abused or do not feel safe at home. Summary  Adopting a healthy lifestyle and getting preventive care are important in promoting health and wellness.  Follow your health care provider's instructions about healthy  diet, exercising, and getting tested or screened for diseases.  Follow your health care provider's instructions on monitoring your cholesterol and blood pressure. This information is not intended to replace advice given to you by your health care provider. Make sure you discuss any questions you have with your health care provider. Document Revised: 04/13/2018 Document Reviewed: 04/13/2018 Elsevier Patient Education  2021 [employer]  
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
## 2020-08-07 NOTE — Addendum Note (Signed)
Addended by: Deveron Furlong D on: 08/07/2020 02:38 PM   Modules accepted: Orders

## 2020-08-08 ENCOUNTER — Ambulatory Visit (INDEPENDENT_AMBULATORY_CARE_PROVIDER_SITE_OTHER): Payer: 59

## 2020-08-08 DIAGNOSIS — E7800 Pure hypercholesterolemia, unspecified: Secondary | ICD-10-CM

## 2020-08-08 DIAGNOSIS — I10 Essential (primary) hypertension: Secondary | ICD-10-CM | POA: Diagnosis not present

## 2020-08-08 DIAGNOSIS — R7301 Impaired fasting glucose: Secondary | ICD-10-CM

## 2020-08-08 LAB — COMPREHENSIVE METABOLIC PANEL
ALT: 14 U/L (ref 0–35)
AST: 18 U/L (ref 0–37)
Albumin: 4.7 g/dL (ref 3.5–5.2)
Alkaline Phosphatase: 89 U/L (ref 39–117)
BUN: 24 mg/dL — ABNORMAL HIGH (ref 6–23)
CO2: 28 mEq/L (ref 19–32)
Calcium: 9.5 mg/dL (ref 8.4–10.5)
Chloride: 104 mEq/L (ref 96–112)
Creatinine, Ser: 0.8 mg/dL (ref 0.40–1.20)
GFR: 81.87 mL/min (ref >60.00)
Glucose, Bld: 89 mg/dL (ref 70–99)
Potassium: 3.6 mEq/L (ref 3.5–5.1)
Sodium: 140 mEq/L (ref 135–145)
Total Bilirubin: 0.6 mg/dL (ref 0.2–1.2)
Total Protein: 6.8 g/dL (ref 6.0–8.3)

## 2020-08-08 LAB — CBC WITH DIFFERENTIAL/PLATELET
Basophils Absolute: 0.1 10*3/uL (ref 0.0–0.1)
Basophils Relative: 1 % (ref 0.0–3.0)
Eosinophils Absolute: 0.1 10*3/uL (ref 0.0–0.7)
Eosinophils Relative: 1.5 % (ref 0.0–5.0)
HCT: 42.6 % (ref 36.0–46.0)
Hemoglobin: 14.3 g/dL (ref 12.0–15.0)
Lymphocytes Relative: 28.8 % (ref 12.0–46.0)
Lymphs Abs: 2 10*3/uL (ref 0.7–4.0)
MCHC: 33.5 g/dL (ref 30.0–36.0)
MCV: 83.3 fl (ref 78.0–100.0)
Monocytes Absolute: 0.6 10*3/uL (ref 0.1–1.0)
Monocytes Relative: 8.5 % (ref 3.0–12.0)
Neutro Abs: 4.1 10*3/uL (ref 1.4–7.7)
Neutrophils Relative %: 60.2 % (ref 43.0–77.0)
Platelets: 298 10*3/uL (ref 150.0–400.0)
RBC: 5.11 Mil/uL (ref 3.87–5.11)
RDW: 14.6 % (ref 11.5–15.5)
WBC: 6.8 10*3/uL (ref 4.0–10.5)

## 2020-08-08 LAB — LIPID PANEL
Cholesterol: 193 mg/dL (ref 0–200)
HDL: 57.7 mg/dL (ref >39.00)
LDL Cholesterol: 108 mg/dL — ABNORMAL HIGH (ref 0–99)
NonHDL: 135.21
Total CHOL/HDL Ratio: 3
Triglycerides: 135 mg/dL (ref 0.0–149.0)
VLDL: 27 mg/dL (ref 0.0–40.0)

## 2020-08-08 LAB — TSH: TSH: 0.6 u[IU]/mL (ref 0.35–4.50)

## 2020-08-08 LAB — HEMOGLOBIN A1C: Hgb A1c MFr Bld: 5.3 % (ref 4.6–6.5)

## 2020-08-14 ENCOUNTER — Other Ambulatory Visit: Payer: Self-pay

## 2020-08-14 ENCOUNTER — Other Ambulatory Visit: Payer: Self-pay | Admitting: Family Medicine

## 2020-08-14 ENCOUNTER — Ambulatory Visit (INDEPENDENT_AMBULATORY_CARE_PROVIDER_SITE_OTHER): Payer: 59

## 2020-08-14 DIAGNOSIS — Z1231 Encounter for screening mammogram for malignant neoplasm of breast: Secondary | ICD-10-CM

## 2020-08-20 ENCOUNTER — Other Ambulatory Visit: Payer: Self-pay | Admitting: Psychiatry

## 2020-08-20 ENCOUNTER — Other Ambulatory Visit: Payer: Self-pay | Admitting: Family Medicine

## 2020-08-20 DIAGNOSIS — F411 Generalized anxiety disorder: Secondary | ICD-10-CM

## 2020-09-05 ENCOUNTER — Other Ambulatory Visit: Payer: Self-pay | Admitting: Family Medicine

## 2020-10-01 ENCOUNTER — Telehealth: Payer: Self-pay | Admitting: Psychiatry

## 2020-10-01 ENCOUNTER — Other Ambulatory Visit: Payer: Self-pay

## 2020-10-01 DIAGNOSIS — F411 Generalized anxiety disorder: Secondary | ICD-10-CM

## 2020-10-01 DIAGNOSIS — F331 Major depressive disorder, recurrent, moderate: Secondary | ICD-10-CM

## 2020-10-01 MED ORDER — SERTRALINE HCL 100 MG PO TABS
200.0000 mg | ORAL_TABLET | Freq: Every day | ORAL | 0 refills | Status: DC
Start: 2020-10-01 — End: 2021-02-25

## 2020-10-01 MED ORDER — SERTRALINE HCL 100 MG PO TABS: 200.0000 mg | ORAL_TABLET | Freq: Every day | ORAL | 0 refills | Status: DC

## 2020-10-01 NOTE — Telephone Encounter (Signed)
Rx sent 

## 2020-10-01 NOTE — Telephone Encounter (Signed)
Call to find out which medication is she trying to fill?  Is it too soon and why?

## 2020-10-01 NOTE — Telephone Encounter (Signed)
Patient called back stating that pharmacy wouldn't fill prescription because it was to soon. She would like to know is it possible to cancel it from [facility]. She would like it sent instead to [facility].  Pharmacy [facility] [place] Ph [phone]

## 2020-10-01 NOTE — Telephone Encounter (Signed)
OK to send.

## 2020-10-01 NOTE — Telephone Encounter (Signed)
Pt called in today regarding refill for Setraline 100mg . States she is visiting [place] and somewhere along the way she misplaced her medication. She will be there for about a month and will need prescription filled there. Ph: [place], [place]

## 2020-10-01 NOTE — Telephone Encounter (Signed)
Sorry for clarification she called earlier for a rx for sertraline because she misplaced it and she is out of town.I called and cancelled rx and [facility] to [facility].She will use good rx to fill

## 2020-10-01 NOTE — Telephone Encounter (Signed)
OK so this is resolved?  No action needed by me?

## 2020-10-01 NOTE — Telephone Encounter (Signed)
Ok to send?She lost her medication

## 2020-10-01 NOTE — Telephone Encounter (Signed)
Please review

## 2020-10-02 NOTE — Telephone Encounter (Signed)
Yes its resolved

## 2020-10-25 ENCOUNTER — Telehealth: Payer: Self-pay | Admitting: Psychiatry

## 2020-10-25 ENCOUNTER — Other Ambulatory Visit: Payer: Self-pay

## 2020-10-25 DIAGNOSIS — F5081 Binge eating disorder: Secondary | ICD-10-CM

## 2020-10-25 DIAGNOSIS — F900 Attention-deficit hyperactivity disorder, predominantly inattentive type: Secondary | ICD-10-CM

## 2020-10-25 MED ORDER — LISDEXAMFETAMINE DIMESYLATE 50 MG PO CAPS: 50.0000 mg | ORAL_CAPSULE | Freq: Every day | ORAL | 0 refills | Status: DC

## 2020-10-25 NOTE — Telephone Encounter (Signed)
review 

## 2020-10-25 NOTE — Telephone Encounter (Signed)
Pt lvm stating she needed a refill for Vyvanse. 50mg . States that she has been out of town and didn't return until last night and realized she was out. PH: [phone]. [facility]acy [facility] [id number] [place], [place].

## 2020-10-25 NOTE — Telephone Encounter (Signed)
Last refill 5/22 Pended for [facility] to send for Dr. Clovis Pu while he's out of office.

## 2020-10-30 ENCOUNTER — Other Ambulatory Visit: Payer: Self-pay | Admitting: Family Medicine

## 2020-11-18 ENCOUNTER — Other Ambulatory Visit: Payer: Self-pay | Admitting: Family Medicine

## 2020-11-18 ENCOUNTER — Telehealth: Payer: Self-pay | Admitting: Family Medicine

## 2020-11-18 MED ORDER — CELECOXIB 200 MG PO CAPS: ORAL_CAPSULE | ORAL | 1 refills | Status: DC

## 2020-11-18 NOTE — Telephone Encounter (Signed)
Spoke with pt, she tried to pick up rx on Friday or Saturday but got refunded due to cost of med. She was informed by pharmacist at [facility], they have been having issues with [facility]s transferring [facility]. It is taking about 3 days to complete. She is unable to sleep and in pain.   Med pending for new rx to be sent to [facility]. Please advise

## 2020-11-18 NOTE — Telephone Encounter (Signed)
Pt notified rx sent.

## 2020-11-18 NOTE — Telephone Encounter (Signed)
Celebrex eRx'd

## 2020-11-18 NOTE — Telephone Encounter (Signed)
Patient requesting Celebrex RX be sent to to [facility] on [address] in [place]. She stated she did NOT pick [facility] at [facility] due to high price. [facility] price is much lower.

## 2020-11-19 ENCOUNTER — Encounter: Payer: Self-pay | Admitting: Family Medicine

## 2020-12-23 ENCOUNTER — Ambulatory Visit (HOSPITAL_BASED_OUTPATIENT_CLINIC_OR_DEPARTMENT_OTHER): Payer: 59 | Admitting: Physical Therapy

## 2020-12-26 ENCOUNTER — Encounter (HOSPITAL_BASED_OUTPATIENT_CLINIC_OR_DEPARTMENT_OTHER): Payer: Self-pay

## 2020-12-26 ENCOUNTER — Ambulatory Visit (HOSPITAL_BASED_OUTPATIENT_CLINIC_OR_DEPARTMENT_OTHER): Payer: 59 | Admitting: Physical Therapy

## 2021-01-02 ENCOUNTER — Other Ambulatory Visit: Payer: Self-pay

## 2021-01-03 ENCOUNTER — Ambulatory Visit (INDEPENDENT_AMBULATORY_CARE_PROVIDER_SITE_OTHER): Payer: 59

## 2021-01-03 ENCOUNTER — Encounter: Payer: Self-pay | Admitting: Family Medicine

## 2021-01-03 ENCOUNTER — Ambulatory Visit (INDEPENDENT_AMBULATORY_CARE_PROVIDER_SITE_OTHER): Payer: 59 | Admitting: Family Medicine

## 2021-01-03 VITALS — BP 111/67 | HR 80 | Temp 98.0°F | Resp 16 | Ht 62.0 in | Wt 196.4 lb

## 2021-01-03 DIAGNOSIS — M7989 Other specified soft tissue disorders: Secondary | ICD-10-CM

## 2021-01-03 DIAGNOSIS — M79675 Pain in left toe(s): Secondary | ICD-10-CM

## 2021-01-03 DIAGNOSIS — G8929 Other chronic pain: Secondary | ICD-10-CM

## 2021-01-03 DIAGNOSIS — R21 Rash and other nonspecific skin eruption: Secondary | ICD-10-CM | POA: Diagnosis not present

## 2021-01-03 LAB — CBC WITH DIFFERENTIAL/PLATELET
Basophils Absolute: 0.1 10*3/uL (ref 0.0–0.1)
Basophils Relative: 0.6 % (ref 0.0–3.0)
Eosinophils Absolute: 0.1 10*3/uL (ref 0.0–0.7)
Eosinophils Relative: 1.3 % (ref 0.0–5.0)
HCT: 40.4 % (ref 36.0–46.0)
Hemoglobin: 13.5 g/dL (ref 12.0–15.0)
Lymphocytes Relative: 22.2 % (ref 12.0–46.0)
Lymphs Abs: 1.9 10*3/uL (ref 0.7–4.0)
MCHC: 33.5 g/dL (ref 30.0–36.0)
MCV: 84.2 fl (ref 78.0–100.0)
Monocytes Absolute: 0.7 10*3/uL (ref 0.1–1.0)
Monocytes Relative: 8.5 % (ref 3.0–12.0)
Neutro Abs: 5.7 10*3/uL (ref 1.4–7.7)
Neutrophils Relative %: 67.4 % (ref 43.0–77.0)
Platelets: 260 10*3/uL (ref 150.0–400.0)
RBC: 4.8 Mil/uL (ref 3.87–5.11)
RDW: 13.7 % (ref 11.5–15.5)
WBC: 8.5 10*3/uL (ref 4.0–10.5)

## 2021-01-03 LAB — SEDIMENTATION RATE: Sed Rate: 18 mm/hr (ref 0–30)

## 2021-01-03 MED ORDER — CEPHALEXIN 500 MG PO CAPS: 500.0000 mg | ORAL_CAPSULE | Freq: Three times a day (TID) | ORAL | 0 refills | Status: DC

## 2021-01-03 MED ORDER — VALACYCLOVIR HCL 1 G PO TABS
1000.0000 mg | ORAL_TABLET | Freq: Three times a day (TID) | ORAL | 0 refills | Status: DC
Start: 2021-01-03 — End: 2021-01-10
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
## 2021-01-07 ENCOUNTER — Other Ambulatory Visit: Payer: Self-pay

## 2021-01-07 ENCOUNTER — Telehealth: Payer: Self-pay | Admitting: Psychiatry

## 2021-01-07 DIAGNOSIS — F5081 Binge eating disorder: Secondary | ICD-10-CM

## 2021-01-07 DIAGNOSIS — F900 Attention-deficit hyperactivity disorder, predominantly inattentive type: Secondary | ICD-10-CM

## 2021-01-07 MED ORDER — LISDEXAMFETAMINE DIMESYLATE 50 MG PO CAPS: 50.0000 mg | ORAL_CAPSULE | Freq: Every day | ORAL | 0 refills | Status: DC

## 2021-01-07 NOTE — Telephone Encounter (Signed)
Pt called and would like refill on Vyvanse.

## 2021-01-07 NOTE — Telephone Encounter (Signed)
Pended.

## 2021-01-07 NOTE — Telephone Encounter (Signed)
Refill of Vyvanse to  [facility]arm[facility]  PH: [place], [place].

## 2021-01-10 ENCOUNTER — Encounter: Payer: Self-pay | Admitting: Family Medicine

## 2021-01-10 ENCOUNTER — Ambulatory Visit (INDEPENDENT_AMBULATORY_CARE_PROVIDER_SITE_OTHER): Payer: 59 | Admitting: Family Medicine

## 2021-01-10 ENCOUNTER — Other Ambulatory Visit: Payer: Self-pay

## 2021-01-10 VITALS — BP 110/74 | HR 80 | Temp 98.4°F | Ht 62.0 in | Wt 197.4 lb

## 2021-01-10 DIAGNOSIS — L089 Local infection of the skin and subcutaneous tissue, unspecified: Secondary | ICD-10-CM | POA: Diagnosis not present

## 2021-01-10 DIAGNOSIS — F4310 Post-traumatic stress disorder, unspecified: Secondary | ICD-10-CM | POA: Insufficient documentation

## 2021-01-10 DIAGNOSIS — M79675 Pain in left toe(s): Secondary | ICD-10-CM | POA: Diagnosis not present

## 2021-01-10 MED ORDER — CEPHALEXIN 500 MG PO CAPS: 500.0000 mg | ORAL_CAPSULE | Freq: Three times a day (TID) | ORAL | 0 refills | Status: DC

## 2021-01-10 NOTE — Patient Instructions (Signed)
Call if your toe is still bothering you significantly in 10-12 days.
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
## 2021-01-27 ENCOUNTER — Telehealth: Payer: Self-pay | Admitting: Psychiatry

## 2021-01-27 NOTE — Telephone Encounter (Signed)
Pt has been seen by Dr. Clovis Pu.  She called asking for help to recommend someone who can help with EMDR therapy.  She saw someone 3 times in [place] and then had to stop because she had to pay $130 each time out of pocket.  She has PTSD and would really like to continue the EMDR therapy but doesn't know where to get it.

## 2021-01-27 NOTE — Telephone Encounter (Signed)
Any recommendations or suggestions?

## 2021-01-28 NOTE — Telephone Encounter (Signed)
I have a list Fred May gave me.  Please get it to her.  I put a copy in office box.  Beth can you scan it and get me an electronic copy so I don't lose it?

## 2021-01-31 ENCOUNTER — Other Ambulatory Visit: Payer: Self-pay | Admitting: Family Medicine

## 2021-02-06 ENCOUNTER — Other Ambulatory Visit: Payer: Self-pay

## 2021-02-06 ENCOUNTER — Encounter: Payer: Self-pay | Admitting: Family Medicine

## 2021-02-06 ENCOUNTER — Ambulatory Visit (INDEPENDENT_AMBULATORY_CARE_PROVIDER_SITE_OTHER): Payer: 59 | Admitting: Family Medicine

## 2021-02-06 VITALS — BP 112/69 | HR 87 | Temp 98.4°F | Ht 62.0 in | Wt 197.4 lb

## 2021-02-06 DIAGNOSIS — Z791 Long term (current) use of non-steroidal anti-inflammatories (NSAID): Secondary | ICD-10-CM

## 2021-02-06 DIAGNOSIS — G894 Chronic pain syndrome: Secondary | ICD-10-CM

## 2021-02-06 DIAGNOSIS — M150 Primary generalized (osteo)arthritis: Secondary | ICD-10-CM

## 2021-02-06 DIAGNOSIS — E7800 Pure hypercholesterolemia, unspecified: Secondary | ICD-10-CM | POA: Diagnosis not present

## 2021-02-06 DIAGNOSIS — Z23 Encounter for immunization: Secondary | ICD-10-CM | POA: Diagnosis not present

## 2021-02-06 DIAGNOSIS — Z5181 Encounter for therapeutic drug level monitoring: Secondary | ICD-10-CM | POA: Diagnosis not present

## 2021-02-06 DIAGNOSIS — I10 Essential (primary) hypertension: Secondary | ICD-10-CM | POA: Diagnosis not present

## 2021-02-06 DIAGNOSIS — M159 Polyosteoarthritis, unspecified: Secondary | ICD-10-CM

## 2021-02-06 DIAGNOSIS — M797 Fibromyalgia: Secondary | ICD-10-CM

## 2021-02-06 MED ORDER — LOSARTAN POTASSIUM 100 MG PO TABS: 100.0000 mg | ORAL_TABLET | Freq: Every day | ORAL | 3 refills | Status: DC

## 2021-02-06 MED ORDER — OMEPRAZOLE 40 MG PO CPDR: 40.0000 mg | DELAYED_RELEASE_CAPSULE | Freq: Two times a day (BID) | ORAL | 3 refills | Status: DC

## 2021-02-06 MED ORDER — HYDROCHLOROTHIAZIDE 25 MG PO TABS: 25.0000 mg | ORAL_TABLET | Freq: Every day | ORAL | 3 refills | Status: AC
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
## 2021-02-07 LAB — COMPREHENSIVE METABOLIC PANEL
ALT: 17 U/L (ref 0–35)
AST: 22 U/L (ref 0–37)
Albumin: 4.8 g/dL (ref 3.5–5.2)
Alkaline Phosphatase: 95 U/L (ref 39–117)
BUN: 20 mg/dL (ref 6–23)
CO2: 28 mEq/L (ref 19–32)
Calcium: 10 mg/dL (ref 8.4–10.5)
Chloride: 100 mEq/L (ref 96–112)
Creatinine, Ser: 0.85 mg/dL (ref 0.40–1.20)
GFR: 75.85 mL/min (ref >60.00)
Glucose, Bld: 91 mg/dL (ref 70–99)
Potassium: 4.1 mEq/L (ref 3.5–5.1)
Sodium: 137 mEq/L (ref 135–145)
Total Bilirubin: 0.6 mg/dL (ref 0.2–1.2)
Total Protein: 7.1 g/dL (ref 6.0–8.3)

## 2021-02-10 ENCOUNTER — Telehealth: Payer: Self-pay | Admitting: Psychiatry

## 2021-02-10 NOTE — Telephone Encounter (Signed)
Patient called requesting refills on the Vyvanse. She is also inquiring is increasing the dosage an option. Last office visit was 3/30, with no follow up appointment scheduled.

## 2021-02-11 NOTE — Telephone Encounter (Signed)
No work in time.  She's on max sertraline 200.  I could RX busiprione 15 mg BID for anxiety.  I can't do anything else until I see her. Put her on cancellation list.

## 2021-02-11 NOTE — Telephone Encounter (Signed)
Please schedule appt. Pt stated the vyvanse does not seem to be working as well as it use to.She has some increased stress and is more anxious.She is also binge eating.I informed her you may want to see her before increasing the dose.

## 2021-02-11 NOTE — Telephone Encounter (Signed)
Pt is requesting appt with Cottle ASAP. We don't have anything. Work in time?

## 2021-02-12 ENCOUNTER — Other Ambulatory Visit: Payer: Self-pay

## 2021-02-12 ENCOUNTER — Other Ambulatory Visit: Payer: Self-pay | Admitting: Psychiatry

## 2021-02-12 DIAGNOSIS — F900 Attention-deficit hyperactivity disorder, predominantly inattentive type: Secondary | ICD-10-CM

## 2021-02-12 DIAGNOSIS — F5081 Binge eating disorder: Secondary | ICD-10-CM

## 2021-02-12 MED ORDER — LISDEXAMFETAMINE DIMESYLATE 50 MG PO CAPS: 50.0000 mg | ORAL_CAPSULE | Freq: Every day | ORAL | 0 refills | Status: DC

## 2021-02-12 MED ORDER — BUSPIRONE HCL 15 MG PO TABS: ORAL_TABLET | ORAL | 1 refills | Status: DC

## 2021-02-12 NOTE — Progress Notes (Signed)
Start BuSpar 15 mg 1/3 tablet twice daily for 1 week, then increase to 2/3 tablet twice daily for 1 week, then increase to 1 tablet twice daily for anxiety.

## 2021-02-12 NOTE — Telephone Encounter (Signed)
Pt informed

## 2021-02-12 NOTE — Telephone Encounter (Signed)
Pt wants to know if she should take the vyvanse with buspar

## 2021-02-12 NOTE — Telephone Encounter (Signed)
Yes and I sent RX with following instructions on the bottle: Start BuSpar 15 mg 1/3 tablet twice daily for 1 week, then increase to 2/3 tablet twice daily for 1 week, then increase to 1 tablet twice daily for anxiety.

## 2021-02-18 ENCOUNTER — Other Ambulatory Visit: Payer: Self-pay

## 2021-02-18 ENCOUNTER — Encounter: Payer: Self-pay | Admitting: Psychiatry

## 2021-02-18 ENCOUNTER — Ambulatory Visit (INDEPENDENT_AMBULATORY_CARE_PROVIDER_SITE_OTHER): Payer: 59 | Admitting: Psychiatry

## 2021-02-18 DIAGNOSIS — F4310 Post-traumatic stress disorder, unspecified: Secondary | ICD-10-CM | POA: Diagnosis not present

## 2021-02-18 DIAGNOSIS — F5081 Binge eating disorder: Secondary | ICD-10-CM | POA: Diagnosis not present

## 2021-02-18 DIAGNOSIS — F900 Attention-deficit hyperactivity disorder, predominantly inattentive type: Secondary | ICD-10-CM

## 2021-02-18 DIAGNOSIS — F5105 Insomnia due to other mental disorder: Secondary | ICD-10-CM

## 2021-02-18 DIAGNOSIS — F50819 Binge eating disorder, unspecified: Secondary | ICD-10-CM

## 2021-02-18 DIAGNOSIS — F331 Major depressive disorder, recurrent, moderate: Secondary | ICD-10-CM

## 2021-02-18 DIAGNOSIS — F411 Generalized anxiety disorder: Secondary | ICD-10-CM

## 2021-02-18 MED ORDER — CLONIDINE HCL 0.1 MG PO TABS: ORAL_TABLET | ORAL | 1 refills | Status: DC
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
## 2021-02-25 ENCOUNTER — Other Ambulatory Visit: Payer: Self-pay | Admitting: Psychiatry

## 2021-02-25 DIAGNOSIS — F411 Generalized anxiety disorder: Secondary | ICD-10-CM

## 2021-02-25 DIAGNOSIS — F331 Major depressive disorder, recurrent, moderate: Secondary | ICD-10-CM

## 2021-03-06 ENCOUNTER — Other Ambulatory Visit: Payer: Self-pay | Admitting: Psychiatry

## 2021-03-18 ENCOUNTER — Other Ambulatory Visit: Payer: Self-pay | Admitting: Psychiatry

## 2021-03-18 DIAGNOSIS — F4310 Post-traumatic stress disorder, unspecified: Secondary | ICD-10-CM

## 2021-03-18 DIAGNOSIS — F5105 Insomnia due to other mental disorder: Secondary | ICD-10-CM

## 2021-03-20 ENCOUNTER — Other Ambulatory Visit: Payer: Self-pay

## 2021-03-20 ENCOUNTER — Telehealth: Payer: Self-pay | Admitting: Psychiatry

## 2021-03-20 DIAGNOSIS — F5081 Binge eating disorder: Secondary | ICD-10-CM

## 2021-03-20 DIAGNOSIS — F900 Attention-deficit hyperactivity disorder, predominantly inattentive type: Secondary | ICD-10-CM

## 2021-03-20 NOTE — Telephone Encounter (Signed)
Next appt is 04/16/21. Requesting refill on Vyvanse 50 mg called to:  [id number] - [place], [place] - [address]  Phone:  [phone]  Fax:  [phone]

## 2021-03-20 NOTE — Telephone Encounter (Signed)
Pended.

## 2021-03-21 MED ORDER — LISDEXAMFETAMINE DIMESYLATE 50 MG PO CAPS: 50.0000 mg | ORAL_CAPSULE | Freq: Every day | ORAL | 0 refills | Status: DC

## 2021-04-01 ENCOUNTER — Telehealth (INDEPENDENT_AMBULATORY_CARE_PROVIDER_SITE_OTHER): Payer: 59 | Admitting: Family Medicine

## 2021-04-01 ENCOUNTER — Encounter: Payer: Self-pay | Admitting: Family Medicine

## 2021-04-01 DIAGNOSIS — J40 Bronchitis, not specified as acute or chronic: Secondary | ICD-10-CM

## 2021-04-01 DIAGNOSIS — R059 Cough, unspecified: Secondary | ICD-10-CM

## 2021-04-01 MED ORDER — BENZONATATE 100 MG PO CAPS: ORAL_CAPSULE | ORAL | 0 refills | Status: DC

## 2021-04-01 MED ORDER — PREDNISONE 20 MG PO TABS: 40.0000 mg | ORAL_TABLET | Freq: Every day | ORAL | 0 refills | Status: DC

## 2021-04-01 MED ORDER — DOXYCYCLINE HYCLATE 100 MG PO TABS: 100.0000 mg | ORAL_TABLET | Freq: Two times a day (BID) | ORAL | 0 refills | Status: DC
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
## 2021-04-01 NOTE — Patient Instructions (Signed)
-  I sent the medication(s) we discussed to your pharmacy: Meds ordered this encounter  Medications   predniSONE (DELTASONE) 20 MG tablet    Sig: Take 2 tablets (40 mg total) by mouth daily with breakfast.    Dispense:  8 tablet    Refill:  0   doxycycline (VIBRA-TABS) 100 MG tablet    Sig: Take 1 tablet (100 mg total) by mouth 2 (two) times daily.    Dispense:  14 tablet    Refill:  0   benzonatate (TESSALON PERLES) 100 MG capsule    Sig: 1-2 capsules up to twice daily as needed for cough    Dispense:  20 capsule    Refill:  0     I hope you are feeling better soon!  Seek in person care promptly if your symptoms worsen, new concerns arise or you are not improving with treatment.  It was nice to meet you today. I help [facility] out with telemedicine visits on Tuesdays and Thursdays and am available for visits on those days. If you have any concerns or questions following this visit please schedule a follow up visit with your Primary Care doctor or seek care at a local urgent care clinic to avoid delays in care.

## 2021-04-16 ENCOUNTER — Encounter: Payer: Self-pay | Admitting: Psychiatry

## 2021-04-16 ENCOUNTER — Telehealth (INDEPENDENT_AMBULATORY_CARE_PROVIDER_SITE_OTHER): Payer: 59 | Admitting: Psychiatry

## 2021-04-16 ENCOUNTER — Other Ambulatory Visit: Payer: Self-pay

## 2021-04-16 ENCOUNTER — Other Ambulatory Visit: Payer: Self-pay | Admitting: Psychiatry

## 2021-04-16 DIAGNOSIS — F411 Generalized anxiety disorder: Secondary | ICD-10-CM

## 2021-04-16 DIAGNOSIS — F5105 Insomnia due to other mental disorder: Secondary | ICD-10-CM

## 2021-04-16 DIAGNOSIS — F4310 Post-traumatic stress disorder, unspecified: Secondary | ICD-10-CM

## 2021-04-16 DIAGNOSIS — F331 Major depressive disorder, recurrent, moderate: Secondary | ICD-10-CM

## 2021-04-16 DIAGNOSIS — F5081 Binge eating disorder: Secondary | ICD-10-CM

## 2021-04-16 DIAGNOSIS — F900 Attention-deficit hyperactivity disorder, predominantly inattentive type: Secondary | ICD-10-CM

## 2021-04-16 DIAGNOSIS — G4721 Circadian rhythm sleep disorder, delayed sleep phase type: Secondary | ICD-10-CM

## 2021-04-16 MED ORDER — LISDEXAMFETAMINE DIMESYLATE 50 MG PO CAPS: 50.0000 mg | ORAL_CAPSULE | Freq: Every day | ORAL | 0 refills | Status: DC

## 2021-04-16 MED ORDER — CLONAZEPAM 1 MG PO TABS: 1.0000 mg | ORAL_TABLET | Freq: Two times a day (BID) | ORAL | 5 refills | Status: DC | PRN

## 2021-04-16 MED ORDER — SERTRALINE HCL 100 MG PO TABS
200.0000 mg | ORAL_TABLET | Freq: Every day | ORAL | 1 refills | Status: DC
Start: 2021-04-16 — End: 2021-07-08

## 2021-04-16 MED ORDER — CLONIDINE HCL 0.1 MG PO TABS: ORAL_TABLET | ORAL | 1 refills | Status: DC
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
## 2021-04-30 ENCOUNTER — Other Ambulatory Visit: Payer: Self-pay | Admitting: Psychiatry

## 2021-04-30 ENCOUNTER — Other Ambulatory Visit: Payer: Self-pay | Admitting: Family Medicine

## 2021-04-30 DIAGNOSIS — F4310 Post-traumatic stress disorder, unspecified: Secondary | ICD-10-CM

## 2021-04-30 DIAGNOSIS — F5105 Insomnia due to other mental disorder: Secondary | ICD-10-CM

## 2021-05-01 ENCOUNTER — Encounter: Payer: Self-pay | Admitting: Family Medicine

## 2021-05-01 ENCOUNTER — Other Ambulatory Visit: Payer: Self-pay

## 2021-05-01 ENCOUNTER — Telehealth: Payer: Self-pay

## 2021-05-01 ENCOUNTER — Telehealth (INDEPENDENT_AMBULATORY_CARE_PROVIDER_SITE_OTHER): Payer: 59 | Admitting: Family Medicine

## 2021-05-01 VITALS — Temp 101.8°F

## 2021-05-01 DIAGNOSIS — J069 Acute upper respiratory infection, unspecified: Secondary | ICD-10-CM | POA: Diagnosis not present

## 2021-05-01 DIAGNOSIS — U071 COVID-19: Secondary | ICD-10-CM | POA: Diagnosis not present

## 2021-05-01 MED ORDER — HYDROCODONE BIT-HOMATROP MBR 5-1.5 MG/5ML PO SOLN: ORAL | 0 refills | Status: DC

## 2021-05-01 MED ORDER — NIRMATRELVIR/RITONAVIR (PAXLOVID)TABLET: 3.0000 | ORAL_TABLET | Freq: Two times a day (BID) | ORAL | 0 refills | Status: AC
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
## 2021-05-01 NOTE — Telephone Encounter (Signed)
FYI Pt also c/o loss of taste and smell. Appt scheduled for VV.  [facility] - Client TELEPHONE ADVICE RECORD [facility] Patient Name: Sabrina [name] Gender: Female DOB: Apr 30, 1961 Age: 60 Y 47 D Return Phone Number: [phone] (Primary) Address[place]  [address] Client [name] Provider Crissie Sickles - MD Contact Type Call Who Is Calling Patient / Member / Family / Caregiver Call Type Triage / Clinical Relationship To Patient Self Return Phone Number [phone] (Primary) Chief Complaint Sore Throat Reason for Call Symptomatic / Request for Health Information Initial Comment Caller has sore throat, fever of 101.2, coughing and body aches. Caller took a Covid19 test and the line is very faint. Would that be positive? Translation No Nurse Assessment Nurse: Quincy Simmonds, RN, Annita Brod Date/Time Eilene Ghazi Time): 04/30/2021 9:23:32 PM Confirm and document reason for call. If symptomatic, describe symptoms. ---Caller has sore throat, fever of 101.2, coughing and body aches. Caller took a Covid19 test and the line is very faint. symptoms started Monday night Does the patient have any new or worsening symptoms? ---Yes Will a triage be completed? ---Yes Related visit to physician within the last 2 weeks? ---No Does the PT have any chronic conditions? (i.e. diabetes, asthma, this includes High risk factors for pregnancy, etc.) ---Yes List chronic conditions. ---htn, fibromylagia, back pain Is this a behavioral health or substance abuse call? ---No Guidelines Guideline Title Affirmed Question Affirmed Notes Nurse Date/Time (Eastern Time) COVID-19 - Diagnosed or Suspected [1] HIGH RISK for severe COVID complications (e.g., weak immune system, age > 36 years, obesity with BMI 30 or higher, pregnant, chronic lung disease or  other chronic medical Quincy Simmonds, RN, Annita Brod 04/30/2021 9:23:53 PM PLEASE NOTE: All timestamps contained within this report are represented as [place] Standard Time. CONFIDENTIALTY NOTICE: This fax transmission is intended only for the addressee. It contains information that is legally privileged, confidential or otherwise protected from use or disclosure. If you are not the intended recipient, you are strictly prohibited from reviewing, disclosing, copying using or disseminating any of this information or taking any action in reliance on or regarding this information. If you have received this fax in error, please notify us immediately by telephone so that we can arrange for its return to [place]. Phone: [phone], Toll-Free: [phone], Fax: [phone] Page: 2 of 2 Call Id: [id number] Guidelines Guideline Title Affirmed Question Affirmed Notes Nurse Date/Time Eilene Ghazi Time) condition) AND [2] COVID symptoms (e.g., cough, fever) (Exceptions: Already seen by PCP and no new or worsening symptoms.) Disp. Time Eilene Ghazi Time) Disposition Final User 04/30/2021 9:28:50 PM Call PCP within 24 Hours Yes Quincy Simmonds, RN, Annita Brod Caller Disagree/Comply Comply Caller Understands Yes PreDisposition Call Doctor Care Advice Given Per Guideline CALL PCP WITHIN 24 HOURS: * You need to discuss this with your doctor (or NP/PA) within the next 24 hours. * IF OFFICE WILL BE OPEN: Call the office when it opens tomorrow morning. GENERAL CARE ADVICE FOR COVID-19 SYMPTOMS: * Fever: For fever over 101 F (38.3 C), take acetaminophen every 4 to 6 hours (Adults 650 mg) OR ibuprofen every 6 to 8 hours (Adults 400 mg). Before taking any medicine, read all the instructions on the package. Do not take aspirin unless your doctor has prescribed it for you. * Muscle aches, headache, and other pains: Often this comes and goes with the fever. Take acetaminophen every 4 to 6 hours (Adults 650 mg) OR ibuprofen  every 6 to 8 hours  (Adults 400 mg). Before taking any medicine, read all the instructions on the package. CALL BACK IF: * You become worse CARE ADVICE given per COVID-19 - DIAGNOSED OR SUSPECTED (Adult) guideline. Referrals REFERRED TO PCP OFFICE

## 2021-05-01 NOTE — Telephone Encounter (Signed)
Noted  

## 2021-05-02 ENCOUNTER — Telehealth: Payer: Self-pay

## 2021-05-02 NOTE — Telephone Encounter (Signed)
Patient had virtual visit with Dr. Anitra Lauth yesterday. Today, she states there is white spots and is very red "looks like area has been scratched" on her throat. She states the pain is terrible. Is gargling with warm salt water but has not helped.  Please advise.  Patient can be contacted at [phone].  Patient preferred pharmacy, if needed, [facility]

## 2021-05-02 NOTE — Telephone Encounter (Signed)
Please review and advise.

## 2021-05-02 NOTE — Telephone Encounter (Signed)
Patient was following up from her call to office this morning.  She is concerned about upcoming holiday weekend. Told her that Dr. Anitra Lauth is in patient care, and her issue would be addressed accordingly. She understood.

## 2021-05-06 ENCOUNTER — Telehealth: Payer: Self-pay

## 2021-05-06 NOTE — Telephone Encounter (Signed)
[facility] - Client TELEPHONE ADVICE RECORD [facility] Patient Name: Sabrina [name] Gender: Female DOB: Jan 11, 1961 Age: 62 Y 68 D Return Phone Number: [phone] (Primary) Address: [address] City/ State/ Zip: [place]  [address] Client [facility] - Client Client Site [place] [facility] Provider Crissie Sickles - MD Contact Type Call Who Is Calling Patient / Member / Family / Caregiver Call Type Triage / Clinical Relationship To Patient Self Return Phone Number [phone] (Primary) Chief Complaint Sore Throat Reason for Call Symptomatic / Request for Health Information Initial Comment caller states that she has COVID and her throat is hurting and has a red rash in her mouth and red splotches. Feels like she is swallowing broken glass. Translation No Nurse Assessment Nurse: Tamala Julian, RN, Karis Date/Time Eilene Ghazi Time): 05/02/2021 10:55:49 PM Confirm and document reason for call. If symptomatic, describe symptoms. ---Caller states that she has COVID and her throat is hurting and has a red rash in her mouth and red splotches. Feels like she is swallowing broken glass. Pain over 10. Does the patient have any new or worsening symptoms? ---Yes Will a triage be completed? ---Yes Related visit to physician within the last 2 weeks? ---No Does the PT have any chronic conditions? (i.e. diabetes, asthma, this includes High risk factors for pregnancy, etc.) ---Yes List chronic conditions. ---Bronchitis, PTSD, Fibromyalgia Is this a behavioral health or substance abuse call? ---No Guidelines Guideline Title Affirmed Question Affirmed Notes Nurse Date/Time Eilene Ghazi Time) Sore Throat SEVERE (e.g., excruciating) throat pain Tamala Julian, RN, Karis 05/02/2021 10:58:55 PM Disp. Time Eilene Ghazi Time) Disposition Final User 05/02/2021 11:06:00 PM See PCP within 24 Hours Yes Tamala Julian, RN, Celso Sickle PLEASE  NOTE: All timestamps contained within this report are represented as [place] Standard Time. CONFIDENTIALTY NOTICE: This fax transmission is intended only for the addressee. It contains information that is legally privileged, confidential or otherwise protected from use or disclosure. If you are not the intended recipient, you are strictly prohibited from reviewing, disclosing, copying using or disseminating any of this information or taking any action in reliance on or regarding this information. If you have received this fax in error, please notify us immediately by telephone so that we can arrange for its return to [place]. Phone: [phone], Toll-Free: [phone], Fax: [phone] Page: 2 of 2 Call Id: [id number] [place] Disagree/Comply Comply Caller Understands Yes PreDisposition Call Doctor Care Advice Given Per Guideline SEE PCP WITHIN 24 HOURS: * IF OFFICE WILL BE CLOSED: You need to be seen within the next 24 hours. A clinic or an urgent care center is often a good source of care if your doctor's office is closed or you can't get an appointment. SORE THROAT: * Here are some simple things you can do to treat and reduce sore throat pain. * Sip warm chicken broth or apple juice. * Suck on hard candy or an over-the-counter throat lozenge. * Gargle with warm salt water four times a day. To make salt water, put 1/2 teaspoon of salt in 8 oz (240 ml) of warm water. PAIN AND FEVER MEDICINES: * For pain or fever relief, take either acetaminophen or ibuprofen. CALL BACK IF: * You become worse CARE ADVICE given per Sore Throat (Adult) guideline. Referrals REFERRED TO PCP OFFICE

## 2021-05-07 NOTE — Telephone Encounter (Signed)
LM for pt to returncall

## 2021-05-07 NOTE — Telephone Encounter (Signed)
Please review and advise. Pt had VV on 12/29 and given rx for paxlovid medicine

## 2021-05-07 NOTE — Telephone Encounter (Signed)
Pt no longer has insurance and can not pay for appt. Pt states that she has blisters in the back of her throat and does not know what to do about it. Pt is also very hoarse.

## 2021-05-07 NOTE — Telephone Encounter (Signed)
Tell her I am sorry but I cannot determine the best course of treatment if I cannot examine her.

## 2021-05-07 NOTE — Telephone Encounter (Signed)
Okay for in-office visit at 69 today. Her positive COVID was on 12/28 (7 days ago).

## 2021-05-07 NOTE — Telephone Encounter (Signed)
FYI,  Pt would like to let you know she will be requesting her records to be transferred elsewhere for care. She thought her current primary care would be able to assist since aware of situation. Apologized for the delay but advised of procotol. Pt states she is still very much in pain and was told by someone in our office the message would be passed along same day she called. She will just go to an urgent care or ED

## 2021-05-07 NOTE — Telephone Encounter (Signed)
Noted  

## 2021-05-20 ENCOUNTER — Other Ambulatory Visit: Payer: Self-pay | Admitting: Family Medicine

## 2021-05-23 ENCOUNTER — Other Ambulatory Visit: Payer: Self-pay | Admitting: Psychiatry

## 2021-05-23 DIAGNOSIS — F5105 Insomnia due to other mental disorder: Secondary | ICD-10-CM

## 2021-05-23 DIAGNOSIS — F4310 Post-traumatic stress disorder, unspecified: Secondary | ICD-10-CM

## 2021-06-11 DIAGNOSIS — M13831 Other specified arthritis, right wrist: Secondary | ICD-10-CM | POA: Diagnosis not present

## 2021-06-11 DIAGNOSIS — M13842 Other specified arthritis, left hand: Secondary | ICD-10-CM | POA: Diagnosis not present

## 2021-06-11 DIAGNOSIS — M25532 Pain in left wrist: Secondary | ICD-10-CM | POA: Diagnosis not present

## 2021-06-11 DIAGNOSIS — M13832 Other specified arthritis, left wrist: Secondary | ICD-10-CM | POA: Diagnosis not present

## 2021-07-01 ENCOUNTER — Other Ambulatory Visit: Payer: Self-pay | Admitting: Family Medicine

## 2021-07-01 DIAGNOSIS — Z1231 Encounter for screening mammogram for malignant neoplasm of breast: Secondary | ICD-10-CM

## 2021-07-08 ENCOUNTER — Other Ambulatory Visit: Payer: Self-pay

## 2021-07-08 ENCOUNTER — Telehealth: Payer: Self-pay | Admitting: Psychiatry

## 2021-07-08 DIAGNOSIS — F331 Major depressive disorder, recurrent, moderate: Secondary | ICD-10-CM

## 2021-07-08 DIAGNOSIS — F411 Generalized anxiety disorder: Secondary | ICD-10-CM

## 2021-07-08 MED ORDER — SERTRALINE HCL 100 MG PO TABS: 200.0000 mg | ORAL_TABLET | Freq: Every day | ORAL | 0 refills | Status: DC

## 2021-07-08 NOTE — Telephone Encounter (Signed)
Patient has her insurance through [employer] and does not have a membership ID yet to be able to get her medications. She said her representative told her that she would have an ID yesterday and she still doesn't have one.  I told her that Dr. Anitra Lauth prescribes the Celebrex and we don't have samples of the sertraline because it is generic. She said she has been off the sertraline for about 2 weeks and for the first week she did ok, but is now struggling. She is crying all the time, for the first time in 20 years she has had a boyfriend and she thinks the PTSD has scared him away. She isn't sleeping well because the dreams wake her up, and she has no control over her triggers. She denies SI. I wanted to find out if she needed to titrate upwards on the sertraline once she is able to get it, and/or if you had any recommendations.  ?

## 2021-07-08 NOTE — Telephone Encounter (Signed)
If she uses good Rx she can get sertraline from [facility] for $15 for a month supply.  That is what she should do right now ?

## 2021-07-08 NOTE — Telephone Encounter (Signed)
Pt LVM @ 1:50p.  She is asking if we have any samples of Sertraline or Celebrex here in the office.  She has been without for 1-2 wks .  She has new insurance that for some reason is not going through and the med would be $400.  She is "in pieces" with all that's going on right now and she is working with her insurance provider to see why the insurance isn't going through.   ? ?Next appt 4/17 ?

## 2021-07-08 NOTE — Telephone Encounter (Signed)
Sent Rx to [facility] and notified patient.  ?

## 2021-07-26 ENCOUNTER — Other Ambulatory Visit: Payer: Self-pay | Admitting: Family Medicine

## 2021-08-07 ENCOUNTER — Encounter: Payer: 59 | Admitting: Family Medicine

## 2021-08-18 ENCOUNTER — Encounter: Payer: Self-pay | Admitting: Psychiatry

## 2021-08-18 ENCOUNTER — Telehealth (INDEPENDENT_AMBULATORY_CARE_PROVIDER_SITE_OTHER): Payer: 59 | Admitting: Psychiatry

## 2021-08-18 DIAGNOSIS — G4721 Circadian rhythm sleep disorder, delayed sleep phase type: Secondary | ICD-10-CM | POA: Diagnosis not present

## 2021-08-18 DIAGNOSIS — F5081 Binge eating disorder: Secondary | ICD-10-CM | POA: Diagnosis not present

## 2021-08-18 DIAGNOSIS — F4310 Post-traumatic stress disorder, unspecified: Secondary | ICD-10-CM | POA: Diagnosis not present

## 2021-08-18 DIAGNOSIS — F331 Major depressive disorder, recurrent, moderate: Secondary | ICD-10-CM | POA: Diagnosis not present

## 2021-08-18 DIAGNOSIS — R69 Illness, unspecified: Secondary | ICD-10-CM | POA: Diagnosis not present

## 2021-08-18 DIAGNOSIS — F411 Generalized anxiety disorder: Secondary | ICD-10-CM

## 2021-08-18 DIAGNOSIS — F900 Attention-deficit hyperactivity disorder, predominantly inattentive type: Secondary | ICD-10-CM | POA: Diagnosis not present

## 2021-08-18 DIAGNOSIS — F5105 Insomnia due to other mental disorder: Secondary | ICD-10-CM | POA: Diagnosis not present

## 2021-08-18 MED ORDER — CLONAZEPAM 1 MG PO TABS: 1.0000 mg | ORAL_TABLET | Freq: Two times a day (BID) | ORAL | 5 refills | Status: DC | PRN

## 2021-08-18 MED ORDER — LISDEXAMFETAMINE DIMESYLATE 50 MG PO CAPS: 50.0000 mg | ORAL_CAPSULE | Freq: Every day | ORAL | 0 refills | Status: DC

## 2021-08-18 MED ORDER — CLONIDINE HCL 0.1 MG PO TABS: ORAL_TABLET | ORAL | 1 refills | Status: DC

## 2021-08-18 MED ORDER — SERTRALINE HCL 100 MG PO TABS: 200.0000 mg | ORAL_TABLET | Freq: Every day | ORAL | 1 refills | Status: DC

## 2021-08-18 MED ORDER — BUSPIRONE HCL 15 MG PO TABS: 15.0000 mg | ORAL_TABLET | Freq: Two times a day (BID) | ORAL | 1 refills | Status: DC
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
## 2021-08-21 ENCOUNTER — Telehealth: Payer: Self-pay

## 2021-08-21 ENCOUNTER — Ambulatory Visit: Payer: Self-pay

## 2021-08-22 NOTE — Progress Notes (Signed)
PA was initiated and approved today. Patient was notified. Refill is available at the pharmacy.  ?

## 2021-08-22 NOTE — Telephone Encounter (Signed)
Prior Authorization  ?Vyvanse 50 mg ?[employer] ?ID: [id number] ?Initiated and approved 08/22/21 ?Effective 08/22/2021 to 08/23/2022 ?

## 2021-08-23 ENCOUNTER — Other Ambulatory Visit: Payer: Self-pay | Admitting: Family Medicine

## 2021-08-25 NOTE — Telephone Encounter (Signed)
Pt has upcoming appt 4/25 ?

## 2021-08-26 ENCOUNTER — Encounter: Payer: Self-pay | Admitting: Family Medicine

## 2021-08-26 ENCOUNTER — Telehealth: Payer: Self-pay | Admitting: Family Medicine

## 2021-08-26 ENCOUNTER — Ambulatory Visit (INDEPENDENT_AMBULATORY_CARE_PROVIDER_SITE_OTHER): Payer: 59 | Admitting: Family Medicine

## 2021-08-26 VITALS — BP 96/64 | HR 70 | Temp 98.0°F | Ht 63.5 in | Wt 199.4 lb

## 2021-08-26 DIAGNOSIS — E8881 Metabolic syndrome: Secondary | ICD-10-CM

## 2021-08-26 DIAGNOSIS — I10 Essential (primary) hypertension: Secondary | ICD-10-CM

## 2021-08-26 DIAGNOSIS — E7800 Pure hypercholesterolemia, unspecified: Secondary | ICD-10-CM

## 2021-08-26 DIAGNOSIS — Z0000 Encounter for general adult medical examination without abnormal findings: Secondary | ICD-10-CM

## 2021-08-26 MED ORDER — CELECOXIB 200 MG PO CAPS: ORAL_CAPSULE | ORAL | 0 refills | Status: AC

## 2021-08-26 NOTE — Telephone Encounter (Signed)
Generated dismissal letter due to provider documentation from [place] today. 08/26/2021 KO ?

## 2021-08-26 NOTE — Telephone Encounter (Signed)
-----   Message from Tammi Sou, MD sent at 08/26/2021 11:51 AM EDT ----- ?Please send this patient a dismissal letter. ?I saw her today and had to terminate the visit and ask her to leave. ?I am documenting it before I leave today. ?Thank you. ? ?
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
## 2021-08-30 ENCOUNTER — Other Ambulatory Visit: Payer: Self-pay | Admitting: Family Medicine

## 2021-09-08 DIAGNOSIS — M1812 Unilateral primary osteoarthritis of first carpometacarpal joint, left hand: Secondary | ICD-10-CM | POA: Diagnosis not present

## 2021-09-08 DIAGNOSIS — M13832 Other specified arthritis, left wrist: Secondary | ICD-10-CM | POA: Diagnosis not present

## 2021-09-08 DIAGNOSIS — M79644 Pain in right finger(s): Secondary | ICD-10-CM | POA: Diagnosis not present

## 2021-09-08 DIAGNOSIS — M25532 Pain in left wrist: Secondary | ICD-10-CM | POA: Diagnosis not present

## 2021-09-19 ENCOUNTER — Telehealth: Payer: Self-pay | Admitting: Obstetrics & Gynecology

## 2021-09-19 ENCOUNTER — Ambulatory Visit: Payer: Self-pay | Admitting: Obstetrics & Gynecology

## 2021-09-19 NOTE — Telephone Encounter (Signed)
Inbound call from patient stating she was going to be late for her appointment scheduled for today at 4:30pm so we rescheduled her for Monday 09/22/2021.  Did advised patient to go to urgent care if pain worsens.  Patient agreed.

## 2021-09-22 ENCOUNTER — Other Ambulatory Visit (HOSPITAL_COMMUNITY)
Admission: RE | Admit: 2021-09-22 | Discharge: 2021-09-22 | Disposition: A | Discharge status: Home / Self Care | Payer: 59 | Source: Ambulatory Visit | Attending: Obstetrics & Gynecology | Admitting: Obstetrics & Gynecology

## 2021-09-22 ENCOUNTER — Encounter: Payer: Self-pay | Admitting: Obstetrics & Gynecology

## 2021-09-22 ENCOUNTER — Ambulatory Visit (INDEPENDENT_AMBULATORY_CARE_PROVIDER_SITE_OTHER): Payer: 59 | Admitting: Obstetrics & Gynecology

## 2021-09-22 VITALS — BP 122/68 | Temp 98.3°F | Wt 202.0 lb

## 2021-09-22 DIAGNOSIS — Z113 Encounter for screening for infections with a predominantly sexual mode of transmission: Secondary | ICD-10-CM

## 2021-09-22 DIAGNOSIS — R102 Pelvic and perineal pain: Secondary | ICD-10-CM | POA: Insufficient documentation

## 2021-09-22 DIAGNOSIS — R69 Illness, unspecified: Secondary | ICD-10-CM | POA: Diagnosis not present

## 2021-09-22 DIAGNOSIS — N760 Acute vaginitis: Secondary | ICD-10-CM | POA: Diagnosis not present

## 2021-09-22 DIAGNOSIS — B9689 Other specified bacterial agents as the cause of diseases classified elsewhere: Secondary | ICD-10-CM

## 2021-09-22 MED ORDER — TINIDAZOLE 500 MG PO TABS: 1000.0000 mg | ORAL_TABLET | Freq: Two times a day (BID) | ORAL | 0 refills | Status: AC
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
## 2021-09-23 LAB — HEPATITIS B SURFACE ANTIGEN: Hepatitis B Surface Ag: NONREACTIVE

## 2021-09-23 LAB — RPR: RPR Ser Ql: NONREACTIVE

## 2021-09-23 LAB — CYTOLOGY - PAP
Chlamydia: NEGATIVE
Comment: NEGATIVE
Comment: NEGATIVE
Comment: NORMAL
Diagnosis: NEGATIVE
High risk HPV: NEGATIVE
Neisseria Gonorrhea: NEGATIVE

## 2021-09-23 LAB — HEPATITIS C ANTIBODY
Hepatitis C Ab: NONREACTIVE
SIGNAL TO CUT-OFF: 0.07 (ref <1.00)

## 2021-09-23 LAB — HIV ANTIBODY (ROUTINE TESTING W REFLEX): HIV 1&2 Ab, 4th Generation: NONREACTIVE

## 2021-09-24 LAB — URINALYSIS, COMPLETE W/RFL CULTURE
Glucose, UA: NEGATIVE
Hgb urine dipstick: NEGATIVE
Hyaline Cast: NONE SEEN /LPF
Leukocyte Esterase: NEGATIVE
Nitrites, Initial: NEGATIVE
RBC / HPF: NONE SEEN /HPF (ref 0–2)
Specific Gravity, Urine: 1.025 (ref 1.001–1.035)
pH: 5.5 (ref 5.0–8.0)

## 2021-09-24 LAB — URINE CULTURE
MICRO NUMBER:: 13426963
SPECIMEN QUALITY:: ADEQUATE

## 2021-09-24 LAB — CULTURE INDICATED

## 2021-10-01 ENCOUNTER — Telehealth (HOSPITAL_BASED_OUTPATIENT_CLINIC_OR_DEPARTMENT_OTHER): Payer: Self-pay | Admitting: Obstetrics & Gynecology

## 2021-10-01 NOTE — Telephone Encounter (Signed)
61 yo female called on-call with complaint of cramping the last two days and seeing blood when she went to the bathroom.  Extremely nervous about having cancer.  Was just seen in the office with negative pap smear and neg HR HPV 09/22/2021.  Pt not bleeding enough to need to wear a pad.  Actually cannot tell me if this if vaginal or urinary but seeing it in the toilet.  Advised ways to determine if vaginal or urinary.  Precautions given to be seen in the ER if bleeding becomes heavy.  Pt desires phone call in AM if possible.

## 2021-10-02 ENCOUNTER — Encounter: Payer: Self-pay | Admitting: Obstetrics & Gynecology

## 2021-10-02 ENCOUNTER — Ambulatory Visit (INDEPENDENT_AMBULATORY_CARE_PROVIDER_SITE_OTHER): Payer: 59 | Admitting: Obstetrics & Gynecology

## 2021-10-02 VITALS — BP 124/80 | Temp 98.4°F

## 2021-10-02 DIAGNOSIS — R102 Pelvic and perineal pain: Secondary | ICD-10-CM | POA: Diagnosis not present

## 2021-10-02 DIAGNOSIS — R3915 Urgency of urination: Secondary | ICD-10-CM | POA: Diagnosis not present

## 2021-10-02 MED ORDER — SULFAMETHOXAZOLE-TRIMETHOPRIM 800-160 MG PO TABS: 1.0000 | ORAL_TABLET | Freq: Two times a day (BID) | ORAL | 0 refills | Status: AC
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
## 2021-10-02 NOTE — Telephone Encounter (Signed)
Spoke with patient.  Patient reports brown vaginal d/c, is unsure if old blood or vaginal discharge. Reports small blood clots. Reports lower back cramping, similar to "menses cramps". Voiding small amounts of urine with mucous. Denies pain with urination, does "notice a different sensation".  Completed Tindamax last week. Patient expressed concerns not knowing what is going on. Advised OV recommended, patient agreeable, scheduled for today at 3:30pm with Dr. Dellis Filbert. Patient verbalizes understanding and is agreeable.   Routing to provider for final review. Patient is agreeable to disposition. Will close encounter.

## 2021-10-04 LAB — URINALYSIS, COMPLETE W/RFL CULTURE
Bilirubin Urine: NEGATIVE
Glucose, UA: NEGATIVE
Hyaline Cast: NONE SEEN /LPF
Ketones, ur: NEGATIVE
Leukocyte Esterase: NEGATIVE
Nitrites, Initial: NEGATIVE
Specific Gravity, Urine: 1.022 (ref 1.001–1.035)
pH: 5.5 (ref 5.0–8.0)

## 2021-10-04 LAB — URINE CULTURE
MICRO NUMBER:: 13470440
SPECIMEN QUALITY:: ADEQUATE

## 2021-10-04 LAB — CULTURE INDICATED

## 2021-10-06 ENCOUNTER — Other Ambulatory Visit: Payer: Self-pay | Admitting: *Deleted

## 2021-10-06 MED ORDER — CIPROFLOXACIN HCL 500 MG PO TABS: 500.0000 mg | ORAL_TABLET | Freq: Two times a day (BID) | ORAL | 0 refills | Status: DC

## 2021-10-09 ENCOUNTER — Ambulatory Visit (INDEPENDENT_AMBULATORY_CARE_PROVIDER_SITE_OTHER): Payer: 59

## 2021-10-09 DIAGNOSIS — Z1231 Encounter for screening mammogram for malignant neoplasm of breast: Secondary | ICD-10-CM | POA: Diagnosis not present

## 2021-10-12 ENCOUNTER — Encounter: Payer: Self-pay | Admitting: Obstetrics & Gynecology

## 2021-10-25 ENCOUNTER — Encounter: Payer: Self-pay | Admitting: Gastroenterology

## 2021-10-27 ENCOUNTER — Ambulatory Visit (INDEPENDENT_AMBULATORY_CARE_PROVIDER_SITE_OTHER): Payer: 59 | Admitting: Obstetrics & Gynecology

## 2021-10-27 ENCOUNTER — Encounter: Payer: Self-pay | Admitting: Obstetrics & Gynecology

## 2021-10-27 VITALS — BP 142/82 | HR 112

## 2021-10-27 DIAGNOSIS — M25532 Pain in left wrist: Secondary | ICD-10-CM | POA: Diagnosis not present

## 2021-10-27 DIAGNOSIS — M13832 Other specified arthritis, left wrist: Secondary | ICD-10-CM | POA: Diagnosis not present

## 2021-10-27 DIAGNOSIS — R3915 Urgency of urination: Secondary | ICD-10-CM

## 2021-10-27 DIAGNOSIS — M79644 Pain in right finger(s): Secondary | ICD-10-CM | POA: Diagnosis not present

## 2021-10-27 DIAGNOSIS — N951 Menopausal and female climacteric states: Secondary | ICD-10-CM | POA: Diagnosis not present

## 2021-10-27 DIAGNOSIS — N898 Other specified noninflammatory disorders of vagina: Secondary | ICD-10-CM

## 2021-10-27 DIAGNOSIS — M1812 Unilateral primary osteoarthritis of first carpometacarpal joint, left hand: Secondary | ICD-10-CM | POA: Diagnosis not present

## 2021-10-27 DIAGNOSIS — M25531 Pain in right wrist: Secondary | ICD-10-CM | POA: Diagnosis not present

## 2021-10-27 LAB — URINALYSIS, COMPLETE W/RFL CULTURE
Bacteria, UA: NONE SEEN /HPF
Bilirubin Urine: NEGATIVE
Casts: NONE SEEN /LPF
Crystals: NONE SEEN /HPF
Glucose, UA: NEGATIVE
Hgb urine dipstick: NEGATIVE
Hyaline Cast: NONE SEEN /LPF
Ketones, ur: NEGATIVE
Leukocyte Esterase: NEGATIVE
Nitrites, Initial: NEGATIVE
Protein, ur: NEGATIVE
RBC / HPF: NONE SEEN /HPF (ref 0–2)
Specific Gravity, Urine: 1.01 (ref 1.001–1.035)
WBC, UA: NONE SEEN /HPF (ref 0–5)
Yeast: NONE SEEN /HPF
pH: 7 (ref 5.0–8.0)

## 2021-10-27 LAB — WET PREP FOR TRICH, YEAST, CLUE

## 2021-10-27 LAB — NO CULTURE INDICATED

## 2021-11-10 ENCOUNTER — Telehealth: Payer: Self-pay

## 2021-11-10 ENCOUNTER — Other Ambulatory Visit: Payer: Self-pay

## 2021-11-10 ENCOUNTER — Telehealth: Payer: Self-pay | Admitting: Psychiatry

## 2021-11-10 DIAGNOSIS — F900 Attention-deficit hyperactivity disorder, predominantly inattentive type: Secondary | ICD-10-CM

## 2021-11-10 DIAGNOSIS — F5081 Binge eating disorder: Secondary | ICD-10-CM

## 2021-11-10 MED ORDER — LISDEXAMFETAMINE DIMESYLATE 50 MG PO CAPS: 50.0000 mg | ORAL_CAPSULE | Freq: Every day | ORAL | 0 refills | Status: DC

## 2021-11-10 NOTE — Telephone Encounter (Signed)
Next visit is 01/12/22. Requesting a refill on her Vyvanse 50 mg to be called to:  [id number]  Phone:  [phone] Fax:  [phone]

## 2021-11-10 NOTE — Telephone Encounter (Signed)
Pended.

## 2021-11-10 NOTE — Telephone Encounter (Addendum)
Patient is scheduled for u/s on Thursday 11/13/21.  Ultrasound ordered at 09/22/21 office visit. "F/U Pelvic US to further investigate especially the Rt adnexa."  Patient "C/O Rt deep dyspareunia x 1 month."  Patient has seen you twice since it was scheduled and was diagnosed with UTI and treated.  Patient said she is having no more pain. Not currently sexually active.  She asked if you think it is okay for her to cancel the u/s?

## 2021-11-10 NOTE — Telephone Encounter (Signed)
Encounter already opened. Not needed.

## 2021-11-11 ENCOUNTER — Other Ambulatory Visit: Payer: Self-pay

## 2021-11-11 DIAGNOSIS — F5081 Binge eating disorder: Secondary | ICD-10-CM

## 2021-11-11 DIAGNOSIS — F900 Attention-deficit hyperactivity disorder, predominantly inattentive type: Secondary | ICD-10-CM

## 2021-11-11 MED ORDER — LISDEXAMFETAMINE DIMESYLATE 50 MG PO CAPS: 50.0000 mg | ORAL_CAPSULE | Freq: Every day | ORAL | 0 refills | Status: DC

## 2021-11-11 NOTE — Telephone Encounter (Signed)
Per Dr. Marguerita Merles "If the Rt pain/dyspareunia has resolved completely, can cancel the Pelvic US."  Message sent to front desk to relay to her and assist her with rescheduling her appointment as she is unable to come this week due to family emergency.

## 2021-11-13 ENCOUNTER — Ambulatory Visit: Payer: Self-pay | Admitting: Obstetrics & Gynecology

## 2021-11-13 ENCOUNTER — Other Ambulatory Visit: Payer: 59

## 2021-11-13 ENCOUNTER — Other Ambulatory Visit: Payer: Self-pay | Admitting: Obstetrics & Gynecology

## 2021-11-14 ENCOUNTER — Ambulatory Visit: Payer: Self-pay | Admitting: Obstetrics & Gynecology

## 2021-11-30 ENCOUNTER — Other Ambulatory Visit: Payer: Self-pay | Admitting: Family Medicine

## 2021-12-04 ENCOUNTER — Other Ambulatory Visit: Payer: Self-pay | Admitting: Family Medicine

## 2022-01-06 ENCOUNTER — Ambulatory Visit: Payer: Self-pay | Admitting: Obstetrics & Gynecology

## 2022-01-12 ENCOUNTER — Telehealth: Payer: Self-pay | Admitting: Psychiatry

## 2022-01-12 NOTE — Telephone Encounter (Signed)
Pt called reporting she had apt today. Was scheduled for wrong year. Advised pt I will have someone to RTC. She is having a a lot of anxiety and needs apt asap. Nothing available.   Pt # [phone].

## 2022-01-12 NOTE — Telephone Encounter (Signed)
As long as she's not too lightheaded or sleepy with clonidine increase it to 1 tablet twice daily for immediate anxiety.  Increase sertraline to 2 and 1/2 tablets daily to prevent the anxiety and help depression and tears.  But this will take a couple of weeks to work.

## 2022-01-12 NOTE — Telephone Encounter (Signed)
Patient was informed of the recommendations. Also told her not to drive until she sees how she does with increased dose of clonidine.

## 2022-01-12 NOTE — Telephone Encounter (Signed)
Patient thought she had an appt today but it was scheduled incorrectly for 2024. She said she has been struggling since January and did not discuss issues with you at last appt. She is struggling over problems with ex-BF who had a drug problem. Anxiety is 8/10 currently. She has chest tightness, heart pounding, feels like she can't get her lungs full. She cries all the time, has gained 20# from stress eating, despite being on Vyvanse. Difficulty getting to sleep and staying asleep. One cup of caffeine in the AM. She finally got disability and that is helping with some of her stress over financial concerns. She was dismissed from her PCP and has not yet found another PCP.  She is only taking  Buspar 15 mg QPM, though prescribed BID Klonopin 1/4 tab QPM Clonidine Sertraline 200 mg QHS.

## 2022-01-16 ENCOUNTER — Ambulatory Visit: Payer: Self-pay | Admitting: Obstetrics & Gynecology

## 2022-01-16 ENCOUNTER — Telehealth: Payer: Self-pay | Admitting: Psychiatry

## 2022-01-16 NOTE — Telephone Encounter (Signed)
Sabrina [name] is requesting a refill on her Vyvanse but the only pharmacy that has it is [facility], [address], [place], [place] [address] and phone number is [phone]. This location only has Vyvanse 60 mg but it is a chewable pill and they have 30 days. Could this be called in for her and can she use these? Phone number is [phone].

## 2022-01-19 NOTE — Telephone Encounter (Signed)
Pharmacy doesn't have any dose/formulation available.

## 2022-01-19 NOTE — Telephone Encounter (Signed)
Last filled 8/3

## 2022-01-20 NOTE — Telephone Encounter (Signed)
[facility], though they have in stock. [facility] on [address] have in [place], asked patient to call and see if they had enough and let us know and we would send in Rx.

## 2022-01-29 ENCOUNTER — Other Ambulatory Visit: Payer: Self-pay | Admitting: Psychiatry

## 2022-01-29 DIAGNOSIS — F4310 Post-traumatic stress disorder, unspecified: Secondary | ICD-10-CM

## 2022-01-29 DIAGNOSIS — F5105 Insomnia due to other mental disorder: Secondary | ICD-10-CM

## 2022-01-29 NOTE — Telephone Encounter (Signed)
Need to verify dose and pharmacy.

## 2022-01-30 ENCOUNTER — Other Ambulatory Visit: Payer: Self-pay

## 2022-01-30 DIAGNOSIS — F900 Attention-deficit hyperactivity disorder, predominantly inattentive type: Secondary | ICD-10-CM

## 2022-01-30 DIAGNOSIS — F5081 Binge eating disorder: Secondary | ICD-10-CM

## 2022-01-30 MED ORDER — SERTRALINE HCL 100 MG PO TABS: 250.0000 mg | ORAL_TABLET | Freq: Every day | ORAL | 0 refills | Status: DC

## 2022-01-30 MED ORDER — LISDEXAMFETAMINE DIMESYLATE 50 MG PO CAPS: 50.0000 mg | ORAL_CAPSULE | Freq: Every day | ORAL | 0 refills | Status: DC

## 2022-01-30 NOTE — Telephone Encounter (Signed)
Rx for 2-1/2 tabs of sertraline sent for a 30-day supply to [facility].  She is unable to find 50 mg Vyvanse at [facility]. She [facility] [place] since she should be back in town next week.

## 2022-01-30 NOTE — Telephone Encounter (Signed)
Patient lvm today requesting advisement on medication .See telephone encounter 9/15 and 9/18. She also needs a refill on the Sertraline due to dosage increase she stated per provider. Last seen 4/17, with no follow up. This was due to the cancellation. The wrong year was scheduled.  Contact information # [phone]

## 2022-01-30 NOTE — Telephone Encounter (Signed)
[facility] [place], confirmed in stock.

## 2022-02-23 ENCOUNTER — Other Ambulatory Visit: Payer: Self-pay | Admitting: Psychiatry

## 2022-02-24 NOTE — Telephone Encounter (Signed)
Please call patient to schedule an appt.-

## 2022-02-26 NOTE — Telephone Encounter (Signed)
Pt had an appt for 01/13/23 instead of if being 01/12/22. She has rescheduled to Dr Casimiro Needle first available in Jan and I've added her to wait list with high priority because of the scheduling error on our end.  Pls send in meds.  Next appt 1/17

## 2022-03-04 ENCOUNTER — Other Ambulatory Visit: Payer: Self-pay | Admitting: Family Medicine

## 2022-03-10 ENCOUNTER — Other Ambulatory Visit (HOSPITAL_COMMUNITY)
Admission: RE | Admit: 2022-03-10 | Discharge: 2022-03-10 | Disposition: A | Discharge status: Home / Self Care | Payer: Medicare Other | Source: Ambulatory Visit | Attending: Obstetrics & Gynecology | Admitting: Obstetrics & Gynecology

## 2022-03-10 ENCOUNTER — Encounter: Payer: Self-pay | Admitting: Obstetrics & Gynecology

## 2022-03-10 ENCOUNTER — Ambulatory Visit (INDEPENDENT_AMBULATORY_CARE_PROVIDER_SITE_OTHER): Payer: Medicare Other | Admitting: Obstetrics & Gynecology

## 2022-03-10 VITALS — BP 138/80 | HR 94 | Resp 98 | Ht 61.75 in | Wt 214.0 lb

## 2022-03-10 DIAGNOSIS — Z9289 Personal history of other medical treatment: Secondary | ICD-10-CM | POA: Diagnosis not present

## 2022-03-10 DIAGNOSIS — Z6839 Body mass index (BMI) 39.0-39.9, adult: Secondary | ICD-10-CM

## 2022-03-10 DIAGNOSIS — Z01419 Encounter for gynecological examination (general) (routine) without abnormal findings: Secondary | ICD-10-CM

## 2022-03-10 DIAGNOSIS — D069 Carcinoma in situ of cervix, unspecified: Secondary | ICD-10-CM | POA: Diagnosis not present

## 2022-03-10 DIAGNOSIS — Z780 Asymptomatic menopausal state: Secondary | ICD-10-CM

## 2022-03-10 NOTE — Progress Notes (Signed)
Sabrina [name] 1961/03/12 [id number]   History:    61 y.o. G2P2L2 Divorced   RP:  Established patient presenting for annual gyn exam    HPI: Postmenopause x 2017, well on no HRT.  No PMB.  Occasional Rt lower abdominal discomfort.  Abstinent. 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  Pap Neg 09/2021.  Pap reflex done. Breasts normal.  Mammo Neg 10/2021. BMI 39.46. Recommend starting back with Weight Watcher.  Colono 09/2016.  BD normal 10/2016.  Health labs with Fam MD.   Past medical history,surgical history, family history and social history were all reviewed and documented in the EPIC chart.  Gynecologic History No LMP recorded (lmp unknown). Patient is postmenopausal.  Obstetric History OB History  Gravida Para Term Preterm AB Living  '2 2   2   2  '$ SAB IAB Ectopic Multiple Live Births               # Outcome Date GA Lbr Len/2nd Weight Sex Delivery Anes PTL Lv  2 Preterm           1 Preterm              ROS: A ROS was performed and pertinent positives and negatives are included in the history. GENERAL: No fevers or chills. HEENT: No change in vision, no earache, sore throat or sinus congestion. NECK: No pain or stiffness. CARDIOVASCULAR: No chest pain or pressure. No palpitations. PULMONARY: No shortness of breath, cough or wheeze. GASTROINTESTINAL: No abdominal pain, nausea, vomiting or diarrhea, melena or bright red blood per rectum. GENITOURINARY: No urinary frequency, urgency, hesitancy or dysuria. MUSCULOSKELETAL: No joint or muscle pain, no back pain, no recent trauma. DERMATOLOGIC: No rash, no itching, no lesions. ENDOCRINE: No polyuria, polydipsia, no heat or cold intolerance. No recent change in weight. HEMATOLOGICAL: No anemia or easy bruising or bleeding. NEUROLOGIC: No headache, seizures, numbness, tingling or weakness. PSYCHIATRIC: No depression, no loss of interest in normal activity or change in sleep pattern.     Exam:   BP 138/80   Pulse 94   Resp (!) 98   Ht 5' 1.75"  (1.568 m)   Wt 214 lb (97.1 kg)   LMP  (LMP Unknown)   BMI 39.46 kg/m   Body mass index is 39.46 kg/m.  General appearance : Well developed well nourished female. No acute distress HEENT: Eyes: no retinal hemorrhage or exudates,  Neck supple, trachea midline, no carotid bruits, no thyroidmegaly Lungs: Clear to auscultation, no rhonchi or wheezes, or rib retractions  Heart: Regular rate and rhythm, no murmurs or gallops Breast:Examined in sitting and supine position were symmetrical in appearance, no palpable masses or tenderness,  no skin retraction, no nipple inversion, no nipple discharge, no skin discoloration, no axillary or supraclavicular lymphadenopathy Abdomen: no palpable masses or tenderness, no rebound or guarding Extremities: no edema or skin discoloration or tenderness  Pelvic: Vulva: Normal             Vagina: No gross lesions or discharge  Cervix: No gross lesions or discharge.  Pap reflex done.  Uterus  AV, normal size, shape and consistency, non-tender and mobile  Adnexa  Without masses or tenderness  Anus: Normal   Assessment/Plan:  61 y.o. female for annual exam   1. Encounter for routine gynecological examination with Papanicolaou smear of cervix Postmenopause x 2017, well on no HRT.  No PMB.  Occasional Rt lower abdominal discomfort.  Abstinent. 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  Pap Neg  09/2021.  Pap reflex done. Breasts normal.  Mammo Neg 10/2021. BMI 39.46. Recommend starting back with Weight Watcher.  Colono 09/2016.  BD normal 10/2016.  Health labs with Fam MD. - Cytology - PAP( [facility])  2. 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  3. Postmenopause Postmenopause x 2017, well on no HRT.  No PMB.  Occasional Rt lower abdominal discomfort.  Abstinent.   4. Personal history of other medical treatment  5. Class 2 severe obesity due to excess calories with serious comorbidity and body mass index (BMI) of 39.0 to 39.9 in adult  [facility])  Other orders - methocarbamol (ROBAXIN) 500 MG tablet; Take 500 mg by mouth daily.   Princess Bruins MD, 3:35 PM 03/10/2022

## 2022-03-12 ENCOUNTER — Other Ambulatory Visit: Payer: Self-pay

## 2022-03-12 ENCOUNTER — Telehealth: Payer: Self-pay | Admitting: Psychiatry

## 2022-03-12 DIAGNOSIS — F5081 Binge eating disorder: Secondary | ICD-10-CM

## 2022-03-12 DIAGNOSIS — F900 Attention-deficit hyperactivity disorder, predominantly inattentive type: Secondary | ICD-10-CM

## 2022-03-12 LAB — CYTOLOGY - PAP
Adequacy: ABSENT
Diagnosis: NEGATIVE

## 2022-03-12 MED ORDER — LISDEXAMFETAMINE DIMESYLATE 50 MG PO CAPS: 50.0000 mg | ORAL_CAPSULE | Freq: Every day | ORAL | 0 refills | Status: DC

## 2022-03-12 NOTE — Telephone Encounter (Signed)
Pended.

## 2022-03-12 NOTE — Telephone Encounter (Signed)
Patient lvm at 3:54 with refill request for generic Vyvanse '50mg'$ . She also noted that her new insurance has required her to change her pharmacy and would like prescriptions to go to new pharmacy. Ph: [phone] Appt 1/17 Pharmacy [facility] [place], [place]

## 2022-03-27 ENCOUNTER — Other Ambulatory Visit: Payer: Self-pay | Admitting: Psychiatry

## 2022-03-27 DIAGNOSIS — F4310 Post-traumatic stress disorder, unspecified: Secondary | ICD-10-CM

## 2022-03-27 DIAGNOSIS — F411 Generalized anxiety disorder: Secondary | ICD-10-CM

## 2022-03-30 ENCOUNTER — Other Ambulatory Visit: Payer: Self-pay | Admitting: Family Medicine

## 2022-04-03 ENCOUNTER — Other Ambulatory Visit: Payer: Self-pay | Admitting: Family Medicine

## 2022-05-05 ENCOUNTER — Telehealth: Payer: Self-pay | Admitting: Psychiatry

## 2022-05-05 ENCOUNTER — Other Ambulatory Visit: Payer: Self-pay

## 2022-05-05 DIAGNOSIS — F5081 Binge eating disorder: Secondary | ICD-10-CM

## 2022-05-05 DIAGNOSIS — F900 Attention-deficit hyperactivity disorder, predominantly inattentive type: Secondary | ICD-10-CM

## 2022-05-05 MED ORDER — LISDEXAMFETAMINE DIMESYLATE 50 MG PO CAPS: 50.0000 mg | ORAL_CAPSULE | Freq: Every day | ORAL | 0 refills | Status: DC

## 2022-05-05 NOTE — Telephone Encounter (Signed)
Pended.

## 2022-05-05 NOTE — Telephone Encounter (Signed)
Sabrina [name] called at 10:00 to request refill of her Vyvanse.  Appt 1/17.  Send to [facility] in [place].

## 2022-05-20 ENCOUNTER — Encounter: Payer: Self-pay | Admitting: Psychiatry

## 2022-05-20 ENCOUNTER — Telehealth (INDEPENDENT_AMBULATORY_CARE_PROVIDER_SITE_OTHER): Payer: Medicare Other | Admitting: Psychiatry

## 2022-05-20 DIAGNOSIS — F411 Generalized anxiety disorder: Secondary | ICD-10-CM

## 2022-05-20 DIAGNOSIS — F331 Major depressive disorder, recurrent, moderate: Secondary | ICD-10-CM

## 2022-05-20 DIAGNOSIS — F4310 Post-traumatic stress disorder, unspecified: Secondary | ICD-10-CM | POA: Diagnosis not present

## 2022-05-20 DIAGNOSIS — F900 Attention-deficit hyperactivity disorder, predominantly inattentive type: Secondary | ICD-10-CM

## 2022-05-20 DIAGNOSIS — F5081 Binge eating disorder: Secondary | ICD-10-CM | POA: Diagnosis not present

## 2022-05-20 DIAGNOSIS — G4721 Circadian rhythm sleep disorder, delayed sleep phase type: Secondary | ICD-10-CM

## 2022-05-20 DIAGNOSIS — F5105 Insomnia due to other mental disorder: Secondary | ICD-10-CM

## 2022-05-20 MED ORDER — CLONIDINE HCL 0.1 MG PO TABS: ORAL_TABLET | ORAL | 1 refills | Status: DC
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
## 2022-05-29 ENCOUNTER — Telehealth: Payer: Medicare Other | Admitting: Emergency Medicine

## 2022-05-29 ENCOUNTER — Ambulatory Visit
Admission: RE | Admit: 2022-05-29 | Discharge: 2022-05-29 | Disposition: A | Discharge status: Home / Self Care | Payer: Medicare Other | Source: Ambulatory Visit | Attending: Urgent Care | Admitting: Urgent Care

## 2022-05-29 ENCOUNTER — Ambulatory Visit (INDEPENDENT_AMBULATORY_CARE_PROVIDER_SITE_OTHER): Payer: Medicare Other

## 2022-05-29 VITALS — BP 166/103 | HR 97 | Temp 99.2°F | Resp 16

## 2022-05-29 DIAGNOSIS — J40 Bronchitis, not specified as acute or chronic: Secondary | ICD-10-CM

## 2022-05-29 DIAGNOSIS — J209 Acute bronchitis, unspecified: Secondary | ICD-10-CM

## 2022-05-29 DIAGNOSIS — J329 Chronic sinusitis, unspecified: Secondary | ICD-10-CM

## 2022-05-29 MED ORDER — AMOXICILLIN 875 MG PO TABS: 875.0000 mg | ORAL_TABLET | Freq: Two times a day (BID) | ORAL | 0 refills | Status: DC

## 2022-05-29 MED ORDER — BENZONATATE 100 MG PO CAPS: 100.0000 mg | ORAL_CAPSULE | Freq: Two times a day (BID) | ORAL | 0 refills | Status: DC | PRN

## 2022-05-29 MED ORDER — LEVOCETIRIZINE DIHYDROCHLORIDE 5 MG PO TABS: 5.0000 mg | ORAL_TABLET | Freq: Every evening | ORAL | 0 refills | Status: AC

## 2022-05-29 MED ORDER — PROMETHAZINE-DM 6.25-15 MG/5ML PO SYRP: 5.0000 mL | ORAL_SOLUTION | Freq: Three times a day (TID) | ORAL | 0 refills | Status: DC | PRN

## 2022-05-29 MED ORDER — PREDNISONE 20 MG PO TABS: 40.0000 mg | ORAL_TABLET | Freq: Every day | ORAL | 0 refills | Status: DC

## 2022-05-29 NOTE — Discharge Instructions (Addendum)
I am managing your sinobronchitis with amoxicillin and prednisone. Please continue to use albuterol inhaler as needed. I have also provided you with cough medication.  If your symptoms worsen then please come back to our clinic for recheck.
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
## 2022-05-29 NOTE — ED Triage Notes (Signed)
States she's had recurrent episodes of bronchitis and is requesting we give her something to make it go away over the next week before her son's wedding.

## 2022-05-29 NOTE — Progress Notes (Signed)
We are sorry that you are not feeling well.  Here is how we plan to help!  Based on on what you shared with me I believe you most likely have A cough due to a virus.  This is called viral bronchitis and is best treated by rest, plenty of fluids and control of the cough.  You may use Ibuprofen or Tylenol as directed to help your symptoms.    Antibiotics are not indicated to treat viruses and are not recommended by the Infectious Disease Society of [place] unless you have severe symptoms (including high fever) or you have symptoms for more than 10 days. If you still have symptoms after 10 days, antibiotics should be considered.     In addition you may use A prescription cough medication called Tessalon Perles '100mg'$ . You may take 1-2 capsules every 8 hours as needed for your cough.  I also recommend using Mucinex for your nasal congestion as well as nasal saline spray or saline irrigation. Try using saline irrigation, such as with a neti pot, several times a day while you are sick. Many neti pots come with salt packets premeasured to use to make saline. If you use your own salt, make sure it is kosher salt or sea salt (don't use table salt as it has iodine in it and you don't need that in your nose). Use distilled water to make saline. If you mix your own saline using your own salt, the recipe is 1/4 teaspoon salt in 1 cup warm water. Using saline irrigation can help prevent and treat sinus infections.   Please also continue using your albuterol inhaler.   If this treatment plan does not help you heal, you will need to be seen in person.   From your responses in the eVisit questionnaire you describe inflammation in the upper respiratory tract which is causing a significant cough.  This is commonly called Bronchitis and has four common causes:   Allergies Viral Infections Acid Reflux Bacterial Infection Allergies, viruses and acid reflux are treated by controlling symptoms or eliminating the cause. An  example might be a cough caused by taking certain blood pressure medications. You stop the cough by changing the medication. Another example might be a cough caused by acid reflux. Controlling the reflux helps control the cough.  USE OF BRONCHODILATOR ("RESCUE") INHALERS: There is a risk from using your bronchodilator too frequently.  The risk is that over-reliance on a medication which only relaxes the muscles surrounding the breathing tubes can reduce the effectiveness of medications prescribed to reduce swelling and congestion of the tubes themselves.  Although you feel brief relief from the bronchodilator inhaler, your asthma may actually be worsening with the tubes becoming more swollen and filled with mucus.  This can delay other crucial treatments, such as oral steroid medications. If you need to use a bronchodilator inhaler daily, several times per day, you should discuss this with your provider.  There are probably better treatments that could be used to keep your asthma under control.     HOME CARE Only take medications as instructed by your medical team. Complete the entire course of an antibiotic. Drink plenty of fluids and get plenty of rest. Avoid close contacts especially the very young and the elderly Cover your mouth if you cough or cough into your sleeve. Always remember to wash your hands A steam or ultrasonic humidifier can help congestion.   GET HELP RIGHT AWAY IF: You develop worsening fever. You become short of  breath You cough up blood. Your symptoms persist after you have completed your treatment plan MAKE SURE YOU  Understand these instructions. Will watch your condition. Will get help right away if you are not doing well or get worse.    Thank you for choosing an e-visit.  Your e-visit answers were reviewed by a board certified advanced clinical practitioner to complete your personal care plan. Depending upon the condition, your plan could have included both over  the counter or prescription medications.  Please review your pharmacy choice. Make sure the pharmacy is open so you can pick up prescription now. If there is a problem, you may contact your provider through [employer] and have the prescription routed to another pharmacy.  Your safety is important to [place]. If you have drug allergies check your prescription carefully.   For the next 24 hours you can use MyChart to ask questions about today's visit, request a non-urgent call back, or ask for a work or school excuse. You will get an email in the next two days asking about your experience. I hope that your e-visit has been valuable and will speed your recovery.  I have spent 5 minutes in review of e-visit questionnaire, review and updating patient chart, medical decision making and response to patient.   Willeen Cass, PhD, FNP-BC

## 2022-06-17 ENCOUNTER — Other Ambulatory Visit: Payer: Self-pay

## 2022-06-17 ENCOUNTER — Telehealth: Payer: Self-pay | Admitting: Psychiatry

## 2022-06-17 DIAGNOSIS — F900 Attention-deficit hyperactivity disorder, predominantly inattentive type: Secondary | ICD-10-CM

## 2022-06-17 DIAGNOSIS — F5081 Binge eating disorder: Secondary | ICD-10-CM

## 2022-06-17 MED ORDER — LISDEXAMFETAMINE DIMESYLATE 50 MG PO CAPS: 50.0000 mg | ORAL_CAPSULE | Freq: Every day | ORAL | 0 refills | Status: DC

## 2022-06-17 NOTE — Telephone Encounter (Signed)
Pt called and said that she needs a refill on her generic vyvanse 50 mg. The pharmacy is [facility] in [place]

## 2022-06-17 NOTE — Telephone Encounter (Signed)
Pended.

## 2022-06-21 ENCOUNTER — Telehealth: Payer: Self-pay

## 2022-06-22 NOTE — Telephone Encounter (Signed)
Prior Authorization Vyvanse 50 mg  #30 [employer]  Approved Effective:  Effective from 06/21/2022 through 06/22/2023

## 2022-07-09 ENCOUNTER — Other Ambulatory Visit: Payer: Self-pay | Admitting: Psychiatry

## 2022-07-14 NOTE — Telephone Encounter (Signed)
Noted  

## 2022-07-19 NOTE — Telephone Encounter (Signed)
Prior Authorization submitted for Sertraline 100 mg take 2.5 tablets daily #225/90 day with

## 2022-07-20 NOTE — Telephone Encounter (Signed)
Patient notified

## 2022-07-20 NOTE — Telephone Encounter (Signed)
Rodney at [facility] LVM @ 9:34a.  He said the Sertraline 100mg  has been approved starting 07/20/22 for a year.  If you need to call them back, the number is [phone] opt 5.  Next appt 5/1

## 2022-09-02 ENCOUNTER — Ambulatory Visit: Payer: Medicare Other | Admitting: Psychiatry

## 2022-09-02 ENCOUNTER — Encounter: Payer: Self-pay | Admitting: Psychiatry

## 2022-09-02 DIAGNOSIS — F411 Generalized anxiety disorder: Secondary | ICD-10-CM | POA: Diagnosis not present

## 2022-09-02 DIAGNOSIS — F5081 Binge eating disorder: Secondary | ICD-10-CM | POA: Diagnosis not present

## 2022-09-02 DIAGNOSIS — F4310 Post-traumatic stress disorder, unspecified: Secondary | ICD-10-CM

## 2022-09-02 DIAGNOSIS — F5105 Insomnia due to other mental disorder: Secondary | ICD-10-CM | POA: Diagnosis not present

## 2022-09-02 DIAGNOSIS — F900 Attention-deficit hyperactivity disorder, predominantly inattentive type: Secondary | ICD-10-CM | POA: Diagnosis not present

## 2022-09-02 DIAGNOSIS — F50819 Binge eating disorder, unspecified: Secondary | ICD-10-CM

## 2022-09-02 MED ORDER — TRAZODONE HCL 50 MG PO TABS: ORAL_TABLET | ORAL | 1 refills | Status: DC

## 2022-09-02 MED ORDER — LISDEXAMFETAMINE DIMESYLATE 60 MG PO CAPS: 60.0000 mg | ORAL_CAPSULE | ORAL | 0 refills | Status: DC

## 2022-09-02 MED ORDER — CLONIDINE HCL 0.1 MG PO TABS: ORAL_TABLET | ORAL | 1 refills | Status: DC

## 2022-09-02 MED ORDER — LISDEXAMFETAMINE DIMESYLATE 60 MG PO CAPS: 60.0000 mg | ORAL_CAPSULE | Freq: Every day | ORAL | 0 refills | Status: DC

## 2022-09-02 MED ORDER — BUSPIRONE HCL 15 MG PO TABS: 15.0000 mg | ORAL_TABLET | Freq: Two times a day (BID) | ORAL | 0 refills | Status: DC

## 2022-09-02 MED ORDER — SERTRALINE HCL 100 MG PO TABS: 250.0000 mg | ORAL_TABLET | Freq: Every day | ORAL | 1 refills | Status: DC
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
## 2022-09-04 ENCOUNTER — Other Ambulatory Visit: Payer: Self-pay | Admitting: Psychiatry

## 2022-09-04 DIAGNOSIS — F4310 Post-traumatic stress disorder, unspecified: Secondary | ICD-10-CM

## 2022-09-04 DIAGNOSIS — F411 Generalized anxiety disorder: Secondary | ICD-10-CM

## 2022-09-10 ENCOUNTER — Other Ambulatory Visit (HOSPITAL_BASED_OUTPATIENT_CLINIC_OR_DEPARTMENT_OTHER): Payer: Self-pay

## 2022-09-10 ENCOUNTER — Telehealth: Payer: Self-pay | Admitting: Psychiatry

## 2022-09-10 ENCOUNTER — Other Ambulatory Visit: Payer: Self-pay | Admitting: Psychiatry

## 2022-09-10 DIAGNOSIS — F900 Attention-deficit hyperactivity disorder, predominantly inattentive type: Secondary | ICD-10-CM

## 2022-09-10 DIAGNOSIS — F5081 Binge eating disorder: Secondary | ICD-10-CM

## 2022-09-10 MED ORDER — LISDEXAMFETAMINE DIMESYLATE 60 MG PO CAPS
60.0000 mg | ORAL_CAPSULE | Freq: Every day | ORAL | 0 refills | Status: DC
  Filled 2022-09-10: qty 30, 30d supply, fill #0

## 2022-09-10 NOTE — Telephone Encounter (Signed)
Pt said that the [facility] doesn't have vyvanse in stock. Pt would like the script [facility] on [address]

## 2022-09-11 ENCOUNTER — Other Ambulatory Visit (HOSPITAL_BASED_OUTPATIENT_CLINIC_OR_DEPARTMENT_OTHER): Payer: Self-pay

## 2022-09-14 ENCOUNTER — Other Ambulatory Visit (HOSPITAL_BASED_OUTPATIENT_CLINIC_OR_DEPARTMENT_OTHER): Payer: Self-pay

## 2022-10-05 ENCOUNTER — Other Ambulatory Visit: Payer: Self-pay | Admitting: Psychiatry

## 2022-10-05 DIAGNOSIS — F411 Generalized anxiety disorder: Secondary | ICD-10-CM

## 2022-10-05 DIAGNOSIS — F4310 Post-traumatic stress disorder, unspecified: Secondary | ICD-10-CM

## 2022-10-21 ENCOUNTER — Telehealth: Payer: Self-pay | Admitting: Psychiatry

## 2022-10-22 NOTE — Telephone Encounter (Signed)
error 

## 2022-11-09 ENCOUNTER — Other Ambulatory Visit (HOSPITAL_BASED_OUTPATIENT_CLINIC_OR_DEPARTMENT_OTHER): Payer: Self-pay

## 2022-11-09 ENCOUNTER — Other Ambulatory Visit: Payer: Self-pay | Admitting: Psychiatry

## 2022-11-09 DIAGNOSIS — F5081 Binge eating disorder: Secondary | ICD-10-CM

## 2022-11-09 DIAGNOSIS — F900 Attention-deficit hyperactivity disorder, predominantly inattentive type: Secondary | ICD-10-CM

## 2022-11-09 MED ORDER — LISDEXAMFETAMINE DIMESYLATE 60 MG PO CAPS
60.0000 mg | ORAL_CAPSULE | Freq: Every day | ORAL | 0 refills | Status: DC
  Filled 2022-11-09 – 2022-11-24 (×2): qty 30, 30d supply, fill #0

## 2022-11-24 ENCOUNTER — Other Ambulatory Visit (HOSPITAL_BASED_OUTPATIENT_CLINIC_OR_DEPARTMENT_OTHER): Payer: Self-pay

## 2022-11-27 ENCOUNTER — Other Ambulatory Visit (HOSPITAL_BASED_OUTPATIENT_CLINIC_OR_DEPARTMENT_OTHER): Payer: Self-pay

## 2022-11-27 ENCOUNTER — Other Ambulatory Visit: Payer: Self-pay | Admitting: Psychiatry

## 2022-11-27 DIAGNOSIS — F411 Generalized anxiety disorder: Secondary | ICD-10-CM

## 2022-11-27 DIAGNOSIS — F4310 Post-traumatic stress disorder, unspecified: Secondary | ICD-10-CM

## 2022-12-10 ENCOUNTER — Encounter: Payer: Self-pay | Admitting: Psychiatry

## 2022-12-10 ENCOUNTER — Telehealth: Payer: Medicare Other | Admitting: Psychiatry

## 2022-12-10 DIAGNOSIS — F411 Generalized anxiety disorder: Secondary | ICD-10-CM | POA: Diagnosis not present

## 2022-12-10 DIAGNOSIS — F4310 Post-traumatic stress disorder, unspecified: Secondary | ICD-10-CM

## 2022-12-10 DIAGNOSIS — F5105 Insomnia due to other mental disorder: Secondary | ICD-10-CM

## 2022-12-10 DIAGNOSIS — F5081 Binge eating disorder: Secondary | ICD-10-CM

## 2022-12-10 DIAGNOSIS — F331 Major depressive disorder, recurrent, moderate: Secondary | ICD-10-CM

## 2022-12-10 DIAGNOSIS — G4721 Circadian rhythm sleep disorder, delayed sleep phase type: Secondary | ICD-10-CM

## 2022-12-10 DIAGNOSIS — F900 Attention-deficit hyperactivity disorder, predominantly inattentive type: Secondary | ICD-10-CM

## 2022-12-10 MED ORDER — LISDEXAMFETAMINE DIMESYLATE 60 MG PO CAPS: 60.0000 mg | ORAL_CAPSULE | Freq: Every day | ORAL | 0 refills | Status: DC

## 2022-12-10 MED ORDER — BUSPIRONE HCL 30 MG PO TABS: 30.0000 mg | ORAL_TABLET | Freq: Two times a day (BID) | ORAL | 0 refills | Status: DC

## 2022-12-10 MED ORDER — LISDEXAMFETAMINE DIMESYLATE 60 MG PO CAPS: 60.0000 mg | ORAL_CAPSULE | ORAL | 0 refills | Status: DC
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
## 2023-01-02 IMAGING — DX DG FOOT COMPLETE 3+V*L*
3 series · 3 of 3 positions shown · non-contrast
Comparison: February 25, 2017.

CLINICAL DATA: Chronic left foot pain.

EXAM:
LEFT FOOT - COMPLETE 3+ VIEW

[foot ap]
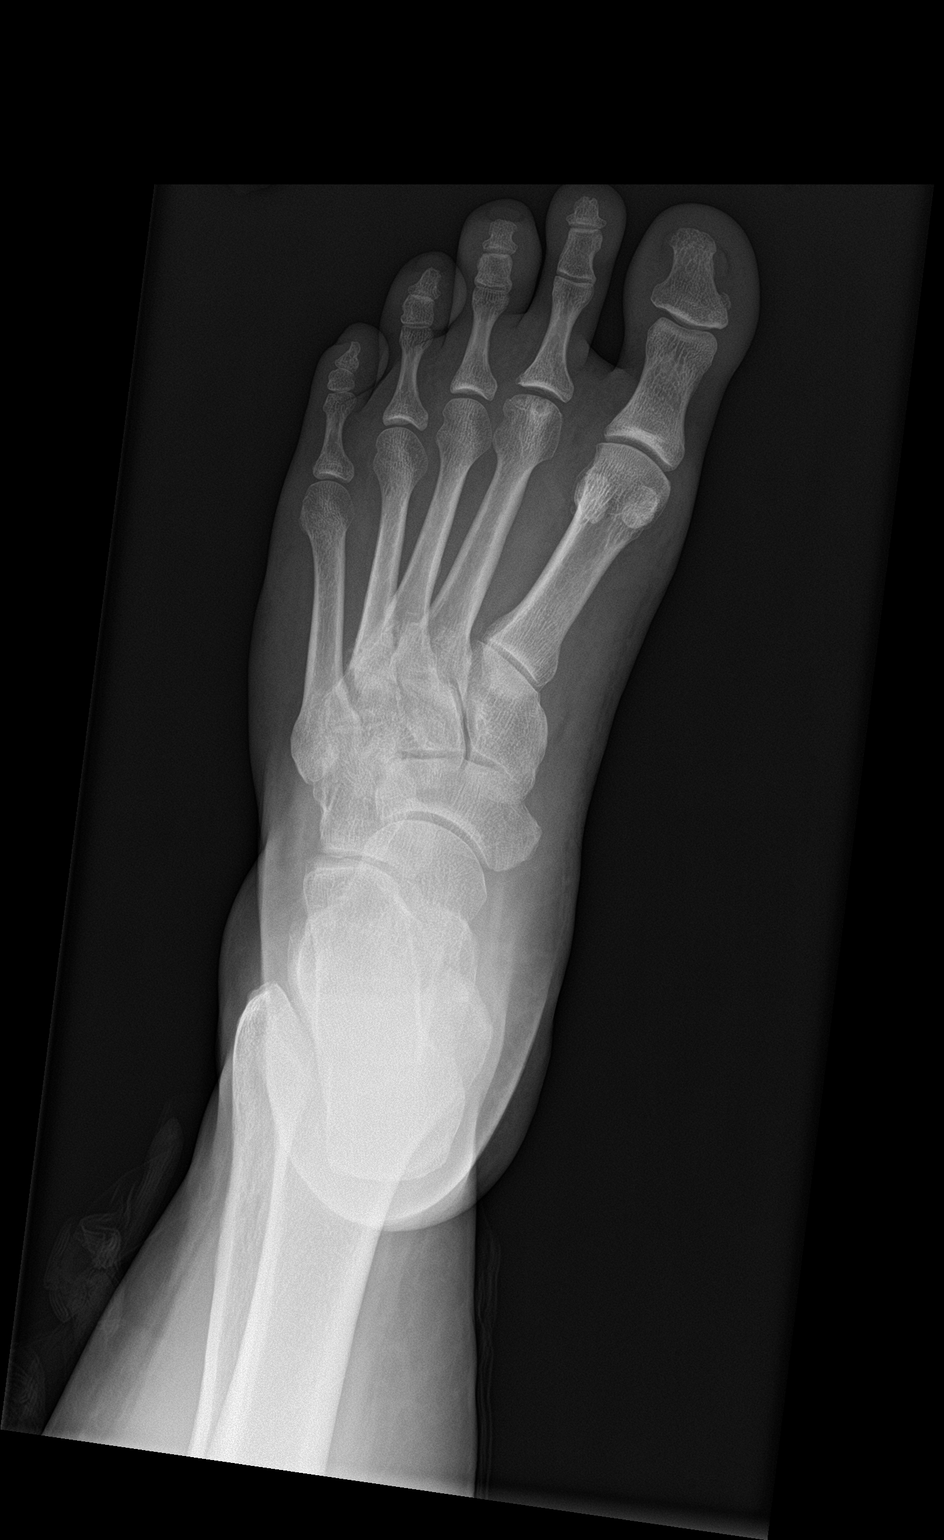

[foot obl]
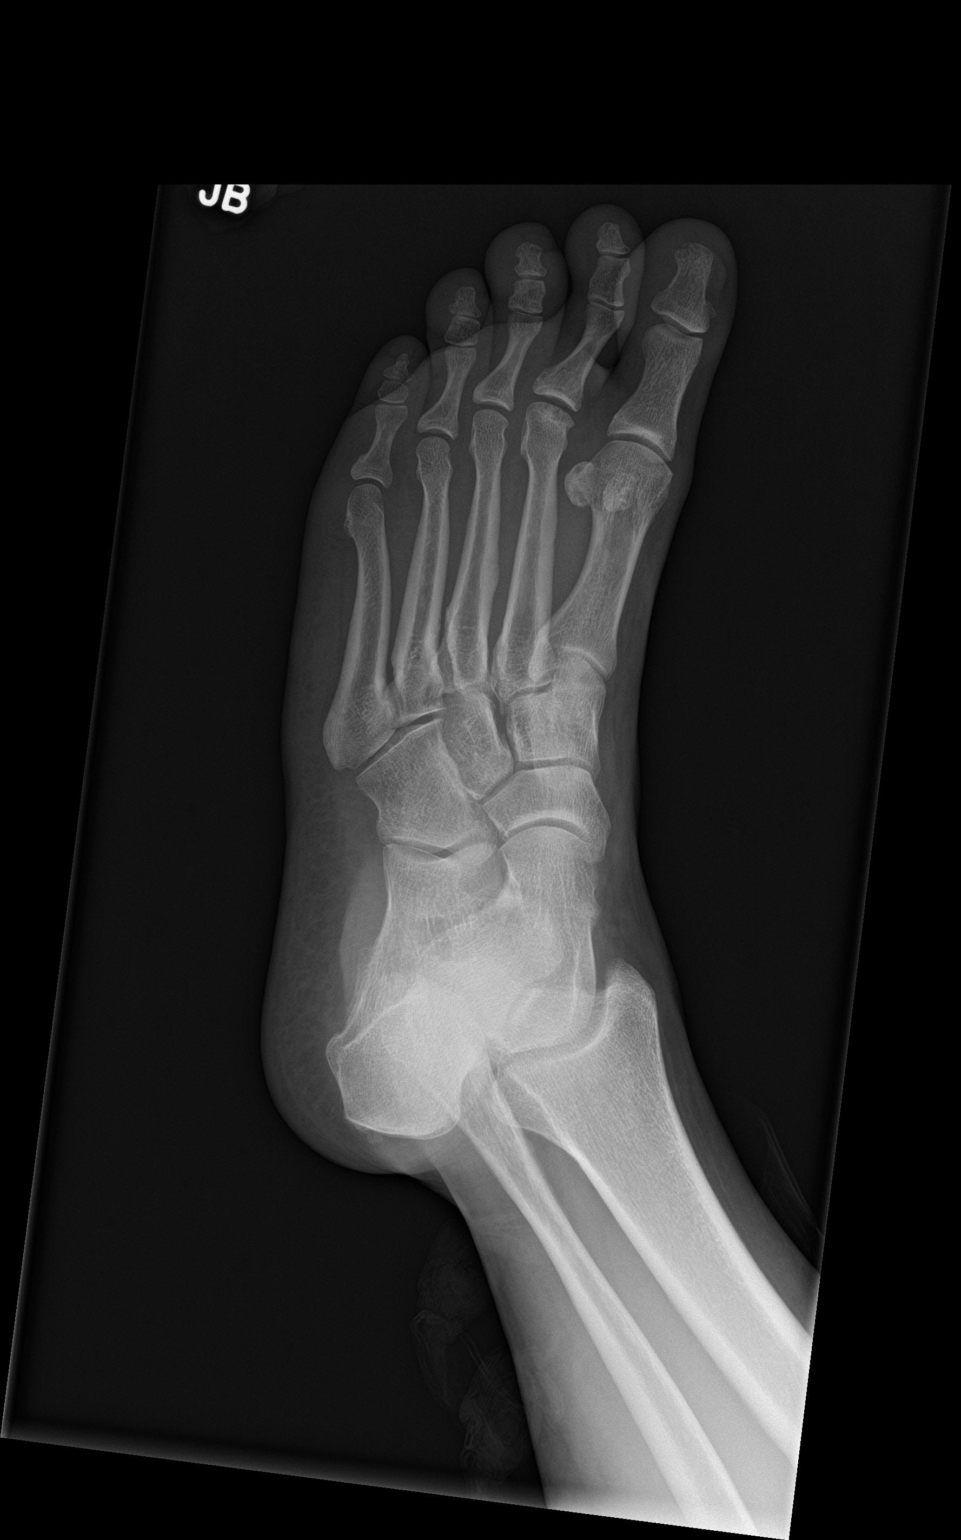

[foot lat]
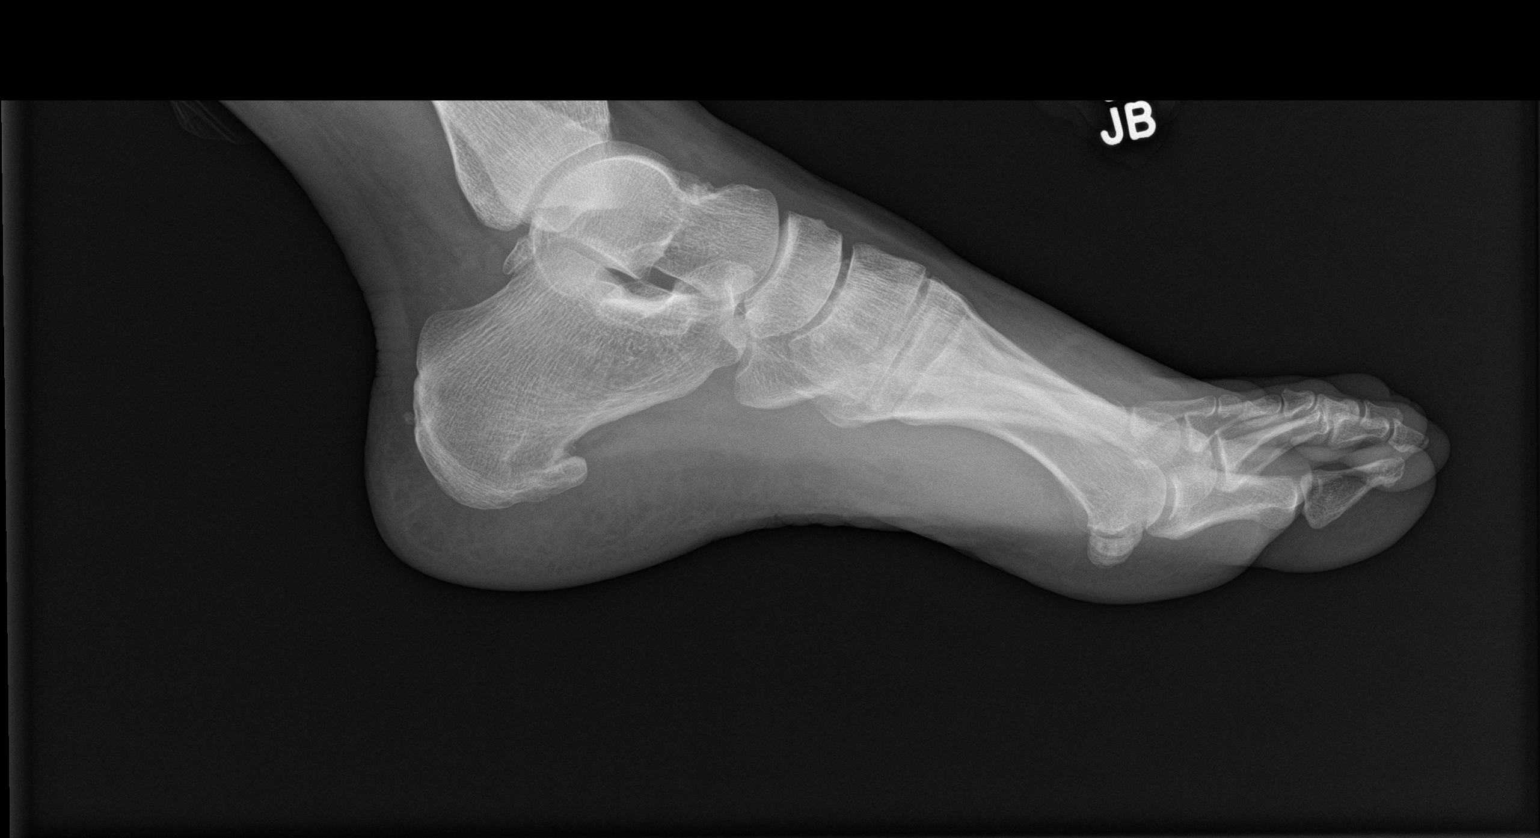

[3 of 3 positions shown; findings below may reference images not displayed]

FINDINGS: There is no evidence of fracture or dislocation. There is no
evidence of arthropathy. Mild posterior calcaneal spurring is noted.
Soft tissues are unremarkable.
IMPRESSION: Mild posterior calcaneal spurring.  No acute abnormality is noted.

## 2023-01-13 ENCOUNTER — Telehealth: Payer: 59 | Admitting: Psychiatry

## 2023-02-18 ENCOUNTER — Other Ambulatory Visit: Payer: Self-pay | Admitting: Psychiatry

## 2023-02-18 DIAGNOSIS — F4310 Post-traumatic stress disorder, unspecified: Secondary | ICD-10-CM

## 2023-02-18 DIAGNOSIS — F411 Generalized anxiety disorder: Secondary | ICD-10-CM

## 2023-02-18 NOTE — Telephone Encounter (Signed)
LF 08/05 ; LV 08/08; NV - needs scheduled

## 2023-02-18 NOTE — Telephone Encounter (Signed)
Please schedule pt appt.  

## 2023-02-20 ENCOUNTER — Other Ambulatory Visit (HOSPITAL_BASED_OUTPATIENT_CLINIC_OR_DEPARTMENT_OTHER): Payer: Self-pay

## 2023-02-22 ENCOUNTER — Other Ambulatory Visit: Payer: Self-pay

## 2023-02-22 ENCOUNTER — Telehealth: Payer: Self-pay | Admitting: Psychiatry

## 2023-02-22 DIAGNOSIS — F900 Attention-deficit hyperactivity disorder, predominantly inattentive type: Secondary | ICD-10-CM

## 2023-02-22 DIAGNOSIS — F50819 Binge eating disorder, unspecified: Secondary | ICD-10-CM

## 2023-02-22 MED ORDER — LISDEXAMFETAMINE DIMESYLATE 60 MG PO CAPS
60.0000 mg | ORAL_CAPSULE | Freq: Every day | ORAL | 0 refills | Status: DC
  Filled 2023-02-22 – 2023-03-29 (×2): qty 30, 30d supply, fill #0

## 2023-02-22 NOTE — Telephone Encounter (Signed)
Pt called and said that her pharmacy doesn;t have the vyvanse 60 mg in stock. Please cancel and send to the [facility] [place]. They do have it in stock

## 2023-02-22 NOTE — Telephone Encounter (Signed)
CANCELED AND PENDED

## 2023-02-23 ENCOUNTER — Other Ambulatory Visit (HOSPITAL_BASED_OUTPATIENT_CLINIC_OR_DEPARTMENT_OTHER): Payer: Self-pay

## 2023-02-23 ENCOUNTER — Other Ambulatory Visit: Payer: Self-pay

## 2023-02-25 NOTE — Telephone Encounter (Signed)
Pt is scheduled 12/12.

## 2023-03-05 ENCOUNTER — Other Ambulatory Visit (HOSPITAL_BASED_OUTPATIENT_CLINIC_OR_DEPARTMENT_OTHER): Payer: Self-pay

## 2023-03-29 ENCOUNTER — Other Ambulatory Visit (HOSPITAL_BASED_OUTPATIENT_CLINIC_OR_DEPARTMENT_OTHER): Payer: Self-pay

## 2023-03-30 ENCOUNTER — Other Ambulatory Visit: Payer: Self-pay

## 2023-04-01 ENCOUNTER — Other Ambulatory Visit: Payer: Self-pay | Admitting: Psychiatry

## 2023-04-01 DIAGNOSIS — F411 Generalized anxiety disorder: Secondary | ICD-10-CM

## 2023-04-01 DIAGNOSIS — F4310 Post-traumatic stress disorder, unspecified: Secondary | ICD-10-CM

## 2023-04-04 ENCOUNTER — Other Ambulatory Visit: Payer: Self-pay | Admitting: Psychiatry

## 2023-04-04 DIAGNOSIS — F4310 Post-traumatic stress disorder, unspecified: Secondary | ICD-10-CM

## 2023-04-04 DIAGNOSIS — F411 Generalized anxiety disorder: Secondary | ICD-10-CM

## 2023-04-05 ENCOUNTER — Other Ambulatory Visit: Payer: Self-pay

## 2023-04-05 DIAGNOSIS — F4310 Post-traumatic stress disorder, unspecified: Secondary | ICD-10-CM

## 2023-04-05 DIAGNOSIS — F411 Generalized anxiety disorder: Secondary | ICD-10-CM

## 2023-04-05 MED ORDER — BUSPIRONE HCL 30 MG PO TABS: 30.0000 mg | ORAL_TABLET | Freq: Two times a day (BID) | ORAL | 0 refills | Status: DC

## 2023-04-05 NOTE — Telephone Encounter (Signed)
Called pt to confirm what pharmacy she wanted her medication to go to. LVM TO [facility]

## 2023-04-05 NOTE — Telephone Encounter (Signed)
Pt called and would like buspar sent to [facility] in [place].

## 2023-04-09 ENCOUNTER — Other Ambulatory Visit (HOSPITAL_BASED_OUTPATIENT_CLINIC_OR_DEPARTMENT_OTHER): Payer: Self-pay

## 2023-04-15 ENCOUNTER — Ambulatory Visit: Payer: Medicare Other | Admitting: Psychiatry

## 2023-04-15 ENCOUNTER — Encounter: Payer: Self-pay | Admitting: Psychiatry

## 2023-04-15 DIAGNOSIS — F411 Generalized anxiety disorder: Secondary | ICD-10-CM | POA: Diagnosis not present

## 2023-04-15 DIAGNOSIS — F4310 Post-traumatic stress disorder, unspecified: Secondary | ICD-10-CM | POA: Diagnosis not present

## 2023-04-15 DIAGNOSIS — F50819 Binge eating disorder, unspecified: Secondary | ICD-10-CM | POA: Diagnosis not present

## 2023-04-15 DIAGNOSIS — G4721 Circadian rhythm sleep disorder, delayed sleep phase type: Secondary | ICD-10-CM

## 2023-04-15 DIAGNOSIS — F5105 Insomnia due to other mental disorder: Secondary | ICD-10-CM

## 2023-04-15 DIAGNOSIS — F331 Major depressive disorder, recurrent, moderate: Secondary | ICD-10-CM | POA: Diagnosis not present

## 2023-04-15 DIAGNOSIS — F900 Attention-deficit hyperactivity disorder, predominantly inattentive type: Secondary | ICD-10-CM
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
## 2023-05-14 ENCOUNTER — Other Ambulatory Visit: Payer: Self-pay | Admitting: Psychiatry

## 2023-05-14 DIAGNOSIS — F4310 Post-traumatic stress disorder, unspecified: Secondary | ICD-10-CM

## 2023-05-14 DIAGNOSIS — F411 Generalized anxiety disorder: Secondary | ICD-10-CM

## 2023-05-16 ENCOUNTER — Other Ambulatory Visit: Payer: Self-pay | Admitting: Psychiatry

## 2023-05-16 DIAGNOSIS — F411 Generalized anxiety disorder: Secondary | ICD-10-CM

## 2023-05-16 DIAGNOSIS — F4310 Post-traumatic stress disorder, unspecified: Secondary | ICD-10-CM

## 2023-06-27 ENCOUNTER — Other Ambulatory Visit: Payer: Self-pay | Admitting: Psychiatry

## 2023-06-27 DIAGNOSIS — F4310 Post-traumatic stress disorder, unspecified: Secondary | ICD-10-CM

## 2023-06-27 DIAGNOSIS — F5105 Insomnia due to other mental disorder: Secondary | ICD-10-CM

## 2023-07-09 ENCOUNTER — Other Ambulatory Visit: Payer: Self-pay | Admitting: Psychiatry

## 2023-07-09 DIAGNOSIS — F4310 Post-traumatic stress disorder, unspecified: Secondary | ICD-10-CM

## 2023-07-09 DIAGNOSIS — F411 Generalized anxiety disorder: Secondary | ICD-10-CM

## 2023-07-09 NOTE — Telephone Encounter (Signed)
 Pt called at 3:53p after having the pharmacy send in request for Buspar.  She said she cannot find 2 bottles (she thinks she had 3 bottles of 60 pills).  She said she has torn her house up looking for them.   She said she is going to continue to look for them but she found one last night to take and she has insomnia.  Pls send to   [id number] - [place], [place] - [address] [facility] [address], [place] [address] Phone: [phone]  Fax: [phone]    Next appt 6/12

## 2023-07-24 ENCOUNTER — Other Ambulatory Visit: Payer: Self-pay | Admitting: Psychiatry

## 2023-07-24 DIAGNOSIS — F411 Generalized anxiety disorder: Secondary | ICD-10-CM

## 2023-07-24 DIAGNOSIS — F4310 Post-traumatic stress disorder, unspecified: Secondary | ICD-10-CM

## 2023-07-25 NOTE — Telephone Encounter (Signed)
Verify pharmacy 

## 2023-07-26 NOTE — Telephone Encounter (Signed)
 LVM per DPR asking what is the correct pharmacy

## 2023-08-25 ENCOUNTER — Telehealth: Payer: Self-pay | Admitting: Psychiatry

## 2023-08-25 NOTE — Telephone Encounter (Signed)
 Pt called checking to see if we received  a PA on her seroquel. It is a quantity issue

## 2023-08-27 NOTE — Telephone Encounter (Signed)
 PA received for Sertraline  100 mg takes 2.5 tablets daily #75  No other PA received

## 2023-09-01 NOTE — Telephone Encounter (Signed)
 Prior approval received effective through 08/26/24 for Sertraline  100 mg tablets #75 with [employer]

## 2023-10-14 ENCOUNTER — Ambulatory Visit: Payer: Medicare Other | Admitting: Psychiatry

## 2023-12-02 ENCOUNTER — Ambulatory Visit (INDEPENDENT_AMBULATORY_CARE_PROVIDER_SITE_OTHER): Admitting: Psychiatry

## 2023-12-02 ENCOUNTER — Encounter: Payer: Self-pay | Admitting: Psychiatry

## 2023-12-02 DIAGNOSIS — F5105 Insomnia due to other mental disorder: Secondary | ICD-10-CM

## 2023-12-02 DIAGNOSIS — F50819 Binge eating disorder, unspecified: Secondary | ICD-10-CM | POA: Diagnosis not present

## 2023-12-02 DIAGNOSIS — F331 Major depressive disorder, recurrent, moderate: Secondary | ICD-10-CM

## 2023-12-02 DIAGNOSIS — F411 Generalized anxiety disorder: Secondary | ICD-10-CM | POA: Diagnosis not present

## 2023-12-02 DIAGNOSIS — F4310 Post-traumatic stress disorder, unspecified: Secondary | ICD-10-CM | POA: Diagnosis not present

## 2023-12-02 DIAGNOSIS — F900 Attention-deficit hyperactivity disorder, predominantly inattentive type: Secondary | ICD-10-CM

## 2023-12-02 MED ORDER — LISDEXAMFETAMINE DIMESYLATE 50 MG PO CAPS: 50.0000 mg | ORAL_CAPSULE | ORAL | 0 refills | Status: DC

## 2023-12-02 MED ORDER — LISDEXAMFETAMINE DIMESYLATE 50 MG PO CAPS: 50.0000 mg | ORAL_CAPSULE | Freq: Every day | ORAL | 0 refills | Status: DC
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
## 2023-12-26 ENCOUNTER — Other Ambulatory Visit: Payer: Self-pay | Admitting: Psychiatry

## 2023-12-26 DIAGNOSIS — F5105 Insomnia due to other mental disorder: Secondary | ICD-10-CM

## 2023-12-26 DIAGNOSIS — F4310 Post-traumatic stress disorder, unspecified: Secondary | ICD-10-CM

## 2024-01-10 ENCOUNTER — Telehealth: Payer: Self-pay | Admitting: Psychiatry

## 2024-01-10 ENCOUNTER — Other Ambulatory Visit: Payer: Self-pay

## 2024-01-10 DIAGNOSIS — F50819 Binge eating disorder, unspecified: Secondary | ICD-10-CM

## 2024-01-10 DIAGNOSIS — F900 Attention-deficit hyperactivity disorder, predominantly inattentive type: Secondary | ICD-10-CM

## 2024-01-10 MED ORDER — LISDEXAMFETAMINE DIMESYLATE 50 MG PO CAPS: 50.0000 mg | ORAL_CAPSULE | Freq: Every day | ORAL | 0 refills | Status: DC

## 2024-01-10 NOTE — Telephone Encounter (Signed)
 Pt called and said that she needs her generic vyvanse  cancelled locally. Please send a new script to the [facility] located at [address] [place]

## 2024-01-10 NOTE — Telephone Encounter (Signed)
 Canceled RF at [facility] and repended to [facility] in [place].

## 2024-01-29 ENCOUNTER — Other Ambulatory Visit: Payer: Self-pay | Admitting: Psychiatry

## 2024-01-29 DIAGNOSIS — F4310 Post-traumatic stress disorder, unspecified: Secondary | ICD-10-CM

## 2024-01-29 DIAGNOSIS — F411 Generalized anxiety disorder: Secondary | ICD-10-CM

## 2024-01-31 ENCOUNTER — Other Ambulatory Visit: Payer: Self-pay | Admitting: Psychiatry

## 2024-01-31 DIAGNOSIS — F4310 Post-traumatic stress disorder, unspecified: Secondary | ICD-10-CM

## 2024-01-31 DIAGNOSIS — F411 Generalized anxiety disorder: Secondary | ICD-10-CM

## 2024-02-03 ENCOUNTER — Other Ambulatory Visit: Payer: Self-pay | Admitting: Psychiatry

## 2024-02-03 DIAGNOSIS — F411 Generalized anxiety disorder: Secondary | ICD-10-CM

## 2024-02-03 DIAGNOSIS — F4310 Post-traumatic stress disorder, unspecified: Secondary | ICD-10-CM

## 2024-02-14 ENCOUNTER — Telehealth: Payer: Self-pay | Admitting: Psychiatry

## 2024-02-14 ENCOUNTER — Other Ambulatory Visit: Payer: Self-pay

## 2024-02-14 DIAGNOSIS — F50819 Binge eating disorder, unspecified: Secondary | ICD-10-CM

## 2024-02-14 DIAGNOSIS — F900 Attention-deficit hyperactivity disorder, predominantly inattentive type: Secondary | ICD-10-CM

## 2024-02-14 MED ORDER — LISDEXAMFETAMINE DIMESYLATE 50 MG PO CAPS: 50.0000 mg | ORAL_CAPSULE | Freq: Every day | ORAL | 0 refills | Status: AC

## 2024-02-14 MED ORDER — LISDEXAMFETAMINE DIMESYLATE 50 MG PO CAPS: 50.0000 mg | ORAL_CAPSULE | ORAL | 0 refills | Status: AC

## 2024-02-14 NOTE — Telephone Encounter (Signed)
 [id number] - [place], [place] - [address]     Needs rf of Vyvanse 

## 2024-02-14 NOTE — Telephone Encounter (Signed)
 Pended

## 2024-04-28 ENCOUNTER — Other Ambulatory Visit: Payer: Self-pay | Admitting: Psychiatry

## 2024-04-28 DIAGNOSIS — F4310 Post-traumatic stress disorder, unspecified: Secondary | ICD-10-CM

## 2024-04-28 DIAGNOSIS — F411 Generalized anxiety disorder: Secondary | ICD-10-CM

## 2024-05-15 ENCOUNTER — Other Ambulatory Visit: Payer: Self-pay | Admitting: Psychiatry

## 2024-05-15 DIAGNOSIS — F411 Generalized anxiety disorder: Secondary | ICD-10-CM

## 2024-05-15 DIAGNOSIS — F4310 Post-traumatic stress disorder, unspecified: Secondary | ICD-10-CM

## 2024-05-31 ENCOUNTER — Ambulatory Visit (INDEPENDENT_AMBULATORY_CARE_PROVIDER_SITE_OTHER): Admitting: Psychiatry

## 2024-07-11 ENCOUNTER — Ambulatory Visit: Admitting: Psychiatry
